# Patient Record
Sex: Female | Born: 1980 | Race: Black or African American | Hispanic: No | Smoking: Former smoker
Health system: Southern US, Community
[De-identification: ages and names within clinical notes are randomized; demographics above are authoritative.]

## PROBLEM LIST (undated history)

## (undated) DIAGNOSIS — M1990 Unspecified osteoarthritis, unspecified site: Secondary | ICD-10-CM

## (undated) DIAGNOSIS — F32A Depression, unspecified: Secondary | ICD-10-CM

## (undated) DIAGNOSIS — F419 Anxiety disorder, unspecified: Secondary | ICD-10-CM

## (undated) DIAGNOSIS — F329 Major depressive disorder, single episode, unspecified: Secondary | ICD-10-CM

## (undated) DIAGNOSIS — R3915 Urgency of urination: Secondary | ICD-10-CM

## (undated) HISTORY — DX: Unspecified osteoarthritis, unspecified site: M19.90

## (undated) HISTORY — DX: Urgency of urination: R39.15

## (undated) HISTORY — DX: Major depressive disorder, single episode, unspecified: F32.9

## (undated) HISTORY — DX: Depression, unspecified: F32.A

## (undated) HISTORY — PX: NO PAST SURGERIES: SHX2092

## (undated) HISTORY — PX: OTHER SURGICAL HISTORY: SHX169

---

## 2005-06-10 ENCOUNTER — Emergency Department: Payer: Self-pay | Admitting: Emergency Medicine

## 2005-06-17 ENCOUNTER — Emergency Department: Payer: Self-pay | Admitting: Emergency Medicine

## 2006-02-05 ENCOUNTER — Ambulatory Visit: Payer: Self-pay | Admitting: *Deleted

## 2013-10-07 ENCOUNTER — Emergency Department: Payer: Self-pay | Admitting: Internal Medicine

## 2014-03-04 LAB — HM PAP SMEAR: HM PAP: NEGATIVE

## 2015-09-02 ENCOUNTER — Encounter: Payer: Self-pay | Admitting: Urology

## 2015-09-02 ENCOUNTER — Ambulatory Visit (INDEPENDENT_AMBULATORY_CARE_PROVIDER_SITE_OTHER): Payer: BLUE CROSS/BLUE SHIELD | Admitting: Urology

## 2015-09-02 VITALS — BP 121/83 | HR 77 | Ht 62.0 in | Wt 151.0 lb

## 2015-09-02 DIAGNOSIS — N3941 Urge incontinence: Secondary | ICD-10-CM

## 2015-09-02 DIAGNOSIS — R3129 Other microscopic hematuria: Secondary | ICD-10-CM

## 2015-09-02 DIAGNOSIS — M6289 Other specified disorders of muscle: Secondary | ICD-10-CM

## 2015-09-02 DIAGNOSIS — N8184 Pelvic muscle wasting: Secondary | ICD-10-CM | POA: Diagnosis not present

## 2015-09-02 LAB — MICROSCOPIC EXAMINATION
Bacteria, UA: NONE SEEN
WBC UA: NONE SEEN /HPF (ref 0–?)

## 2015-09-02 LAB — URINALYSIS, COMPLETE
Bilirubin, UA: NEGATIVE
GLUCOSE, UA: NEGATIVE
Leukocytes, UA: NEGATIVE
NITRITE UA: NEGATIVE
Protein, UA: NEGATIVE
Specific Gravity, UA: 1.02 (ref 1.005–1.030)
UUROB: 0.2 mg/dL (ref 0.2–1.0)
pH, UA: 6.5 (ref 5.0–7.5)

## 2015-09-02 LAB — BLADDER SCAN AMB NON-IMAGING

## 2015-09-02 MED ORDER — MIRABEGRON ER 25 MG PO TB24: 25.0000 mg | ORAL_TABLET | Freq: Every day | ORAL | Status: DC

## 2015-09-02 NOTE — Progress Notes (Signed)
09/02/2015 10:44 AM   Cindy QuestMicaela Bonilla 05/15/1981 [id number]  Referring provider: No referring provider defined for this encounter.  Chief Complaint  Patient presents with  . Urge Incontinence    New Patient    HPI: 35 yo who presents today for further evaluation/treatment of urinary urgency, frequency, and difficulty urinating. She was treated by her primary care physician started approximately one year ago with oxybutynin for urinary urgency and frequency which she continues today. This has helped significantly with those issues and she rarely has issues with this now.  Her primary complaint today includes inability to start her urinary stream and empty her bladder at times. She specifies, this is primarily when she is away from home and gets distracted by noises or other people in public bathrooms. She becomes quite anxious about this. She sometimes has hesitancy and can spend up to half an hour in the bathroom at times waiting to start her stream.  This especially problematic at work. Once she is able to void, she does feel like she is able to empty her bladder. She reports that this is been going on for quite some time and prior to starting oxybutynin.  She does have issues with dry mouth and constipation from the oxybutynin. She does have soft, bulky stools but not every day. She tries to drink enough water.  No history of urinary tract infections or gross hematuria. No dysuria.  She is sexually active and no pain with intercourse. No history of bulging or perineal pressure. She has had one normal spontaneous vaginal delivery.  She has been told that she's had blood in her urine on analysis in the past but records aren't available today.  She does describe herself as a anxious, "type A' personality type. She likes to be in control and    PMH: Past Medical History  Diagnosis Date  . Arthritis     Surgical History: Past Surgical History  Procedure Laterality Date  . None        Home Medications:    Medication List       This list is accurate as of: 09/02/15 11:59 PM.  Always use your most recent med list.               CRYSELLE-28 PO  Take by mouth.     diclofenac 75 MG EC tablet  Commonly known as:  VOLTAREN     methylPREDNISolone 4 MG Tbpk tablet  Commonly known as:  MEDROL DOSEPAK  take as directed ON DOSE PACK.     mirabegron ER 25 MG Tb24 tablet  Commonly known as:  MYRBETRIQ  Take 1 tablet (25 mg total) by mouth daily.     oxybutynin 5 MG tablet  Commonly known as:  DITROPAN  Take 5 mg by mouth 3 (three) times daily.        Allergies: No Known Allergies  Family History: Family History  Problem Relation Age of Onset  . Bladder Cancer Neg Hx   . Prostate cancer Neg Hx   . Kidney cancer Neg Hx     Social History:  reports that she has been smoking.  She does not have any smokeless tobacco history on file. She reports that she drinks alcohol. She reports that she does not use illicit drugs.  ROS: UROLOGY Frequent Urination?: Yes Hard to postpone urination?: Yes Burning/pain with urination?: No Get up at night to urinate?: No Leakage of urine?: Yes Urine stream starts and stops?: Yes Trouble starting stream?: Yes  Do you have to strain to urinate?: Yes Blood in urine?: Yes Urinary tract infection?: No Sexually transmitted disease?: No Injury to kidneys or bladder?: No Painful intercourse?: No Weak stream?: No Currently pregnant?: No Vaginal bleeding?: Yes Last menstrual period?: 08/19/15  Gastrointestinal Nausea?: No Vomiting?: No Indigestion/heartburn?: No Diarrhea?: No Constipation?: No  Constitutional Fever: No Night sweats?: No Weight loss?: No Fatigue?: No  Skin Skin rash/lesions?: No Itching?: No  Eyes Blurred vision?: No Double vision?: No  Ears/Nose/Throat Sore throat?: No Sinus problems?: No  Hematologic/Lymphatic Swollen glands?: No Easy bruising?: No  Cardiovascular Leg swelling?:  Yes Chest pain?: No  Respiratory Cough?: No Shortness of breath?: No  Endocrine Excessive thirst?: Yes  Musculoskeletal Back pain?: Yes Joint pain?: Yes  Neurological Headaches?: No Dizziness?: No  Psychologic Depression?: No Anxiety?: No  Physical Exam: BP 121/83 mmHg  Pulse 77  Ht  (1.575 m)  Wt 151 lb (68.493 kg)  BMI 27.61 kg/m2  LMP 08/19/2015 (Approximate)  Constitutional:  Alert and oriented, No acute distress. HEENT: [place] AT, moist mucus membranes.  Trachea midline, no masses. Cardiovascular: No clubbing, cyanosis, or edema. Respiratory: Normal respiratory effort, no increased work of breathing. GI: Abdomen is soft, nontender, nondistended, no abdominal masses GU: No CVA tenderness.  Skin: No rashes, bruises or suspicious lesions. Lymph: No cervical or inguinal adenopathy. Neurologic: Grossly intact, no focal deficits, moving all 4 extremities. Psychiatric: Normal mood and affect.  Laboratory Data: N/a  Urinalysis Results for orders placed or performed in visit on 09/02/15  Microscopic Examination  Result Value Ref Range   WBC, UA None seen 0 -  5 /hpf   RBC, UA 3-10 (A) 0 -  2 /hpf   Epithelial Cells (non renal) 0-10 0 - 10 /hpf   Bacteria, UA None seen None seen/Few  Urinalysis, Complete  Result Value Ref Range   Specific Gravity, UA 1.020 1.005 - 1.030   pH, UA 6.5 5.0 - 7.5   Color, UA Yellow Yellow   Appearance Ur Clear Clear   Leukocytes, UA Negative Negative   Protein, UA Negative Negative/Trace   Glucose, UA Negative Negative   Ketones, UA Trace (A) Negative   RBC, UA Trace (A) Negative   Bilirubin, UA Negative Negative   Urobilinogen, Ur 0.2 0.2 - 1.0 mg/dL   Nitrite, UA Negative Negative   Microscopic Examination See below:   BLADDER SCAN AMB NON-IMAGING  Result Value Ref Range   Scan Result 12ml     Assessment & Plan:    1. Pelvic floor dysfunction Based on her symptoms including difficulty starting her urinary stream and  pelvic, I suspect she has some degree of pelvic floor dysfunction. I would like to refer her to physical therapy for pelvic floor evaluation and treatment. We discussed this at length today. She is agreeable with this plan. - Ambulatory referral to Physical Therapy  2. Urge urinary incontinence Improvement with anticholinergics but side effects including dry mouth and constipation. Will change medication tip Mybetriq with dissimilar side effect profile.  No evidence of incomplete bladder emptying. - Urinalysis, Complete - BLADDER SCAN AMB NON-IMAGING  3. Microscopic hematuria Incidental microscopic hematuria and young female but does have smoking history.  Will recommend repeating a UA to confirm presence and consider hematuria workup thereafter pending results.  -repeat UA   Return if symptoms worsen or fail to improve.  Vanna Scotland, MD  [facility] [address], [address] [place], [place] [address] [phone]

## 2015-09-04 ENCOUNTER — Telehealth: Payer: Self-pay | Admitting: Urology

## 2015-09-04 NOTE — Telephone Encounter (Signed)
Please let this patient knows that she did have some incidental microscopic hematuria and her urine on Friday. I would like to have her stop by and drop off another urine to confirm the presence of this. If it remains present in her urine on 2 separate occasions and the absence of infection, I will recommend proceeding with a hematuria workup which will include cystoscopy and CT scan.  Vanna ScotlandAshley Elisa Sorlie, MD

## 2015-09-05 NOTE — Telephone Encounter (Signed)
Left pt mess/SW 

## 2015-09-06 ENCOUNTER — Ambulatory Visit
Admission: RE | Admit: 2015-09-06 | Discharge: 2015-09-06 | Disposition: A | Discharge status: Home / Self Care | Payer: BLUE CROSS/BLUE SHIELD | Source: Ambulatory Visit | Attending: Urology | Admitting: Urology

## 2015-09-06 ENCOUNTER — Encounter: Payer: Self-pay | Admitting: Urology

## 2015-09-06 ENCOUNTER — Telehealth: Payer: Self-pay

## 2015-09-06 ENCOUNTER — Ambulatory Visit (INDEPENDENT_AMBULATORY_CARE_PROVIDER_SITE_OTHER): Payer: BLUE CROSS/BLUE SHIELD

## 2015-09-06 DIAGNOSIS — R3989 Other symptoms and signs involving the genitourinary system: Secondary | ICD-10-CM

## 2015-09-06 DIAGNOSIS — N3289 Other specified disorders of bladder: Secondary | ICD-10-CM | POA: Diagnosis not present

## 2015-09-06 DIAGNOSIS — R3129 Other microscopic hematuria: Secondary | ICD-10-CM | POA: Diagnosis present

## 2015-09-06 DIAGNOSIS — R32 Unspecified urinary incontinence: Secondary | ICD-10-CM | POA: Diagnosis not present

## 2015-09-06 LAB — MICROSCOPIC EXAMINATION
BACTERIA UA: NONE SEEN
WBC UA: NONE SEEN /HPF (ref 0–?)

## 2015-09-06 LAB — URINALYSIS, COMPLETE
Bilirubin, UA: NEGATIVE
Glucose, UA: NEGATIVE
Ketones, UA: NEGATIVE
Leukocytes, UA: NEGATIVE
NITRITE UA: NEGATIVE
PH UA: 6 (ref 5.0–7.5)
Protein, UA: NEGATIVE
SPEC GRAV UA: 1.015 (ref 1.005–1.030)
UUROB: 0.2 mg/dL (ref 0.2–1.0)

## 2015-09-06 LAB — BLADDER SCAN AMB NON-IMAGING: Scan Result: 0

## 2015-09-06 NOTE — Telephone Encounter (Signed)
Patient dropped off urine at office today after PVR

## 2015-09-06 NOTE — Telephone Encounter (Signed)
Spoke with patient per Dr. Apolinar JunesBrandon and notified her that her UA today still showed 3-10 RBC and we would need to do a hematuria evaluation which would include a CTscan and cysto to follow. Patient states she understands but is having worsening pain in her kidney area. Her UA today showed no sign of infection per Dr. Apolinar JunesBrandon patient should go for a KUB to rule out possible stone. Patient is in agreeance with this plan and we will call with KUB results.

## 2015-09-06 NOTE — Progress Notes (Signed)
Bladder Scan Patient can void:  Performed By: Rupert Stackshelsea Redonna Wilbert, LPN  Pt called c/o increased urinary frequency since starting myrbetriq. Pt c/o having to go to the bathroom every 15 min. Made pt aware would send Dr. Apolinar JunesBrandon a message and get back in touch with her. Pt voiced understanding.

## 2015-09-07 ENCOUNTER — Telehealth: Payer: Self-pay

## 2015-09-07 DIAGNOSIS — R3129 Other microscopic hematuria: Secondary | ICD-10-CM

## 2015-09-07 NOTE — Telephone Encounter (Signed)
No. Pt had a KUB to rule out kidney stones.

## 2015-09-07 NOTE — Telephone Encounter (Signed)
-----   Message from Harle BattiestShannon A McGowan, PA-C sent at 09/07/2015 10:28 AM EDT ----- Patient has microscopic hematuria again on this repeated UA.  She does also have yeast which could be a contaminate.  Is she having any vaginal itching or discharge?

## 2015-09-07 NOTE — Telephone Encounter (Signed)
Cindy SagoSarah spoke with pt in reference to KUB results.

## 2015-09-07 NOTE — Telephone Encounter (Signed)
I have made these appointments for her and she has been notified  michelle

## 2015-09-07 NOTE — Telephone Encounter (Signed)
-----   Message from Vanna ScotlandAshley Brandon, MD sent at 09/06/2015  5:09 PM EDT ----- No stones.  She does look a little constipated. Please let her know.  Vanna ScotlandAshley Brandon, MD

## 2015-09-16 ENCOUNTER — Ambulatory Visit
Admission: RE | Admit: 2015-09-16 | Discharge: 2015-09-16 | Disposition: A | Discharge status: Home / Self Care | Payer: BLUE CROSS/BLUE SHIELD | Source: Ambulatory Visit | Attending: Urology | Admitting: Urology

## 2015-09-16 ENCOUNTER — Telehealth: Payer: Self-pay | Admitting: Urology

## 2015-09-16 ENCOUNTER — Encounter: Payer: Self-pay | Admitting: Urology

## 2015-09-16 DIAGNOSIS — R3129 Other microscopic hematuria: Secondary | ICD-10-CM | POA: Insufficient documentation

## 2015-09-16 MED ORDER — IOPAMIDOL (ISOVUE-300) INJECTION 61%
150.0000 mL | Freq: Once | INTRAVENOUS | Status: AC | PRN
  Administered 2015-09-16: 150 mL via INTRAVENOUS

## 2015-09-16 NOTE — Telephone Encounter (Signed)
-----   Message from Vanna ScotlandAshley Brandon, MD sent at 09/16/2015  2:09 PM EDT ----- Please note this patient noted that her CT scan looks good. We'll see her in a few weeks for her cystoscopy.  Vanna ScotlandAshley Brandon, MD

## 2015-09-16 NOTE — Telephone Encounter (Signed)
Left pt mess to call/SW 

## 2015-09-16 NOTE — Telephone Encounter (Signed)
Pt came in asking for work note excusing her from 3/22-3/24 due to medication making her sick.  I spoke with Dr. Apolinar JunesBrandon, who advised pt to discontinue all medications and come in 4/7 as scheduled for her cysto.  I gave pt note excusing her from work on the requested dates.  NO more work notes should be given per Dr. Apolinar JunesBrandon!  TL

## 2015-09-18 NOTE — Telephone Encounter (Signed)
I like to check an UA and culture again to see if the hematuria has resolved.

## 2015-09-19 NOTE — Telephone Encounter (Signed)
Spoke with pt in reference to u/a and ucx. Pt stated that she will come by the office on Wednesday morning to give another urine. Orders placed.

## 2015-09-19 NOTE — Telephone Encounter (Signed)
Patient notified/SW 

## 2015-09-19 NOTE — Addendum Note (Signed)
Addended by: Skeet LatchWATKINS, Azul Coffie C on: 09/19/2015 03:27 PM   Modules accepted: Orders

## 2015-09-21 ENCOUNTER — Other Ambulatory Visit: Payer: BLUE CROSS/BLUE SHIELD

## 2015-09-21 DIAGNOSIS — R3129 Other microscopic hematuria: Secondary | ICD-10-CM

## 2015-09-21 LAB — URINALYSIS, COMPLETE
BILIRUBIN UA: NEGATIVE
Glucose, UA: NEGATIVE
Ketones, UA: NEGATIVE
LEUKOCYTES UA: NEGATIVE
Nitrite, UA: NEGATIVE
PH UA: 5 (ref 5.0–7.5)
PROTEIN UA: NEGATIVE
Specific Gravity, UA: <1.005 — ABNORMAL LOW (ref 1.005–1.030)
Urobilinogen, Ur: 0.2 mg/dL (ref 0.2–1.0)

## 2015-09-21 LAB — MICROSCOPIC EXAMINATION
BACTERIA UA: NONE SEEN
Epithelial Cells (non renal): NONE SEEN /hpf (ref 0–10)
WBC, UA: NONE SEEN /hpf (ref 0–?)

## 2015-09-23 ENCOUNTER — Other Ambulatory Visit: Payer: BLUE CROSS/BLUE SHIELD | Admitting: Urology

## 2015-09-23 ENCOUNTER — Other Ambulatory Visit: Payer: BLUE CROSS/BLUE SHIELD

## 2015-09-23 LAB — CULTURE, URINE COMPREHENSIVE

## 2015-09-28 ENCOUNTER — Encounter: Payer: Self-pay | Admitting: Family Medicine

## 2015-09-28 ENCOUNTER — Encounter: Payer: Self-pay | Admitting: *Deleted

## 2015-09-28 ENCOUNTER — Ambulatory Visit (INDEPENDENT_AMBULATORY_CARE_PROVIDER_SITE_OTHER): Payer: BLUE CROSS/BLUE SHIELD | Admitting: Family Medicine

## 2015-09-28 VITALS — BP 110/70 | HR 71 | Temp 98.8°F | Resp 16 | Wt 150.0 lb

## 2015-09-28 DIAGNOSIS — R55 Syncope and collapse: Secondary | ICD-10-CM

## 2015-09-28 DIAGNOSIS — L02811 Cutaneous abscess of head [any part, except face]: Secondary | ICD-10-CM | POA: Diagnosis not present

## 2015-09-28 MED ORDER — ACETAMINOPHEN 500 MG PO TABS: 500.0000 mg | ORAL_TABLET | Freq: Four times a day (QID) | ORAL | Status: DC | PRN

## 2015-09-28 MED ORDER — DOXYCYCLINE HYCLATE 100 MG PO TABS: 100.0000 mg | ORAL_TABLET | Freq: Two times a day (BID) | ORAL | Status: DC

## 2015-09-28 NOTE — Patient Instructions (Addendum)
Monday 1030 Am at [facility]. Be there at 10am to do paper work.   Take doxycycline twice daily for 10 days to treat infection. Keep using warm compresses to help it drain.

## 2015-09-28 NOTE — Progress Notes (Signed)
Subjective:    Patient ID: Cindy Bonilla, female    DOB: 07-Aug-1980, 35 y.o.   MRN: [id number]  HPI: Cindy Bonilla is a 35 y.o. female presenting on 09/28/2015 for Recurrent Skin Infections   HPI  Pt presents as new patient for a sick visit. She has a boil on the R side of top of her head. Draining purulent fluid- 2-3 days. Getting larger. Very painful. Know having knot and pain behind her ear. Started as a pimple. Has never had symptoms like this before.   Previous provider was [facility]- She was seeing them for Urge Incontinence [facility] but her on oxybutynin for bladder issues. Urinary urgency. She did a see Dr.Brandon at [facility]. See was placed on mybetriq. She reports she faint at work like she would pass out x2 last week. Felt dizzy and short of breath prior to passing out. Did not hit her head. She thought it was medications. Stopped all medications and now feels better. No further episodes of syncope.  Records from [facility] have been requested and will be reviewed.   Past Medical History  Diagnosis Date  . Arthritis   . Urinary urgency   . Depression    Social History   Social History  . Marital Status: Single    Spouse Name: N/A  . Number of Children: N/A  . Years of Education: N/A   Occupational History  . Not on file.   Social History Main Topics  . Smoking status: Current Some Day Smoker  . Smokeless tobacco: Never Used  . Alcohol Use: 0.0 oz/week    0 Standard drinks or equivalent per week     Comment: occasional  . Drug Use: No  . Sexual Activity: Not on file   Other Topics Concern  . Not on file   Social History Narrative   Family History  Problem Relation Age of Onset  . Bladder Cancer Neg Hx   . Prostate cancer Neg Hx   . Kidney cancer Neg Hx   . Diabetes Mother   . Depression Mother   . Aneurysm Brother    Current Outpatient Prescriptions on File Prior to Visit  Medication Sig  . Norgestrel-Ethinyl Estradiol (CRYSELLE-28 PO) Take  by mouth.   No current facility-administered medications on file prior to visit.    Review of Systems  Constitutional: Negative for fever and chills.  HENT: Negative.   Respiratory: Negative for cough, chest tightness and wheezing.   Cardiovascular: Negative for chest pain and leg swelling.  Gastrointestinal: Negative for nausea, vomiting, abdominal pain, diarrhea and constipation.  Endocrine: Negative.  Negative for cold intolerance, heat intolerance, polydipsia, polyphagia and polyuria.  Genitourinary: Positive for urgency. Negative for dysuria, flank pain and difficulty urinating.  Musculoskeletal: Negative.   Skin: Positive for wound.  Neurological: Positive for syncope. Negative for dizziness, light-headedness and numbness.  Psychiatric/Behavioral: Negative.    Per HPI unless specifically indicated above     Objective:    BP 110/70 mmHg  Pulse 71  Temp(Src) 98.8 F (37.1 C) (Oral)  Resp 16  Wt 150 lb (68.04 kg)  LMP 09/15/2015 (Exact Date)  Wt Readings from Last 3 Encounters:  09/28/15 150 lb (68.04 kg)  09/02/15 151 lb (68.493 kg)    Physical Exam  Constitutional: She is oriented to person, place, and time.  HENT:  Head: Normocephalic and atraumatic.  Neck: Normal range of motion. Neck supple.  Cardiovascular: Normal rate and regular rhythm.  Exam reveals no gallop and no  friction rub.   No murmur heard. Pulmonary/Chest: Effort normal and breath sounds normal. No respiratory distress. She has no wheezes. She has no rales.  Musculoskeletal: Normal range of motion. She exhibits no edema.  Neurological: She is alert and oriented to person, place, and time. She has normal reflexes.  Skin: Skin is warm and dry. Lesion noted. She is not diaphoretic. No pallor.  Large 4mm in diameter boil/abscess on R side of scalp within the hair line. Pustule present.   Psychiatric: She has a normal mood and affect. Her behavior is normal. Thought content normal.   Results for orders  placed or performed in visit on 09/21/15  CULTURE, URINE COMPREHENSIVE  Result Value Ref Range   Urine Culture, Comprehensive Final report    Result 1 Comment   Microscopic Examination  Result Value Ref Range   WBC, UA None seen 0 -  5 /hpf   RBC, UA 0-2 0 -  2 /hpf   Epithelial Cells (non renal) None seen 0 - 10 /hpf   Bacteria, UA None seen None seen/Few  Urinalysis, Complete  Result Value Ref Range   Specific Gravity, UA <1.005 (L) 1.005 - 1.030   pH, UA 5.0 5.0 - 7.5   Color, UA Yellow Yellow   Appearance Ur Clear Clear   Leukocytes, UA Negative Negative   Protein, UA Negative Negative/Trace   Glucose, UA Negative Negative   Ketones, UA Negative Negative   RBC, UA Trace (A) Negative   Bilirubin, UA Negative Negative   Urobilinogen, Ur 0.2 0.2 - 1.0 mg/dL   Nitrite, UA Negative Negative   Microscopic Examination See below:       Assessment & Plan:   Problem List Items Addressed This Visit    None    Visit Diagnoses    Syncope, unspecified syncope type    -  Primary    ECG WNL. Likely 2/2 medication as symptoms have resolved. Check labs. Pt to ER with further symptoms.     Relevant Orders    EKG 12-Lead    CBC with Differential    Comprehensive metabolic panel    Abscess of head        Due to location of abscess on scalp- will have surgery perfrom I&D.  warm compresses. Start doxy BID for 10 days.     Relevant Medications    doxycycline (VIBRA-TABS) 100 MG tablet    acetaminophen (TYLENOL) 500 MG tablet    Other Relevant Orders    Ambulatory referral to General Surgery       Meds ordered this encounter  Medications  . doxycycline (VIBRA-TABS) 100 MG tablet    Sig: Take 1 tablet (100 mg total) by mouth 2 (two) times daily.    Dispense:  20 tablet    Refill:  0    Order Specific Question:  Supervising Provider    Answer:  Janeann Forehand [phone]  . acetaminophen (TYLENOL) 500 MG tablet    Sig: Take 1 tablet (500 mg total) by mouth every 6 (six) hours as  needed.    Dispense:  30 tablet    Refill:  0    Order Specific Question:  Supervising Provider    Answer:  Janeann Forehand [[id number]]      Follow up plan: Return in about 2 weeks (around 10/12/2015), or if symptoms worsen or fail to improve, for keep previously scheduled appt. Marland Kitchen

## 2015-09-30 ENCOUNTER — Other Ambulatory Visit: Payer: BLUE CROSS/BLUE SHIELD

## 2015-09-30 ENCOUNTER — Ambulatory Visit (INDEPENDENT_AMBULATORY_CARE_PROVIDER_SITE_OTHER): Payer: BLUE CROSS/BLUE SHIELD | Admitting: Urology

## 2015-09-30 ENCOUNTER — Encounter: Payer: Self-pay | Admitting: Urology

## 2015-09-30 VITALS — BP 113/77 | HR 85 | Ht 62.0 in | Wt 147.6 lb

## 2015-09-30 DIAGNOSIS — N8184 Pelvic muscle wasting: Secondary | ICD-10-CM

## 2015-09-30 DIAGNOSIS — R32 Unspecified urinary incontinence: Secondary | ICD-10-CM | POA: Diagnosis not present

## 2015-09-30 DIAGNOSIS — R3129 Other microscopic hematuria: Secondary | ICD-10-CM

## 2015-09-30 DIAGNOSIS — K5909 Other constipation: Secondary | ICD-10-CM | POA: Diagnosis not present

## 2015-09-30 DIAGNOSIS — M6289 Other specified disorders of muscle: Secondary | ICD-10-CM

## 2015-09-30 LAB — MICROSCOPIC EXAMINATION
BACTERIA UA: NONE SEEN
WBC, UA: NONE SEEN /hpf (ref 0–?)

## 2015-09-30 LAB — URINALYSIS, COMPLETE
BILIRUBIN UA: NEGATIVE
Glucose, UA: NEGATIVE
Ketones, UA: NEGATIVE
LEUKOCYTES UA: NEGATIVE
Nitrite, UA: NEGATIVE
PH UA: 5.5 (ref 5.0–7.5)
Protein, UA: NEGATIVE
SPEC GRAV UA: 1.01 (ref 1.005–1.030)
Urobilinogen, Ur: 0.2 mg/dL (ref 0.2–1.0)

## 2015-09-30 MED ORDER — CIPROFLOXACIN HCL 500 MG PO TABS
500.0000 mg | ORAL_TABLET | Freq: Once | ORAL | Status: AC
  Administered 2015-09-30: 500 mg via ORAL

## 2015-09-30 MED ORDER — LIDOCAINE HCL 2 % EX GEL
1.0000 "application " | Freq: Once | CUTANEOUS | Status: AC
  Administered 2015-09-30: 1 via URETHRAL

## 2015-09-30 NOTE — Progress Notes (Signed)
10:54 AM  09/30/2015   Cindy Bonilla 09/21/1980 [id number]  Referring provider: [facility] [address] [place], [place] [address]  Chief Complaint  Patient presents with  . Cysto    HPI: 35 yo female with urinary frequency/urgency as well as microscopic hematuria. She returns today for office cystoscopy to complete her hematuria workup. CT urogram shows no evidence of stones, ureteral lesions, filling defects, or any other GU pathology.   She was previously on ditropan which caused dry eyes  and constipation. She was switched to Mybetriq which made her "sick"  with worsening of her urinary urgency/frequency and feeling like she wanted to pass out.  S he was referred for pelvic PT given concern for possible pelvic floor dysfunction in the setting of shy bladder, difficulty voiding at times , inability to start her stream /hesitancy.   She has not yet seen them, canceled her follow-up due to other ongoing medical issues.   she has been using an over-the-counter herbal agent for constipation. She states she is going regularly.     Today, she has no complaints.  PMH: Past Medical History  Diagnosis Date  . Arthritis   . Urinary urgency   . Depression     Surgical History: Past Surgical History  Procedure Laterality Date  . None      Home Medications:    Medication List       This list is accurate as of: 09/30/15 10:54 AM.  Always use your most recent med list.               acetaminophen 500 MG tablet  Commonly known as:  TYLENOL  Take 1 tablet (500 mg total) by mouth every 6 (six) hours as needed.     CRYSELLE-28 PO  Take by mouth.     doxycycline 100 MG tablet  Commonly known as:  VIBRA-TABS  Take 1 tablet (100 mg total) by mouth 2 (two) times daily.        Allergies:  Allergies  Allergen Reactions  . Motrin [Ibuprofen] Other (See Comments)    Rectal bleeding.  . Latex Rash    Family History: Family History  Problem  Relation Age of Onset  . Bladder Cancer Neg Hx   . Prostate cancer Neg Hx   . Kidney cancer Neg Hx   . Diabetes Mother   . Depression Mother   . Aneurysm Brother     Social History:  reports that she has been smoking.  She has never used smokeless tobacco. She reports that she drinks alcohol. She reports that she does not use illicit drugs.   Physical Exam: BP 113/77 mmHg  Pulse 85  Ht  (1.575 m)  Wt 147 lb 9.6 oz (66.951 kg)  BMI 26.99 kg/m2  LMP 09/15/2015 (Exact Date)  Constitutional:  Alert and oriented, No acute distress. HEENT: [place] AT, moist mucus membranes.  Trachea midline, no masses. Cardiovascular: No clubbing, cyanosis, or edema. Respiratory: Normal respiratory effort, no increased work of breathing. GI: Abdomen is soft, nontender, nondistended, no abdominal masses GU: No CVA tenderness.    Normal external genitalia. Normal urethral meatus. Skin: No rashes, bruises or suspicious lesions. Neurologic: Grossly intact, no focal deficits, moving all 4 extremities. Psychiatric: Normal mood and affect.  Laboratory Data: N/a  Urinalysis Results for orders placed or performed in visit on 09/21/15  CULTURE, URINE COMPREHENSIVE  Result Value Ref Range   Urine Culture, Comprehensive Final report    Result  1 Comment   Microscopic Examination  Result Value Ref Range   WBC, UA None seen 0 -  5 /hpf   RBC, UA 0-2 0 -  2 /hpf   Epithelial Cells (non renal) None seen 0 - 10 /hpf   Bacteria, UA None seen None seen/Few  Urinalysis, Complete  Result Value Ref Range   Specific Gravity, UA <1.005 (L) 1.005 - 1.030   pH, UA 5.0 5.0 - 7.5   Color, UA Yellow Yellow   Appearance Ur Clear Clear   Leukocytes, UA Negative Negative   Protein, UA Negative Negative/Trace   Glucose, UA Negative Negative   Ketones, UA Negative Negative   RBC, UA Trace (A) Negative   Bilirubin, UA Negative Negative   Urobilinogen, Ur 0.2 0.2 - 1.0 mg/dL   Nitrite, UA Negative Negative    Microscopic Examination See below:    Imaging    Study Result     CLINICAL DATA: Microhematuria. Urinary urgency and frequency. Difficulty initiating micturition.  EXAM: CT ABDOMEN AND PELVIS WITHOUT AND WITH CONTRAST  TECHNIQUE: Multidetector CT imaging of the abdomen and pelvis was performed following the standard protocol before and following the bolus administration of intravenous contrast.  CONTRAST: ISOVUE-300 IOPAMIDOL (ISOVUE-300) INJECTION 61%  COMPARISON: 09/06/2015 abdominal radiographs. No prior CT abdomen/pelvis.  FINDINGS: Lower chest: No significant pulmonary nodules or acute consolidative airspace disease.  Hepatobiliary: Normal liver with no liver mass. Normal gallbladder with normal variant Phrygian cap and no radiopaque cholelithiasis. No biliary ductal dilatation.  Pancreas: Normal, with no mass or duct dilation.  Spleen: Normal size. No mass.  Adrenals/Urinary Tract: Normal adrenals. No renal stones. No hydronephrosis. Normal caliber ureters, with no ureteral stones. No bladder stones. No renal cortical masses. On delayed imaging, there is no urothelial wall thickening and there are no filling defects in the opacified portions of the bilateral collecting systems or ureters. No bladder mass. No bladder diverticula. No bladder wall thickening.  Stomach/Bowel: Grossly normal stomach. Normal caliber small bowel with no small bowel wall thickening. Appendix not discretely visualized. Normal large bowel with no diverticulosis, large bowel wall thickening or pericolonic fat stranding.  Vascular/Lymphatic: Normal caliber abdominal aorta. Patent portal, splenic, hepatic and renal veins. No pathologically enlarged lymph nodes in the abdomen or pelvis.  Reproductive: Grossly normal uterus. No adnexal mass.  Other: No pneumoperitoneum, ascites or focal fluid collection.  Musculoskeletal: No aggressive appearing focal osseous  lesions.  IMPRESSION: Normal CT of the abdomen and pelvis. No urolithiasis. No evidence of urinary tract obstruction. No renal cortical masses. No urothelial lesions.   Electronically Signed  By: Delbert Phenix M.D.  On: 09/16/2015 13:18   Reviewed personally today   Cystoscopy Procedure Note  Patient identification was confirmed, informed consent was obtained, and patient was prepped using Betadine solution.  Lidocaine jelly was administered per urethral meatus.    Preoperative abx where received prior to procedure.    Procedure: - Flexible cystoscope introduced, without any difficulty.   - Thorough search of the bladder revealed:    normal urethral meatus    normal urothelium    no stones    no ulcers     no tumors    no urethral polyps    no trabeculation   Peristalsis from bowels and colonic impression seen on anterior and right lateral wall of bladder. Uterine impression also seen posteriorly.    - Ureteral orifices were normal in position and appearance.  Post-Procedure: - Patient tolerated the procedure well  Assessment & Plan:    1. Microscopic hematuria  status post negative cystoscopy, hematuria workup including negative CTU and cystoscopy today  Viewed results with the patient , reassured - lidocaine (XYLOCAINE) 2 % jelly 1 application; Place 1 application into the urethra once. - ciprofloxacin (CIPRO) tablet 500 mg; Take 1 tablet (500 mg total) by mouth once.  2. Urinary incontinence, unspecified incontinence type  unable to tolerate Ditropan an Mybetriq  Not interested in further medical therapy at this time due to side effects from medications  3. Pelvic floor dysfunction continue to recommend pelvic floor therapy as I think that she would benefit from this  4. Other constipation  discussed the importance of bowel hygiene and avoidance of constipation as this can be contributing to her urinary symptoms  Advised patient to consider drinking half  a bottle of mag citrate for bowel cleanout followed by maintenance   Return if symptoms worsen or fail to improve.  Vanna ScotlandAshley Jaysean Manville, MD  [facility] [address], [address] [place], [place] [id number] [phone]

## 2015-10-03 ENCOUNTER — Encounter: Payer: Self-pay | Admitting: General Surgery

## 2015-10-03 ENCOUNTER — Ambulatory Visit: Payer: BLUE CROSS/BLUE SHIELD | Admitting: General Surgery

## 2015-10-03 VITALS — BP 118/74 | HR 84 | Resp 12 | Ht 62.0 in | Wt 150.0 lb

## 2015-10-03 DIAGNOSIS — L089 Local infection of the skin and subcutaneous tissue, unspecified: Secondary | ICD-10-CM

## 2015-10-03 DIAGNOSIS — L729 Follicular cyst of the skin and subcutaneous tissue, unspecified: Principal | ICD-10-CM

## 2015-10-03 NOTE — Patient Instructions (Signed)
Needs to have checked in about 6 weeks, sooner if problems arise.

## 2015-10-03 NOTE — Progress Notes (Signed)
Patient ID: Cindy Bonilla, female   DOB: April 29, 1981, 35 y.o.   MRN: [id number]  Chief Complaint  Patient presents with  . Other    boil on head    HPI Cindy Bonilla is a 35 y.o. female here today for a evaluation of a boil on right side of head . Patient states she noticed this area about  a week ago.  Has gone down very much now. It drained some yellow pus. She says she has another knot behind the rt ear. This also came up at same time. No similar problems in the past. I have reviewed the history of present illness with the patient.   HPI  Past Medical History  Diagnosis Date  . Arthritis   . Urinary urgency   . Depression     Past Surgical History  Procedure Laterality Date  . None      Family History  Problem Relation Age of Onset  . Bladder Cancer Neg Hx   . Prostate cancer Neg Hx   . Kidney cancer Neg Hx   . Diabetes Mother   . Depression Mother   . Aneurysm Brother     Social History Social History  Substance Use Topics  . Smoking status: Current Some Day Smoker  . Smokeless tobacco: Never Used  . Alcohol Use: 0.0 oz/week    0 Standard drinks or equivalent per week     Comment: occasional    Allergies  Allergen Reactions  . Motrin [Ibuprofen] Other (See Comments)    Rectal bleeding.  . Latex Rash    Current Outpatient Prescriptions  Medication Sig Dispense Refill  . doxycycline (VIBRA-TABS) 100 MG tablet Take 1 tablet (100 mg total) by mouth 2 (two) times daily. 20 tablet 0  . Norgestrel-Ethinyl Estradiol (CRYSELLE-28 PO) Take by mouth.    Marland Kitchen. acetaminophen (TYLENOL) 500 MG tablet Take 1 tablet (500 mg total) by mouth every 6 (six) hours as needed. (Patient not taking: Reported on 09/30/2015) 30 tablet 0   No current facility-administered medications for this visit.    Review of Systems Review of Systems  Constitutional: Negative.   Respiratory: Negative.   Cardiovascular: Negative.     Blood pressure 118/74, pulse 84, resp. rate 12, height 5'  2" (1.575 m), weight 150 lb (68.04 kg), last menstrual period 09/15/2015.  Physical Exam Physical Exam  Constitutional: She appears well-developed and well-nourished.  Eyes: Conjunctivae are normal.  Neck: Neck supple.  Lymphadenopathy:    She has no cervical adenopathy.       Right: No supraclavicular adenopathy present.       Left: No supraclavicular adenopathy present.  Skin:       Data Reviewed Notes reviewed  Assessment    Healing abscess/infected cyst on right front of head.  Cyst behind right ear.    Plan    Needs to have checked in about 5 weeks, sooner if problems arise. Pt advised that if there is any residual palpable mass/cyst it is best to have it removed. Pt states she will see her PCP in 5 weeks     [facility]  This information has been scribed by Roselee CulverMichael Hales,LPN.    SANKAR,SEEPLAPUTHUR G 10/03/2015, 1:52 PM

## 2015-10-06 ENCOUNTER — Encounter: Payer: Self-pay | Admitting: General Surgery

## 2015-10-07 ENCOUNTER — Ambulatory Visit: Payer: BLUE CROSS/BLUE SHIELD | Admitting: Physical Therapy

## 2015-10-10 ENCOUNTER — Ambulatory Visit (INDEPENDENT_AMBULATORY_CARE_PROVIDER_SITE_OTHER): Payer: BLUE CROSS/BLUE SHIELD | Admitting: Family Medicine

## 2015-10-10 ENCOUNTER — Encounter: Payer: Self-pay | Admitting: Family Medicine

## 2015-10-10 VITALS — BP 119/85 | HR 102 | Temp 99.4°F | Resp 16 | Ht 62.0 in | Wt 148.6 lb

## 2015-10-10 DIAGNOSIS — R109 Unspecified abdominal pain: Secondary | ICD-10-CM | POA: Insufficient documentation

## 2015-10-10 DIAGNOSIS — H9311 Tinnitus, right ear: Secondary | ICD-10-CM

## 2015-10-10 DIAGNOSIS — F329 Major depressive disorder, single episode, unspecified: Secondary | ICD-10-CM

## 2015-10-10 DIAGNOSIS — K5901 Slow transit constipation: Secondary | ICD-10-CM | POA: Diagnosis not present

## 2015-10-10 DIAGNOSIS — Z8342 Family history of familial hypercholesterolemia: Secondary | ICD-10-CM

## 2015-10-10 DIAGNOSIS — R3915 Urgency of urination: Secondary | ICD-10-CM | POA: Diagnosis not present

## 2015-10-10 DIAGNOSIS — F32A Depression, unspecified: Secondary | ICD-10-CM | POA: Insufficient documentation

## 2015-10-10 DIAGNOSIS — K5900 Constipation, unspecified: Secondary | ICD-10-CM | POA: Insufficient documentation

## 2015-10-10 DIAGNOSIS — F419 Anxiety disorder, unspecified: Secondary | ICD-10-CM | POA: Insufficient documentation

## 2015-10-10 MED ORDER — BISACODYL 5 MG PO TBEC: 5.0000 mg | DELAYED_RELEASE_TABLET | Freq: Every evening | ORAL | Status: DC | PRN

## 2015-10-10 NOTE — Assessment & Plan Note (Addendum)
Likely 2/2 IBS D.  Trial of probiotic. Continue clean out to help with urinary urgency. Add miralax daily. Consider GI referral with continued symptoms. Discussed FODMAP diet and reducing dairy/gluten to see if symptoms improve.

## 2015-10-10 NOTE — Patient Instructions (Addendum)
Constipation: Duculox can help with constipation. Take once daily at bedtime. Add back probiotic to see if that helps you get regular. Google- FODMAP diet. Try miralax to help with constipation.   Tart Cherry juice- can help with arthritis pain. Irritable Bowel Syndrome, Adult Irritable bowel syndrome (IBS) is not one specific disease. It is a group of symptoms that affects the organs responsible for digestion (gastrointestinal or GI tract).  To regulate how your GI tract works, your body sends signals back and forth between your intestines and your brain. If you have IBS, there may be a problem with these signals. As a result, your GI tract does not function normally. Your intestines may become more sensitive and overreact to certain things. This is especially true when you eat certain foods or when you are under stress.  There are four types of IBS. These may be determined based on the consistency of your stool:   IBS with diarrhea.   IBS with constipation.   Mixed IBS.   Unsubtyped IBS.  It is important to know which type of IBS you have. Some treatments are more likely to be helpful for certain types of IBS.  CAUSES  The exact cause of IBS is not known. RISK FACTORS You may have a higher risk of IBS if:  You are a woman.  You are younger than 37 years old.  You have a family history of IBS.  You have mental health problems.  You have had bacterial infection of your GI tract. SIGNS AND SYMPTOMS  Symptoms of IBS vary from person to person. The main symptom is abdominal pain or discomfort. Additional symptoms usually include one or more of the following:   Diarrhea, constipation, or both.   Abdominal swelling or bloating.   Feeling full or sick after eating a small or regular-size meal.   Frequent gas.   Mucus in the stool.   A feeling of having more stool left after a bowel movement.  Symptoms tend to come and go. They may be associated with stress, psychiatric  conditions, or nothing at all.  DIAGNOSIS  There is no specific test to diagnose IBS. Your health care provider will make a diagnosis based on a physical exam, medical history, and your symptoms. You may have other tests to rule out other conditions that may be causing your symptoms. These may include:   Blood tests.   X-rays.   CT scan.  Endoscopy and colonoscopy. This is a test in which your GI tract is viewed with a long, thin, flexible tube. TREATMENT There is no cure for IBS, but treatment can help relieve symptoms. IBS treatment often includes:   Changes to your diet, such as:  Eating more fiber.  Avoiding foods that cause symptoms.  Drinking more water.  Eating regular, medium-sized portioned meals.  Medicines. These may include:  Fiber supplements if you have constipation.  Medicine to control diarrhea (antidiarrheal medicines).  Medicine to help control muscle spasms in your GI tract (antispasmodic medicines).  Medicines to help with any mental health issues, such as antidepressants or tranquilizers.  Therapy.  Talk therapy may help with anxiety, depression, or other mental health issues that can make IBS symptoms worse.  Stress reduction.  Managing your stress can help keep symptoms under control. HOME CARE INSTRUCTIONS   Take medicines only as directed by your health care provider.  Eat a healthy diet.  Avoid foods and drinks with added sugar.  Include more whole grains, fruits, and vegetables gradually  into your diet. This may be especially helpful if you have IBS with constipation.  Avoid any foods and drinks that make your symptoms worse. These may include dairy products and caffeinated or carbonated drinks.  Do not eat large meals.  Drink enough fluid to keep your urine clear or pale yellow.  Exercise regularly. Ask your health care provider for recommendations of good activities for you.  Keep all follow-up visits as directed by your health  care provider. This is important. SEEK MEDICAL CARE IF:   You have constant pain.  You have trouble or pain with swallowing.  You have worsening diarrhea. SEEK IMMEDIATE MEDICAL CARE IF:   You have severe and worsening abdominal pain.   You have diarrhea and:   You have a rash, stiff neck, or severe headache.   You are irritable, sleepy, or difficult to awaken.   You are weak, dizzy, or extremely thirsty.   You have bright red blood in your stool or you have black tarry stools.   You have unusual abdominal swelling that is painful.   You vomit continuously.   You vomit blood (hematemesis).   You have both abdominal pain and a fever.    This information is not intended to replace advice given to you by your health care provider. Make sure you discuss any questions you have with your health care provider.   Document Released: 06/11/2005 Document Revised: 07/02/2014 Document Reviewed: 02/26/2014 Elsevier Interactive Patient Education [employer]

## 2015-10-10 NOTE — Assessment & Plan Note (Signed)
Pt would not like to take medication. Would like to try herbal remedies at this time. Check TSH, vitamin D.  Recommend fish oil OTC to help with symptoms. Daily exercise and mindfulness/meditation suggested.

## 2015-10-10 NOTE — Progress Notes (Signed)
Subjective:    Patient ID: Cindy Bonilla, female    DOB: June 26, 1980, 35 y.o.   MRN: [id number]  HPI: Cindy QuestMicaela Kovarik is a 35 y.o. female presenting on 10/10/2015 for Recurrent Skin Infections and Constipation   HPI  Pt presents for follow-up of abscess on the head, constipation, and urinary urgency.  Urinary urgency- seeing urolology. Cystoscopy- found constipation. Urinary urgency- resolving with bowel clean out. Stopped Mybetriq due to presyncope. Thought to be due to constipation.   Constipation- drank 1/2 the mag citrate. Has had issues with constipation in the past- has BM once daily but has to strain. Takes natural tea that helps her go regularly. She felts like mag citrate helped with constipation but did not have full effect. If she misses the tea- she is very constipated. Boil on scalp- Still painful. Boil is gone. No longer draining. Still having ringing in her ear. R ear only. Still has knot behind her ear. HA and ear ringing started with boil but have not resolved since this infection. Finished doxy 1 days ago.  Depression- worsened with her recent illness. Hard to get out of the house due to constipation. Is worsening her mood. No SI/ No HI. Has never been on medication. Doesn't want to take pills.  Knee arthritis- improving with PT.   Health Maintenance:  Contraception: OCPs. 1 pregnancy- 37 year old daughter.   Pap smear: Unsure last pap. All paps normal in the past.   Past Medical History  Diagnosis Date  . Arthritis   . Urinary urgency   . Depression    Social History   Social History  . Marital Status: Single    Spouse Name: N/A  . Number of Children: N/A  . Years of Education: N/A   Occupational History  . Not on file.   Social History Main Topics  . Smoking status: Former Games developermoker  . Smokeless tobacco: Never Used  . Alcohol Use: 0.0 oz/week    0 Standard drinks or equivalent per week     Comment: occasional  . Drug Use: No  . Sexual Activity: Not on  file   Other Topics Concern  . Not on file   Social History Narrative   Family History  Problem Relation Age of Onset  . Bladder Cancer Neg Hx   . Prostate cancer Neg Hx   . Kidney cancer Neg Hx   . Diabetes Mother   . Depression Mother   . Aneurysm Brother    Current Outpatient Prescriptions on File Prior to Visit  Medication Sig  . Norgestrel-Ethinyl Estradiol (CRYSELLE-28 PO) Take by mouth.   No current facility-administered medications on file prior to visit.    Review of Systems  Constitutional: Negative for fever and chills.  HENT: Positive for tinnitus.        Tinnitus   Respiratory: Negative for cough, chest tightness and wheezing.   Cardiovascular: Negative for chest pain and leg swelling.  Gastrointestinal: Positive for constipation. Negative for nausea, vomiting, abdominal pain and diarrhea.  Endocrine: Negative.  Negative for cold intolerance, heat intolerance, polydipsia, polyphagia and polyuria.  Genitourinary: Positive for urgency. Negative for dysuria, vaginal bleeding, vaginal discharge, difficulty urinating, vaginal pain and pelvic pain.  Musculoskeletal: Negative.   Skin: Positive for wound.  Neurological: Negative for dizziness, light-headedness and numbness.  Psychiatric/Behavioral: Positive for dysphoric mood. Negative for suicidal ideas and sleep disturbance.   Per HPI unless specifically indicated above     Objective:    BP 119/85 mmHg  Pulse  102  Temp(Src) 99.4 F (37.4 C) (Oral)  Resp 16  Ht  (1.575 m)  Wt 148 lb 9.6 oz (67.405 kg)  BMI 27.17 kg/m2  LMP 09/15/2015 (Exact Date)  Wt Readings from Last 3 Encounters:  10/10/15 148 lb 9.6 oz (67.405 kg)  10/03/15 150 lb (68.04 kg)  09/30/15 147 lb 9.6 oz (66.951 kg)    Depression screen PHQ 2/9 10/10/2015  Decreased Interest 2  Down, Depressed, Hopeless 1  PHQ - 2 Score 3  Altered sleeping 3  Tired, decreased energy 3  Change in appetite 2  Feeling bad or failure about yourself   1  Trouble concentrating 2  Moving slowly or fidgety/restless 2  Suicidal thoughts 1  PHQ-9 Score 17  Difficult doing work/chores Somewhat difficult     Physical Exam  Constitutional: She is oriented to person, place, and time. She appears well-developed and well-nourished.  HENT:  Head: Normocephalic and atraumatic.  Neck: Neck supple.  Cardiovascular: Normal rate, regular rhythm and normal heart sounds.  Exam reveals no gallop and no friction rub.   No murmur heard. Pulmonary/Chest: Effort normal and breath sounds normal. She has no wheezes. She exhibits no tenderness.  Abdominal: Soft. Normal appearance. She exhibits no distension and no mass. Bowel sounds are increased. There is no tenderness. There is no rebound and no guarding. A hernia is present.  Umbilical hernia.    Musculoskeletal: Normal range of motion. She exhibits no edema or tenderness.  Lymphadenopathy:       Head (right side): Posterior auricular (1 small tender node- smaller since completing antibiotics. ) adenopathy present.  Neurological: She is alert and oriented to person, place, and time.  Skin: Skin is warm and dry.  Resolving abscess on scalp.   Psychiatric: She has a normal mood and affect. Her behavior is normal. Judgment and thought content normal.   Results for orders placed or performed in visit on 09/30/15  Microscopic Examination  Result Value Ref Range   WBC, UA None seen 0 -  5 /hpf   RBC, UA 0-2 0 -  2 /hpf   Epithelial Cells (non renal) 0-10 0 - 10 /hpf   Bacteria, UA None seen None seen/Few  Urinalysis, Complete  Result Value Ref Range   Specific Gravity, UA 1.010 1.005 - 1.030   pH, UA 5.5 5.0 - 7.5   Color, UA Yellow Yellow   Appearance Ur Clear Clear   Leukocytes, UA Negative Negative   Protein, UA Negative Negative/Trace   Glucose, UA Negative Negative   Ketones, UA Negative Negative   RBC, UA 1+ (A) Negative   Bilirubin, UA Negative Negative   Urobilinogen, Ur 0.2 0.2 - 1.0  mg/dL   Nitrite, UA Negative Negative   Microscopic Examination See below:       Assessment & Plan:   Problem List Items Addressed This Visit      Digestive   Constipation - Primary    Likely 2/2 IBS D.  Trial of probiotic. Continue clean out to help with urinary urgency. Add miralax daily. Consider GI referral with continued symptoms. Discussed FODMAP diet and reducing dairy/gluten to see if symptoms improve.       Relevant Medications   bisacodyl (DULCOLAX) 5 MG EC tablet     Other   Depression    Pt would not like to take medication. Would like to try herbal remedies at this time. Check TSH, vitamin D.  Recommend fish oil OTC to help with symptoms. Daily  exercise and mindfulness/meditation suggested.        Relevant Orders   VITAMIN D 25 Hydroxy (Vit-D Deficiency, Fractures)   B12 and Folate Panel   TSH   Urinary urgency    Managed by urology. Thought to be 2/2 constipation.        Other Visit Diagnoses    Tinnitus, right        Pt declining ENT referral at this time- thinks it is resolving infection. Plan to refer to ENT if continues.     Relevant Orders    Comprehensive metabolic panel    Family history of high cholesterol        baseline lipid panel.      Relevant Orders    Lipid panel       Meds ordered this encounter  Medications  . bisacodyl (DULCOLAX) 5 MG EC tablet    Sig: Take 1 tablet (5 mg total) by mouth at bedtime as needed for moderate constipation. For 1 week.    Dispense:  30 tablet    Refill:  0    Order Specific Question:  Supervising Provider    Answer:  Janeann Forehand [[id number]]      Follow up plan: Return in about 4 weeks (around 11/07/2015) for Constipation- IBS D.

## 2015-10-10 NOTE — Assessment & Plan Note (Signed)
Managed by urology. Thought to be 2/2 constipation.

## 2015-10-11 ENCOUNTER — Telehealth: Payer: Self-pay | Admitting: Radiology

## 2015-10-11 NOTE — Telephone Encounter (Signed)
[facility] stating pt cancelled scheduled evaluation for 10/07/15 for pelvic floor dysfunction & did not want to reschedule.

## 2015-10-12 ENCOUNTER — Encounter: Payer: BLUE CROSS/BLUE SHIELD | Admitting: Physical Therapy

## 2015-10-13 ENCOUNTER — Encounter: Payer: Self-pay | Admitting: Emergency Medicine

## 2015-10-13 ENCOUNTER — Ambulatory Visit: Payer: BLUE CROSS/BLUE SHIELD | Admitting: Family Medicine

## 2015-10-13 ENCOUNTER — Emergency Department
Admission: EM | Admit: 2015-10-13 | Discharge: 2015-10-13 | Disposition: A | Discharge status: Home / Self Care | Payer: BLUE CROSS/BLUE SHIELD | Attending: Emergency Medicine | Admitting: Emergency Medicine

## 2015-10-13 DIAGNOSIS — R55 Syncope and collapse: Secondary | ICD-10-CM | POA: Diagnosis present

## 2015-10-13 DIAGNOSIS — F329 Major depressive disorder, single episode, unspecified: Secondary | ICD-10-CM | POA: Insufficient documentation

## 2015-10-13 DIAGNOSIS — Z87891 Personal history of nicotine dependence: Secondary | ICD-10-CM | POA: Insufficient documentation

## 2015-10-13 LAB — CBC
HEMATOCRIT: 42.5 % (ref 35.0–47.0)
HEMOGLOBIN: 14.4 g/dL (ref 12.0–16.0)
MCH: 30.8 pg (ref 26.0–34.0)
MCHC: 33.8 g/dL (ref 32.0–36.0)
MCV: 90.9 fL (ref 80.0–100.0)
Platelets: 236 10*3/uL (ref 150–440)
RBC: 4.67 MIL/uL (ref 3.80–5.20)
RDW: 12.8 % (ref 11.5–14.5)
WBC: 2.8 10*3/uL — ABNORMAL LOW (ref 3.6–11.0)

## 2015-10-13 LAB — URINALYSIS COMPLETE WITH MICROSCOPIC (ARMC ONLY)
BACTERIA UA: NONE SEEN
Bilirubin Urine: NEGATIVE
GLUCOSE, UA: NEGATIVE mg/dL
Ketones, ur: NEGATIVE mg/dL
Leukocytes, UA: NEGATIVE
Nitrite: NEGATIVE
PROTEIN: NEGATIVE mg/dL
SPECIFIC GRAVITY, URINE: 1.004 — AB (ref 1.005–1.030)
SQUAMOUS EPITHELIAL / LPF: NONE SEEN
pH: 6 (ref 5.0–8.0)

## 2015-10-13 LAB — BASIC METABOLIC PANEL
ANION GAP: 8 (ref 5–15)
BUN: 9 mg/dL (ref 6–20)
CO2: 27 mmol/L (ref 22–32)
Calcium: 8.9 mg/dL (ref 8.9–10.3)
Chloride: 101 mmol/L (ref 101–111)
Creatinine, Ser: 1.09 mg/dL — ABNORMAL HIGH (ref 0.44–1.00)
GFR calc Af Amer: >60 mL/min (ref >60)
GFR calc non Af Amer: >60 mL/min (ref >60)
GLUCOSE: 138 mg/dL — AB (ref 65–99)
POTASSIUM: 3.4 mmol/L — AB (ref 3.5–5.1)
Sodium: 136 mmol/L (ref 135–145)

## 2015-10-13 LAB — PREGNANCY, URINE: PREG TEST UR: NEGATIVE

## 2015-10-13 MED ORDER — ONDANSETRON HCL 4 MG/2ML IJ SOLN
INTRAMUSCULAR | Status: AC
  Filled 2015-10-13: qty 2

## 2015-10-13 MED ORDER — ONDANSETRON HCL 4 MG/2ML IJ SOLN
4.0000 mg | Freq: Once | INTRAMUSCULAR | Status: AC
  Administered 2015-10-13: 4 mg via INTRAVENOUS

## 2015-10-13 MED ORDER — SODIUM CHLORIDE 0.9 % IV BOLUS (SEPSIS)
1000.0000 mL | Freq: Once | INTRAVENOUS | Status: AC
Start: 2015-10-13 — End: 2015-10-13
  Administered 2015-10-13: 1000 mL via INTRAVENOUS

## 2015-10-13 NOTE — ED Notes (Signed)
MD at bedside. 

## 2015-10-13 NOTE — Discharge Instructions (Signed)
Stay hydrated.   See your doctor, consider holter monitor if you pass out again   Return to ER if you have another syncope, chest pain, vomiting

## 2015-10-13 NOTE — ED Notes (Addendum)
Pt presents to ED with complaints of passing out periodically for approximately two weeks. Pt reports she gets hot and sweaty and dizzy before she passes out. Pt states she recently began taking oxybutin. Pt states she had an enlarged lymph node behind her ear. Pt reports she has been diagnosed with IBS. Pt reports multiple concerns but is vague with dates of complaints.

## 2015-10-13 NOTE — ED Provider Notes (Signed)
CSN: [id number]     Arrival date & time 10/13/15  1714 History   First MD Initiated Contact with Patient 10/13/15 1814     Chief Complaint  Patient presents with  . Loss of Consciousness     (Consider location/radiation/quality/duration/timing/severity/associated sxs/prior Treatment) The history is provided by the patient.  Cindy Bonilla is a 35 y.o. female history of IBS, here presenting with syncope. Patient states that she had an episode of syncope about 2 weeks ago while at work. Last night she Was walking to the bathroom and felt dizzy and hot and passed out.  Denies chest pain. Patient denies urinary symptoms. Recently placed on oxybutin but didn't tolerate it. She had a boil in her scalp and some right neck lymph node swelling that improved. Denies fevers, no vomiting. No hx of CAD or stents   Past Medical History  Diagnosis Date  . Arthritis   . Urinary urgency   . Depression    Past Surgical History  Procedure Laterality Date  . None     Family History  Problem Relation Age of Onset  . Bladder Cancer Neg Hx   . Prostate cancer Neg Hx   . Kidney cancer Neg Hx   . Diabetes Mother   . Depression Mother   . Aneurysm Brother    Social History  Substance Use Topics  . Smoking status: Former Games developermoker  . Smokeless tobacco: Never Used  . Alcohol Use: 0.0 oz/week    0 Standard drinks or equivalent per week     Comment: occasional   OB History    No data available     Review of Systems  Cardiovascular: Positive for syncope.  Neurological: Positive for syncope.  All other systems reviewed and are negative.     Allergies  Motrin and Latex  Home Medications   Prior to Admission medications   Medication Sig Start Date End Date Taking? Authorizing Provider  bisacodyl (DULCOLAX) 5 MG EC tablet Take 1 tablet (5 mg total) by mouth at bedtime as needed for moderate constipation. For 1 week. 10/10/15   Amy Rusty AusLauren Krebs, NP  Norgestrel-Ethinyl Estradiol (CRYSELLE-28 PO)  Take by mouth.    Historical Provider, MD   BP 114/80 mmHg  Pulse 81  Temp(Src) 98.1 F (36.7 C) (Oral)  Resp 16  Ht 5\' 2"  (1.575 m)  Wt 147 lb (66.679 kg)  BMI 26.88 kg/m2  SpO2 100%  LMP 10/13/2015 Physical Exam  Constitutional: She is oriented to person, place, and time. She appears well-developed and well-nourished.  HENT:  Head: Normocephalic.  Mouth/Throat: Oropharynx is clear and moist.  TM bilaterally nl. No obvious cervical LAD   Eyes: Conjunctivae are normal. Pupils are equal, round, and reactive to light.  Neck: Normal range of motion. Neck supple.  Cardiovascular: Normal rate, regular rhythm and normal heart sounds.   Pulmonary/Chest: Effort normal and breath sounds normal. No respiratory distress. She has no wheezes. She has no rales.  Abdominal: Soft. Bowel sounds are normal. She exhibits no distension. There is no tenderness. There is no rebound.  Musculoskeletal: Normal range of motion. She exhibits no edema or tenderness.  Neurological: She is alert and oriented to person, place, and time. No cranial nerve deficit. Coordination normal.  Skin: Skin is warm and dry.  Psychiatric: She has a normal mood and affect. Her behavior is normal. Judgment and thought content normal.  Nursing note and vitals reviewed.   ED Course  Procedures (including critical care time) Labs Review Labs Reviewed  BASIC METABOLIC PANEL - Abnormal; Notable for the following:    Potassium 3.4 (*)    Glucose, Bld 138 (*)    Creatinine, Ser 1.09 (*)    All other components within normal limits  CBC - Abnormal; Notable for the following:    WBC 2.8 (*)    All other components within normal limits  URINALYSIS COMPLETEWITH MICROSCOPIC ([facility] ONLY) - Abnormal; Notable for the following:    Color, Urine STRAW (*)    APPearance CLEAR (*)    Specific Gravity, Urine 1.004 (*)    Hgb urine dipstick 1+ (*)    All other components within normal limits  PREGNANCY, URINE  CBG MONITORING, ED     Imaging Review No results found. I have personally reviewed and evaluated these images and lab results as part of my medical decision-making.   EKG Interpretation None       ED ECG REPORT I, Phares Zaccone, the attending physician, personally viewed and interpreted this ECG.   Date: 10/13/2015  EKG Time: 17:48  Rate: 92  Rhythm: normal EKG, normal sinus rhythm  Axis: normal  Intervals:none  ST&T Change: none   MDM   Final diagnoses:  None    Cindy Bonilla is a 35 y.o. female here with syncope. Well appearing, no chest pain, low risk base on [place] syncope rule. Will check labs, pregnancy, orthostatics. Will hydrate and reassess.   8:41 PM Labs unremarkable. Given 1 L NS bolus and not orthostatic. Felt better. Consider holter outpatient if she has another episode of syncope.    Richardean Canal, MD 10/13/15 2043

## 2015-10-14 ENCOUNTER — Encounter: Payer: Self-pay | Admitting: Family Medicine

## 2015-10-18 ENCOUNTER — Encounter: Payer: Self-pay | Admitting: Cardiology

## 2015-10-18 ENCOUNTER — Encounter: Payer: Self-pay | Admitting: Family Medicine

## 2015-10-18 ENCOUNTER — Ambulatory Visit (INDEPENDENT_AMBULATORY_CARE_PROVIDER_SITE_OTHER): Payer: BLUE CROSS/BLUE SHIELD | Admitting: Cardiology

## 2015-10-18 ENCOUNTER — Encounter: Payer: Self-pay | Admitting: *Deleted

## 2015-10-18 ENCOUNTER — Ambulatory Visit (INDEPENDENT_AMBULATORY_CARE_PROVIDER_SITE_OTHER): Payer: BLUE CROSS/BLUE SHIELD | Admitting: Family Medicine

## 2015-10-18 VITALS — BP 118/86 | HR 82 | Temp 98.9°F | Resp 16 | Ht 62.0 in | Wt 140.8 lb

## 2015-10-18 VITALS — BP 110/78 | HR 68 | Ht 62.0 in | Wt 141.0 lb

## 2015-10-18 DIAGNOSIS — R55 Syncope and collapse: Secondary | ICD-10-CM | POA: Diagnosis not present

## 2015-10-18 DIAGNOSIS — R42 Dizziness and giddiness: Secondary | ICD-10-CM | POA: Diagnosis not present

## 2015-10-18 DIAGNOSIS — I951 Orthostatic hypotension: Secondary | ICD-10-CM

## 2015-10-18 NOTE — Progress Notes (Signed)
Cardiology Office Note   Date:  10/18/2015   ID:  Cindy Bonilla, DOB 1980/07/23, MRN [id number]  Referring Doctor:  Filbert BertholdAmy L Krebs, NP   Cardiologist:   Almond LintAileen Geralene Afshar, MD   Reason for consultation:  Chief Complaint  Patient presents with  . New Patient (Initial Visit)    heart monitor   Evaluation of passing out   History of Present Illness: Cindy Bonilla is a 35 y.o. female who presents for episodes of dizziness and passing out. Symptoms started approximately 3 weeks ago. All episodes occur with standing. None with sitting down or lying down. She works the third shift at a [employer]. She is required to be on her feet the whole time. She has 2 20 minute breaks throughout the shift. First episode of dizziness and passing out was approximately 3 weeks ago when she started feeling dizzy -- vision started becoming blurry and she noted that she was going to faint. She called and a coworker to help her out. The coworker helped her sit down on a ladder and send her. Eventually symptoms got better. Another episode occurred in the middle of the night when patient needed to go to the bathroom right away to pee. She had been recently diagnosed with urinary incontinence. She sat up by her bedtry to get to the bathroom to [place]. She didn't feel quite rightand so she waited a little bit longer by her bed. But because she needed to go right away, she decided to walk to the bathroom. On her way, she started feeling lightheaded and dizzy and vision turning dark. She fell on her knees and went down to the floor . she was out for a few seconds only. Again episodes are mainly restricted to upright position, moderate to severe intensity, lasting a few seconds only at the time.  Patient denies chest pain, headache, fever, cough, colds, abdominal pain. She does have urinary incontinence. No PND orthopnea or edema.   ROS:  Please see the history of present illness. Aside from mentioned under HPI, all other  systems are reviewed and negative.    Past Medical History  Diagnosis Date  . Arthritis   . Urinary urgency   . Depression     Past Surgical History  Procedure Laterality Date  . None       reports that she has quit smoking. She has never used smokeless tobacco. She reports that she drinks alcohol. She reports that she does not use illicit drugs.   family history includes Aneurysm in her brother; Depression in her mother; Diabetes in her mother. There is no history of Bladder Cancer, Prostate cancer, or Kidney cancer.   Current Outpatient Prescriptions  Medication Sig Dispense Refill  . bisacodyl (DULCOLAX) 5 MG EC tablet Take 1 tablet (5 mg total) by mouth at bedtime as needed for moderate constipation. For 1 week. 30 tablet 0  . Norgestrel-Ethinyl Estradiol (CRYSELLE-28 PO) Take by mouth.     No current facility-administered medications for this visit.    Allergies: Motrin and Latex    PHYSICAL EXAM: VS:  BP 110/78 mmHg  Pulse 68  Ht 5\' 2"  (1.575 m)  Wt 141 lb (63.957 kg)  BMI 25.78 kg/m2  LMP 10/13/2015 , Body mass index is 25.78 kg/(m^2). Wt Readings from Last 3 Encounters:  10/18/15 141 lb (63.957 kg)  10/18/15 140 lb 12.8 oz (63.866 kg)  10/13/15 147 lb (66.679 kg)    GENERAL:  well developed, well nourished, not in acute  distress HEENT: normocephalic, pink conjunctivae, anicteric sclerae, no xanthelasma, normal dentition, oropharynx clear NECK:  no neck vein engorgement, JVP normal, no hepatojugular reflux, carotid upstroke brisk and symmetric, no bruit, no thyromegaly, no lymphadenopathy LUNGS:  good respiratory effort, clear to auscultation bilaterally CV:  PMI not displaced, no thrills, no lifts, S1 and S2 within normal limits, no palpable S3 or S4, no murmurs, no rubs, no gallops ABD:  Soft, nontender, nondistended, normoactive bowel sounds, no abdominal aortic bruit, no hepatomegaly, no splenomegaly MS: nontender back, no kyphosis, no scoliosis, no joint  deformities EXT:  2+ DP/PT pulses, no edema, no varicosities, no cyanosis, no clubbing SKIN: warm, nondiaphoretic, normal turgor, no ulcers NEUROPSYCH: alert, oriented to person, place, and time, sensory/motor grossly intact, normal mood, appropriate affect  Recent Labs: 10/13/2015: BUN 9; Creatinine, Ser 1.09*; Hemoglobin 14.4; Platelets 236; Potassium 3.4*; Sodium 136   Lipid Panel No results found for: CHOL, TRIG, HDL, CHOLHDL, VLDL, LDLCALC, LDLDIRECT   Other studies Reviewed:  EKG:  The ekg from 10/14/2015 was personally reviewed by me and it revealed sinus rhythm  Additional studies/ records that were reviewed personally reviewed by me today include: none available   ASSESSMENT AND PLAN:  Episodes of loss of consciousness From her history, episodes are most likely related to orthostatic changes with upright position/orthostatic in nature. With orthostatic vital signs, there is a note of significant heart rate increase with change in position. Patient could not really complete the orthostatic vital signs due to dizziness with sitting up and standing up. Echocardiogram and Holter monitor will be done for ruling out structural heart disease and arrhythmia. Patient advised to remain well hydrated, adequate food intake, and use of compression stockings. Patient will begin a note regarding our recommendation of avoiding prolonged standing. Patient follow-up in the office after tests have been done.  Current medicines are reviewed at length with the patient today.  The patient does not have concerns regarding medicines.  Labs/ tests ordered today include:  Orders Placed This Encounter  Procedures  . Holter monitor - 24 hour  . ECHOCARDIOGRAM COMPLETE    I had a lengthy and detailed discussion with the patient regarding diagnoses, prognosis, diagnostic options, treatment options .   I counseled the patient on importance of lifestyle modification including heart healthy diet,  regular physical activity .   Disposition:   FU with undersigned after tests    Signed, Almond Lint, MD  10/18/2015 4:32 PM    [facility]

## 2015-10-18 NOTE — Patient Instructions (Signed)
We will get some labs to further evaluate your symptoms. Drink plenty of water.  Change positions slowly. Do not drive if you feel faint. Sit down and drink water if you feel you are going to pass out.

## 2015-10-18 NOTE — Patient Instructions (Addendum)
Medication Instructions:  Your physician recommends that you continue on your current medications as directed. Please refer to the Current Medication list given to you today.   Labwork: None ordered  Testing/Procedures: Your physician has requested that you have an echocardiogram. Echocardiography is a painless test that uses sound waves to create images of your heart. It provides your doctor with information about the size and shape of your heart and how well your heart's chambers and valves are working. This procedure takes approximately one hour. There are no restrictions for this procedure.  Date & Time: ___________________________________________________________________  Your physician has recommended that you wear a holter monitor. Holter monitors are medical devices that record the heart's electrical activity. Doctors most often use these monitors to diagnose arrhythmias. Arrhythmias are problems with the speed or rhythm of the heartbeat. The monitor is a small, portable device. You can wear one while you do your normal daily activities. This is usually used to diagnose what is causing palpitations/syncope (passing out).  Date & Time:___________________________________________________________________  Follow-Up: Your physician recommends that you schedule a follow-up appointment after testing to review results with Dr. Alvino ChapelIngal.  Date & Time: ____________________________________________________________________   Any Other Special Instructions Will Be Listed Below (If Applicable).     If you need a refill on your cardiac medications before your next appointment, please call your pharmacy.  Echocardiogram An echocardiogram, or echocardiography, uses sound waves (ultrasound) to produce an image of your heart. The echocardiogram is simple, painless, obtained within a short period of time, and offers valuable information to your health care provider. The images from an echocardiogram can  provide information such as:  Evidence of coronary artery disease (CAD).  Heart size.  Heart muscle function.  Heart valve function.  Aneurysm detection.  Evidence of a past heart attack.  Fluid buildup around the heart.  Heart muscle thickening.  Assess heart valve function. LET [facility] PROVIDER KNOW ABOUT:  Any allergies you have.  All medicines you are taking, including vitamins, herbs, eye drops, creams, and over-the-counter medicines.  Previous problems you or members of your family have had with the use of anesthetics.  Any blood disorders you have.  Previous surgeries you have had.  Medical conditions you have.  Possibility of pregnancy, if this applies. BEFORE THE PROCEDURE  No special preparation is needed. Eat and drink normally.  PROCEDURE   In order to produce an image of your heart, gel will be applied to your chest and a wand-like tool (transducer) will be moved over your chest. The gel will help transmit the sound waves from the transducer. The sound waves will harmlessly bounce off your heart to allow the heart images to be captured in real-time motion. These images will then be recorded.  You may need an IV to receive a medicine that improves the quality of the pictures. AFTER THE PROCEDURE You may return to your normal schedule including diet, activities, and medicines, unless your health care provider tells you otherwise.   This information is not intended to replace advice given to you by your health care provider. Make sure you discuss any questions you have with your health care provider.   Document Released: 06/08/2000 Document Revised: 07/02/2014 Document Reviewed: 02/16/2013 Elsevier Interactive Patient Education 2016 Else[employer] Inc.  Holter Monitoring A Holter monitor is a small device that is used to detect abnormal heart rhythms. It clips to your clothing and is connected by wires to flat, sticky disks (electrodes) that attach to  your chest.  It is worn continuously for 24-48 hours. HOME CARE INSTRUCTIONS  Wear your Holter monitor at all times, even while exercising and sleeping, for as long as directed by your health care provider.  Make sure that the Holter monitor is safely clipped to your clothing or close to your body as recommended by your health care provider.  Do not get the monitor or wires wet.  Do not put body lotion or moisturizer on your chest.  Keep your skin clean.  Keep a diary of your daily activities, such as walking and doing chores. If you feel that your heartbeat is abnormal or that your heart is fluttering or skipping a beat:  Record what you are doing when it happens.  Record what time of day the symptoms occur.  Return your Holter monitor as directed by your health care provider.  Keep all follow-up visits as directed by your health care provider. This is important. SEEK IMMEDIATE MEDICAL CARE IF:  You feel lightheaded or you faint.  You have trouble breathing.  You feel pain in your chest, upper arm, or jaw.  You feel sick to your stomach and your skin is pale, cool, or damp.  You heartbeat feels unusual or abnormal.   This information is not intended to replace advice given to you by your health care provider. Make sure you discuss any questions you have with your health care provider.   Document Released: 03/09/2004 Document Revised: 07/02/2014 Document Reviewed: 01/18/2014 Elsevier Interactive Patient Education [employer]

## 2015-10-18 NOTE — Progress Notes (Signed)
Subjective:    Patient ID: Cindy Bonilla, female    DOB: 1980-08-04, 35 y.o.   MRN: [id number]  HPI: Cindy Bonilla is a 35 y.o. female presenting on 10/18/2015 for Dizziness   HPI  Pt presents for ER follow-up of syncopal episode. This is the 3rd syncopal episode she has had in 1 month. Previous episodes were thought to be related to a medication. Pt reports she got up to go to bathroom after sleeping- was voiding and then woke up on floor. Fiancee reports eyes rolled in the back of her head and she got very sweaty. Previous syncope episodes- she has felt hot and seen stars. Was sweaty and nauseated prior to passing. Was walking prior to incident at work.  Pt reports she feels presyncopa/ faint- about 5 times per week. All symptoms began about 1 month ago. Eating regular meals. No pattern to symptoms. She reports she is usually standing when symptoms occur.  Has had symptoms since last Thursday when seen in the ER.   Past Medical History  Diagnosis Date  . Arthritis   . Urinary urgency   . Depression     Current Outpatient Prescriptions on File Prior to Visit  Medication Sig  . bisacodyl (DULCOLAX) 5 MG EC tablet Take 1 tablet (5 mg total) by mouth at bedtime as needed for moderate constipation. For 1 week.  . Norgestrel-Ethinyl Estradiol (CRYSELLE-28 PO) Take by mouth.   No current facility-administered medications on file prior to visit.    Review of Systems  Constitutional: Negative for fever and chills.  HENT: Positive for congestion.   Respiratory: Positive for cough (allergies and congestion). Negative for chest tightness and wheezing.   Cardiovascular: Negative for chest pain, palpitations and leg swelling.  Gastrointestinal: Positive for nausea and constipation.  Endocrine: Positive for heat intolerance.  Genitourinary: Negative for dysuria, urgency, difficulty urinating and vaginal pain.  Musculoskeletal: Negative for arthralgias, neck pain and neck stiffness.  Skin:  Negative for rash and wound.  Neurological: Positive for dizziness and syncope.  Psychiatric/Behavioral: Negative for suicidal ideas.   Per HPI unless specifically indicated above     Objective:    BP 118/86 mmHg  Pulse 82  Temp(Src) 98.9 F (37.2 C) (Oral)  Resp 16  Ht  (1.575 m)  Wt 140 lb 12.8 oz (63.866 kg)  BMI 25.75 kg/m2  SpO2 99%  LMP 10/13/2015  Wt Readings from Last 3 Encounters:  10/18/15 140 lb 12.8 oz (63.866 kg)  10/13/15 147 lb (66.679 kg)  10/10/15 148 lb 9.6 oz (67.405 kg)    Physical Exam  Constitutional: She is oriented to person, place, and time. She appears well-developed and well-nourished.  HENT:  Head: Normocephalic and atraumatic.  Neck: Neck supple.  Cardiovascular: Normal rate, regular rhythm and normal heart sounds.  Exam reveals no gallop and no friction rub.   No murmur heard. Pulmonary/Chest: Effort normal and breath sounds normal. No respiratory distress. She has no wheezes. She has no rales. She exhibits no tenderness.  Abdominal: Soft. Normal appearance and bowel sounds are normal. She exhibits no distension and no mass. There is no tenderness. There is no rebound and no guarding.  Musculoskeletal: Normal range of motion. She exhibits no edema or tenderness.  Lymphadenopathy:    She has no cervical adenopathy.  Neurological: She is alert and oriented to person, place, and time. She has normal strength and normal reflexes. No cranial nerve deficit or sensory deficit. She displays a negative Romberg sign.  CN  intact. Heel to shin intact. Finger to nose intact.   Skin: Skin is warm and dry.   Results for orders placed or performed during the hospital encounter of 10/13/15  Basic metabolic panel  Result Value Ref Range   Sodium 136 135 - 145 mmol/L   Potassium 3.4 (L) 3.5 - 5.1 mmol/L   Chloride 101 101 - 111 mmol/L   CO2 27 22 - 32 mmol/L   Glucose, Bld 138 (H) 65 - 99 mg/dL   BUN 9 6 - 20 mg/dL   Creatinine, Ser 9.561.09 (H) 0.44 -  1.00 mg/dL   Calcium 8.9 8.9 - 21.310.3 mg/dL   GFR calc non Af Amer >60 >60 mL/min   GFR calc Af Amer >60 >60 mL/min   Anion gap 8 5 - 15  CBC  Result Value Ref Range   WBC 2.8 (L) 3.6 - 11.0 K/uL   RBC 4.67 3.80 - 5.20 MIL/uL   Hemoglobin 14.4 12.0 - 16.0 g/dL   HCT 08.642.5 57.835.0 - 46.947.0 %   MCV 90.9 80.0 - 100.0 fL   MCH 30.8 26.0 - 34.0 pg   MCHC 33.8 32.0 - 36.0 g/dL   RDW 62.912.8 52.811.5 - 41.314.5 %   Platelets 236 150 - 440 K/uL  Urinalysis complete, with microscopic ([facility] only)  Result Value Ref Range   Color, Urine STRAW (A) YELLOW   APPearance CLEAR (A) CLEAR   Glucose, UA NEGATIVE NEGATIVE mg/dL   Bilirubin Urine NEGATIVE NEGATIVE   Ketones, ur NEGATIVE NEGATIVE mg/dL   Specific Gravity, Urine 1.004 (L) 1.005 - 1.030   Hgb urine dipstick 1+ (A) NEGATIVE   pH 6.0 5.0 - 8.0   Protein, ur NEGATIVE NEGATIVE mg/dL   Nitrite NEGATIVE NEGATIVE   Leukocytes, UA NEGATIVE NEGATIVE   RBC / HPF 0-5 0 - 5 RBC/hpf   WBC, UA 0-5 0 - 5 WBC/hpf   Bacteria, UA NONE SEEN NONE SEEN   Squamous Epithelial / LPF NONE SEEN NONE SEEN   Mucous PRESENT   Pregnancy, urine  Result Value Ref Range   Preg Test, Ur NEGATIVE NEGATIVE      Assessment & Plan:   Problem List Items Addressed This Visit    None    Visit Diagnoses    Dizzy    -  Primary    Prior to syncopal episodes. Check TSH, CBC, CMET.     Relevant Orders    EKG 12-Lead    Syncope and collapse        Syncopal and presyncopal episodes. ECGs WNL.  ER recommended holter monitor-agree. Have placed referral to cardiology today. Check CMET, TSH, CBC. Return in 2 weeks for follow-up. To ER with further episodes.     Relevant Orders    Ambulatory referral to Cardiology       No orders of the defined types were placed in this encounter.      Follow up plan: Return in about 2 weeks (around 11/01/2015) for syncope follow-up. .Marland Kitchen

## 2015-10-19 ENCOUNTER — Encounter: Payer: BLUE CROSS/BLUE SHIELD | Admitting: Physical Therapy

## 2015-10-19 LAB — LIPID PANEL
CHOL/HDL RATIO: 3.3 ratio (ref 0.0–4.4)
Cholesterol, Total: 156 mg/dL (ref 100–199)
HDL: 48 mg/dL (ref >39)
LDL CALC: 89 mg/dL (ref 0–99)
Triglycerides: 97 mg/dL (ref 0–149)
VLDL Cholesterol Cal: 19 mg/dL (ref 5–40)

## 2015-10-19 LAB — COMPREHENSIVE METABOLIC PANEL
A/G RATIO: 1.5 (ref 1.2–2.2)
ALT: 36 IU/L — ABNORMAL HIGH (ref 0–32)
AST: 26 IU/L (ref 0–40)
Albumin: 3.9 g/dL (ref 3.5–5.5)
Alkaline Phosphatase: 51 IU/L (ref 39–117)
BUN/Creatinine Ratio: 7 — ABNORMAL LOW (ref 9–23)
BUN: 6 mg/dL (ref 6–20)
Bilirubin Total: <0.2 mg/dL (ref 0.0–1.2)
CALCIUM: 8.7 mg/dL (ref 8.7–10.2)
CO2: 23 mmol/L (ref 18–29)
Chloride: 102 mmol/L (ref 96–106)
Creatinine, Ser: 0.87 mg/dL (ref 0.57–1.00)
GFR, EST AFRICAN AMERICAN: 99 mL/min/{1.73_m2} (ref >59)
GFR, EST NON AFRICAN AMERICAN: 86 mL/min/{1.73_m2} (ref >59)
GLOBULIN, TOTAL: 2.6 g/dL (ref 1.5–4.5)
Glucose: 94 mg/dL (ref 65–99)
POTASSIUM: 4.2 mmol/L (ref 3.5–5.2)
SODIUM: 142 mmol/L (ref 134–144)
TOTAL PROTEIN: 6.5 g/dL (ref 6.0–8.5)

## 2015-10-19 LAB — TSH: TSH: 0.312 u[IU]/mL — ABNORMAL LOW (ref 0.450–4.500)

## 2015-10-19 LAB — B12 AND FOLATE PANEL
FOLATE: 18.9 ng/mL (ref >3.0)
Vitamin B-12: 821 pg/mL (ref 211–946)

## 2015-10-19 LAB — VITAMIN D 25 HYDROXY (VIT D DEFICIENCY, FRACTURES): Vit D, 25-Hydroxy: 43.5 ng/mL (ref 30.0–100.0)

## 2015-10-20 LAB — SPECIMEN STATUS REPORT

## 2015-10-20 LAB — T3, FREE: T3 FREE: 2.7 pg/mL (ref 2.0–4.4)

## 2015-10-20 LAB — T4, FREE: FREE T4: 1.01 ng/dL (ref 0.82–1.77)

## 2015-10-25 ENCOUNTER — Ambulatory Visit (INDEPENDENT_AMBULATORY_CARE_PROVIDER_SITE_OTHER): Payer: BLUE CROSS/BLUE SHIELD

## 2015-10-25 DIAGNOSIS — I951 Orthostatic hypotension: Secondary | ICD-10-CM

## 2015-10-25 DIAGNOSIS — R55 Syncope and collapse: Secondary | ICD-10-CM

## 2015-10-31 ENCOUNTER — Encounter: Payer: Self-pay | Admitting: Family Medicine

## 2015-11-01 ENCOUNTER — Ambulatory Visit (INDEPENDENT_AMBULATORY_CARE_PROVIDER_SITE_OTHER): Payer: BLUE CROSS/BLUE SHIELD

## 2015-11-01 ENCOUNTER — Other Ambulatory Visit: Payer: Self-pay

## 2015-11-01 DIAGNOSIS — I951 Orthostatic hypotension: Secondary | ICD-10-CM | POA: Diagnosis not present

## 2015-11-02 ENCOUNTER — Ambulatory Visit
Admission: RE | Admit: 2015-11-02 | Discharge: 2015-11-02 | Disposition: A | Discharge status: Home / Self Care | Payer: BLUE CROSS/BLUE SHIELD | Source: Ambulatory Visit | Attending: Cardiology | Admitting: Cardiology

## 2015-11-02 DIAGNOSIS — I951 Orthostatic hypotension: Secondary | ICD-10-CM | POA: Diagnosis present

## 2015-11-02 DIAGNOSIS — R55 Syncope and collapse: Secondary | ICD-10-CM | POA: Insufficient documentation

## 2015-11-03 ENCOUNTER — Ambulatory Visit: Payer: BLUE CROSS/BLUE SHIELD | Admitting: Cardiology

## 2015-11-14 ENCOUNTER — Ambulatory Visit (INDEPENDENT_AMBULATORY_CARE_PROVIDER_SITE_OTHER): Payer: BLUE CROSS/BLUE SHIELD | Admitting: Family Medicine

## 2015-11-14 VITALS — BP 113/84 | HR 73 | Temp 98.6°F | Resp 16 | Ht 62.0 in | Wt 149.0 lb

## 2015-11-14 DIAGNOSIS — R079 Chest pain, unspecified: Secondary | ICD-10-CM

## 2015-11-14 DIAGNOSIS — R55 Syncope and collapse: Secondary | ICD-10-CM | POA: Insufficient documentation

## 2015-11-14 DIAGNOSIS — K5901 Slow transit constipation: Secondary | ICD-10-CM | POA: Diagnosis not present

## 2015-11-14 DIAGNOSIS — R109 Unspecified abdominal pain: Secondary | ICD-10-CM

## 2015-11-14 DIAGNOSIS — F419 Anxiety disorder, unspecified: Secondary | ICD-10-CM

## 2015-11-14 LAB — POCT URINALYSIS DIPSTICK
BILIRUBIN UA: NEGATIVE
GLUCOSE UA: NEGATIVE
KETONES UA: NEGATIVE
Leukocytes, UA: NEGATIVE
Nitrite, UA: NEGATIVE
Protein, UA: NEGATIVE
RBC UA: NEGATIVE
SPEC GRAV UA: 1.01
Urobilinogen, UA: NEGATIVE
pH, UA: 5

## 2015-11-14 MED ORDER — BUSPIRONE HCL 7.5 MG PO TABS: 7.5000 mg | ORAL_TABLET | Freq: Two times a day (BID) | ORAL | Status: DC

## 2015-11-14 NOTE — Assessment & Plan Note (Signed)
Holter monitor results pending with cardiology. No further episodes but patient is having CP now. Seen in ER- thought to be anxiety. Pt to follow with cardiology on Thursday.

## 2015-11-14 NOTE — Patient Instructions (Addendum)
Start Buspar twice daily to help with anxiety. Take 7.5mg  in the AM and PM to help anxiety.   Please follow-up with Dr. Alvino ChapelIngal on Thursday to find out results of Holter Monitor.

## 2015-11-14 NOTE — Progress Notes (Signed)
Subjective:    Patient ID: Cindy Bonilla, female    DOB: [birth year], 35 y.o.   MRN: [id number]  HPI: Cindy QuestMicaela Soeder is a 35 y.o. female presenting on 11/14/2015 for Dizziness   HPI  Pt presents for follow-up of syncopal episode. She was seen in ER and then sent to cardiology. Results of holter monitor are pending. She will go back to see Dr. Alvino ChapelIngal 5/25 for follow-up. However [facility] aroun [facility]. [facility] Gave her fluids and something for nausea. They told her to follow-up with primary care about anxiety. Chest pain symptoms are still present- "when she thinks hard" her chest hurts. She overprocesses everything. She feels very anxious. She did have chest pains while wearing holter monitor. She will follow-up with Dr. Alvino ChapelIngal this week.   She is amenable to starting a medication today but does not want anything related to zoloft. Still having constipation. Controlled with miralax.  Reporting flank pain on R side for several months.   Past Medical History  Diagnosis Date  . Arthritis   . Urinary urgency   . Depression     Current Outpatient Prescriptions on File Prior to Visit  Medication Sig  . bisacodyl (DULCOLAX) 5 MG EC tablet Take 1 tablet (5 mg total) by mouth at bedtime as needed for moderate constipation. For 1 week.  . Norgestrel-Ethinyl Estradiol (CRYSELLE-28 PO) Take by mouth.   No current facility-administered medications on file prior to visit.    Review of Systems  Constitutional: Negative for fever and chills.  HENT: Negative.   Respiratory: Negative for cough, chest tightness and wheezing.   Cardiovascular: Positive for chest pain. Negative for leg swelling.  Gastrointestinal: Positive for constipation. Negative for nausea, vomiting, abdominal pain and diarrhea.  Endocrine: Negative.  Negative for cold intolerance, heat intolerance, polydipsia, polyphagia and polyuria.  Genitourinary: Negative for dysuria and  difficulty urinating.  Musculoskeletal: Negative.   Neurological: Negative for dizziness, syncope, light-headedness and numbness.  Psychiatric/Behavioral: Negative for suicidal ideas. The patient is nervous/anxious.    Per HPI unless specifically indicated above     Objective:    BP 113/84 mmHg  Pulse 73  Temp(Src) 98.6 F (37 C) (Oral)  Resp 16  Ht 5\' 2"  (1.575 m)  Wt 149 lb (67.586 kg)  BMI 27.25 kg/m2  LMP 11/10/2015  Wt Readings from Last 3 Encounters:  11/14/15 149 lb (67.586 kg)  10/18/15 141 lb (63.957 kg)  10/18/15 140 lb 12.8 oz (63.866 kg)    Physical Exam  Constitutional: She is oriented to person, place, and time. She appears well-developed and well-nourished.  HENT:  Head: Normocephalic and atraumatic.  Neck: Neck supple.  Cardiovascular: Normal rate, regular rhythm and normal heart sounds.  Exam reveals no gallop and no friction rub.   No murmur heard. Pulmonary/Chest: Effort normal and breath sounds normal. She has no wheezes. She exhibits no tenderness.  Abdominal: Soft. Normal appearance and bowel sounds are normal. She exhibits no distension and no mass. There is no hepatosplenomegaly. There is tenderness (mildly tender to deep palpation) in the left lower quadrant. There is no rebound, no guarding and no CVA tenderness.  Musculoskeletal: Normal range of motion. She exhibits no edema or tenderness.  Lymphadenopathy:    She has no cervical adenopathy.  Neurological: She is alert and oriented to person, place, and time.  Skin: Skin is warm and dry.  Psychiatric: Her behavior is normal. Judgment and thought  content normal. Her mood appears anxious. She expresses no suicidal ideation. She expresses no suicidal plans.   Results for orders placed or performed in visit on 11/14/15  POCT urinalysis dipstick  Result Value Ref Range   Color, UA yellow    Clarity, UA clear    Glucose, UA negative    Bilirubin, UA negative    Ketones, UA negative    Spec Grav, UA  1.010    Blood, UA negative    pH, UA 5.0    Protein, UA negative    Urobilinogen, UA negative    Nitrite, UA negative    Leukocytes, UA Negative Negative      Assessment & Plan:   Problem List Items Addressed This Visit      Cardiovascular and Mediastinum   Syncopal episodes    Holter monitor results pending with cardiology. No further episodes but patient is having CP now. Seen in ER- thought to be anxiety. Pt to follow with cardiology on Thursday.         Digestive   Constipation    Treated with miralax at home. Pt is not amenable to linzess or amitiza to help with daily constipation. Encouraged fluids and exercise to help with symptoms.         Other   Anxiety - Primary    Start Buspar per patient preference. Take 7.5mg  BID. Encouraged deep breathing and mindfulness. Requesting [facility] records for their recommendations. RTC 1 mos.       Relevant Medications   busPIRone (BUSPAR) 7.5 MG tablet    Other Visit Diagnoses    Flank pain        Likely MSK- UA negative.     Relevant Orders    POCT urinalysis dipstick (Completed)    Chest pain, unspecified chest pain type        Worked up at [facility] on 5/8- thought to be anxiety. Pt to ER if it happens again. Follow with cardiology on Thursday.        Meds ordered this encounter  Medications  . busPIRone (BUSPAR) 7.5 MG tablet    Sig: Take 1 tablet (7.5 mg total) by mouth 2 (two) times daily.    Dispense:  60 tablet    Refill:  11    Order Specific Question:  Supervising Provider    Answer:  Janeann Forehand [id number]      Follow up plan: Return in about 4 weeks (around 12/12/2015) for anxiety. Marland Kitchen

## 2015-11-14 NOTE — Assessment & Plan Note (Signed)
Treated with miralax at home. Pt is not amenable to linzess or amitiza to help with daily constipation. Encouraged fluids and exercise to help with symptoms.

## 2015-11-14 NOTE — Assessment & Plan Note (Signed)
Start Buspar per patient preference. Take 7.5mg  BID. Encouraged deep breathing and mindfulness. Requesting [facility] records for their recommendations. RTC 1 mos.

## 2015-11-17 ENCOUNTER — Ambulatory Visit (INDEPENDENT_AMBULATORY_CARE_PROVIDER_SITE_OTHER): Payer: BLUE CROSS/BLUE SHIELD | Admitting: Cardiology

## 2015-11-17 ENCOUNTER — Encounter (INDEPENDENT_AMBULATORY_CARE_PROVIDER_SITE_OTHER): Payer: Self-pay

## 2015-11-17 ENCOUNTER — Encounter: Payer: Self-pay | Admitting: Cardiology

## 2015-11-17 VITALS — BP 100/72 | HR 83 | Ht 62.0 in | Wt 144.5 lb

## 2015-11-17 DIAGNOSIS — I951 Orthostatic hypotension: Secondary | ICD-10-CM | POA: Diagnosis not present

## 2015-11-17 DIAGNOSIS — R079 Chest pain, unspecified: Secondary | ICD-10-CM | POA: Diagnosis not present

## 2015-11-17 DIAGNOSIS — R55 Syncope and collapse: Secondary | ICD-10-CM

## 2015-11-17 NOTE — Progress Notes (Signed)
Cardiology Office Note   Date:  11/17/2015   ID:  Cindy QuestMicaela Lansky, DOB 06-Sep-1980, MRN [id number]  Referring Doctor:  Filbert BertholdAmy L Krebs, NP   Cardiologist:   Almond LintAileen Olar Santini, MD   Reason for consultation:  Chief Complaint  Patient presents with  . other    Follow up from Echo.      History of Present Illness: Cindy Bonilla is a 35 y.o. female who presents for episodes of dizziness and passing out.  Patient is here for follow-up after tests.  Since last visit, she has started to increase hydration and make sure that she eats and time. She started having an episode of chest pain sharp pain, mild intensity, nonradiating, randomly occurring. [facility] and was diagnosed 7 Zaidi.  Patient denies recurrence of chest pain, headache, fever, cough, colds, abdominal pain. She does have urinary incontinence. No PND orthopnea or edema.   ROS:  Please see the history of present illness. Aside from mentioned under HPI, all other systems are reviewed and negative.    Past Medical History  Diagnosis Date  . Arthritis   . Urinary urgency   . Depression     Past Surgical History  Procedure Laterality Date  . None       reports that she has quit smoking. She has never used smokeless tobacco. She reports that she drinks alcohol. She reports that she does not use illicit drugs.   family history includes Aneurysm in her brother; Depression in her mother; Diabetes in her mother. There is no history of Bladder Cancer, Prostate cancer, or Kidney cancer.   Current Outpatient Prescriptions  Medication Sig Dispense Refill  . bisacodyl (DULCOLAX) 5 MG EC tablet Take 1 tablet (5 mg total) by mouth at bedtime as needed for moderate constipation. For 1 week. 30 tablet 0  . busPIRone (BUSPAR) 7.5 MG tablet Take 1 tablet (7.5 mg total) by mouth 2 (two) times daily. 60 tablet 11  . Norgestrel-Ethinyl Estradiol (CRYSELLE-28 PO) Take by mouth.     No current facility-administered medications for  this visit.    Allergies: Motrin and Latex    PHYSICAL EXAM: VS:  BP 100/72 mmHg  Pulse 83  Ht 5\' 2"  (1.575 m)  Wt 144 lb 8 oz (65.545 kg)  BMI 26.42 kg/m2  LMP 11/10/2015 , Body mass index is 26.42 kg/(m^2). Wt Readings from Last 3 Encounters:  11/17/15 144 lb 8 oz (65.545 kg)  11/14/15 149 lb (67.586 kg)  10/18/15 141 lb (63.957 kg)    Orthostatic VS for the past 24 hrs (Last 3 readings):  BP- Lying Pulse- Lying BP- Sitting Pulse- Sitting BP- Standing at 0 minutes Pulse- Standing at 0 minutes BP- Standing at 3 minutes Pulse- Standing at 3 minutes  11/17/15 1019 116/82 mmHg 90 117/78 mmHg 92 119/83 mmHg 106 111/76 mmHg 92    GENERAL:  well developed, well nourished, not in acute distress HEENT: normocephalic, pink conjunctivae, anicteric sclerae, no xanthelasma, normal dentition, oropharynx clear NECK:  no neck vein engorgement, JVP normal, no hepatojugular reflux, carotid upstroke brisk and symmetric, no bruit, no thyromegaly, no lymphadenopathy LUNGS:  good respiratory effort, clear to auscultation bilaterally CV:  PMI not displaced, no thrills, no lifts, S1 and S2 within normal limits, no palpable S3 or S4, no murmurs, no rubs, no gallops ABD:  Soft, nontender, nondistended, normoactive bowel sounds, no abdominal aortic bruit, no hepatomegaly, no splenomegaly MS: nontender back, no kyphosis, no scoliosis, no joint deformities EXT:  2+ DP/PT pulses, no edema, no varicosities, no cyanosis, no clubbing SKIN: warm, nondiaphoretic, normal turgor, no ulcers NEUROPSYCH: alert, oriented to person, place, and time, sensory/motor grossly intact, normal mood, appropriate affect  Recent Labs: 10/13/2015: Hemoglobin 14.4; Platelets 236 10/18/2015: ALT 36*; BUN 6; Creatinine, Ser 0.87; Potassium 4.2; Sodium 142; TSH 0.312*   Lipid Panel    Component Value Date/Time   CHOL 156 10/18/2015 1047   TRIG 97 10/18/2015 1047   HDL 48 10/18/2015 1047   CHOLHDL 3.3 10/18/2015 1047   LDLCALC 89  10/18/2015 1047     Other studies Reviewed:  EKG:  The ekg from 10/14/2015 was personally reviewed by me and it revealed sinus rhythm  Additional studies/ records that were reviewed personally reviewed by me today include:  Echocardiogram 11/01/2015: Left ventricle: The cavity size was normal. Systolic function was  normal. The estimated ejection fraction was in the range of 60%  to 65%. Wall motion was normal; there were no regional wall  motion abnormalities. Left ventricular diastolic function  parameters were normal. - Left atrium: The atrium was normal in size. - Right ventricle: Systolic function was normal. - Pulmonary arteries: Systolic pressure was within the normal  range. - Inferior vena cava: The vessel was normal in size. The  respirophasic diameter changes were in the normal range (>= 50%),  consistent with normal central venous pressure.  Holter monitor 10/25/2015: Overall rhythm was sinus, average of 91 BPM. Minimum heart rate 58 bpm at 2330 10/25/2015. Maximum heart rate sinus tachycardia 177 BPM in 2038 10/25/2015.  No supraventricular ectopy.  No high-grade ventricular ectopy: 4 isolated PVCs, for bigeminal cycles.  No evidence of AV conduction abnormality, no evidence of atrial fibrillation.       ASSESSMENT AND PLAN:  Episodes of loss of consciousness From her history, episodes are most likely related to orthostatic changes with upright position/orthostatic in nature. On initial visit, With orthostatic vital signs, there is a note of significant heart rate increase with change in position. Patient could not really complete the orthostatic vital signs due to dizziness with sitting up and standing up. Orthostatic vital signs today are improved. Echocardiogram and Holter monitor are unremarkable. Patient reassured  Patient advised to remain well hydrated, adequate food intake, and use of compression stockings, avoid prolonged standing.  Chest  pain Atypical in nature. To rule out ischemia, recommend stress echocardiogram. Patient to follow-up in our office if any abnormality on stress echo cardiac event.  Current medicines are reviewed at length with the patient today.  The patient does not have concerns regarding medicines.  Labs/ tests ordered today include:  Orders Placed This Encounter  Procedures  . EKG 12-Lead  . ECHO STRESS TEST    I had a lengthy and detailed discussion with the patient regarding diagnoses, prognosis, diagnostic options, treatment options .   I counseled the patient on importance of lifestyle modification including heart healthy diet, regular physical activity .   Disposition:   FU with undersigned When necessary Signed, Almond Lint, MD  11/17/2015 10:48 AM    [facility]

## 2015-11-17 NOTE — Patient Instructions (Addendum)
EKG:  The ekg from 10/14/2015 was personally reviewed by me and it revealed sinus rhythm  Echocardiogram 11/01/2015: Left ventricle: The cavity size was normal. Systolic function was  normal. The estimated ejection fraction was in the range of 60%  to 65%. Wall motion was normal; there were no regional wall  motion abnormalities. Left ventricular diastolic function  parameters were normal.  Holter monitor 10/25/2015: Overall rhythm was sinus No supraventricular ectopy No high-As needed in thegrade ventricular ectopy No evidence of AV conduction abnormality, no evidence of atrial fibrillation.     ASSESSMENT AND PLAN:  Episodes of loss of consciousness From her history, episodes are most likely related to orthostatic changes with upright position/orthostatic in nature. On initial visit, With orthostatic vital signs, there is a note of significant heart rate increase with change in position. Patient could not really complete the orthostatic vital signs due to dizziness with sitting up and standing up. Orthostatic vital signs today are improved. Echocardiogram and Holter monitor are unremarkable. Patient reassured  Patient advised to remain well hydrated, adequate food intake, and use of compression stockings, avoid prolonged standing.  Chest pain Atypical in nature. To rule out ischemia, recommend stress echocardiogram. Patient to follow-up in our office if any abnormality on stress echo cardiac event.  Current medicines are reviewed at length with the patient today.  The patient does not have concerns regarding medicines.  Labs/ tests ordered today include:  Orders Placed This Encounter  Procedures  . EKG 12-Lead  . ECHO STRESS TEST    I had a lengthy and detailed discussion with the patient regarding diagnoses, prognosis, diagnostic options, treatment options .   I counseled the patient on importance of lifestyle modification including heart healthy diet, regular  physical activity .   Disposition:   FU with undersigned When necessary]  Signed, Almond Lint, MD  11/17/2015 10:48 AM    [facility]       rates    Medication Instructions:  Your physician recommends that you continue on your current medications as directed. Please refer to the Current Medication list given to you today.   Labwork: None ordered  Testing/Procedures: Your physician has requested that you have a stress echocardiogram. For further information please visit [email or web address] Please follow instruction sheet as given.  Date & Time: _____________________________________________________________  Follow-Up: Your physician recommends that you schedule a follow-up appointment as needed. We will call you with results and if needed make a follow up at that time.     Any Other Special Instructions Will Be Listed Below (If Applicable).     If you need a refill on your cardiac medications before your next appointment, please call your pharmacy.  Exercise Stress Echocardiogram An exercise stress echocardiogram is a heart (cardiac) test used to check the function of your heart. This test may also be called an exercise stress echocardiography or stress echo. This stress test will check how well your heart muscle and valves are working and determine if your heart muscle is getting enough blood. You will exercise on a treadmill to naturally increase or stress the functioning of your heart.  An echocardiogram uses sound waves (ultrasound) to produce an image of your heart. If your heart does not work normally, it may indicate coronary artery disease with poor coronary blood supply. The coronary arteries are the arteries that bring blood and oxygen to your heart. LET [facility] CARE PROVIDER KNOW ABOUT:  Any allergies you have.  All medicines you are  taking, including vitamins, herbs, eye drops, creams, and over-the-counter medicines.  Previous  problems you or members of your family have had with the use of anesthetics.  Any blood disorders you have.  Previous surgeries you have had.  Medical conditions you have.  Possibility of pregnancy, if this applies. RISKS AND COMPLICATIONS Generally, this is a safe procedure. However, as with any procedure, complications can occur. Possible complications can include:  You develop pain or pressure in the following areas:  Chest.  Jaw or neck.  Between your shoulder blades.  Radiating down your left arm.  Dizziness or lightheadedness.  Shortness of breath.  Increased or irregular heartbeat.  Nausea or vomiting.  Heart attack (rare). BEFORE THE PROCEDURE  Avoid all forms of caffeine for 24 hours before your test or as directed by your health care provider. This includes coffee, tea (even decaffeinated tea), caffeinated sodas, chocolate, cocoa, and certain pain medicines.  Follow your health care provider's instructions regarding eating and drinking before the test.  Take your medicines as directed at regular times with water unless instructed otherwise. Exceptions may include:  If you have diabetes, ask how you are to take your insulin or pills. It is common to adjust insulin dosing the morning of the test.  If you are taking beta-blocker medicines, it is important to talk to your health care provider about these medicines well before the date of your test. Taking beta-blocker medicines may interfere with the test. In some cases, these medicines need to be changed or stopped 24 hours or more before the test.  If you wear a nitroglycerin patch, it may need to be removed prior to the test. Ask your health care provider if the patch should be removed before the test.  If you use an inhaler for any breathing condition, bring it with you to the test.  If you are an outpatient, bring a snack so you can eat right after the stress phase of the test.  Do not smoke for 4 hours  prior to the test or as directed by your health care provider.  Wear loose-fitting clothes and comfortable shoes for the test. This test involves walking on a treadmill. PROCEDURE   Multiple electrodes will be put on your chest. If needed, small areas of your chest may be shaved to get better contact with the electrodes. Once the electrodes are attached to your body, multiple wires will be attached to the electrodes, and your heart rate will be monitored.  You will have an echocardiogram done at rest.  To produce this image of your heart, gel is applied to your chest, and a wand-like tool (transducer) is moved over the chest. The transducer sends the sound waves through the chest to create the moving images of your heart.  You may need an IV to receive a medication that improves the quality of the pictures.  You will then walk on a treadmill. The treadmill will be started at a slow pace. The treadmill speed and incline will gradually be increased to raise your heart rate.  At the peak of exercise, the treadmill will be stopped. You will lie down immediately on a bed so that a second echocardiogram can be done to visualize your heart's motion with exercise.  The test usually takes 30-60 minutes to complete. AFTER THE PROCEDURE  Your heart rate and blood pressure will be monitored after the test.  You may return to your normal schedule, including diet, activities, and medicines, unless your health care  provider tells you otherwise.   This information is not intended to replace advice given to you by your health care provider. Make sure you discuss any questions you have with your health care provider.   Document Released: 06/15/2004 Document Revised: 06/16/2013 Document Reviewed: 02/16/2013 Elsevier Interactive Patient Education [employer]

## 2015-11-18 NOTE — Addendum Note (Signed)
Addended by: Bryna ColanderALLEN, Natoshia Souter S on: 11/18/2015 09:25 AM   Modules accepted: Orders

## 2015-11-22 ENCOUNTER — Other Ambulatory Visit: Payer: BLUE CROSS/BLUE SHIELD

## 2015-12-16 ENCOUNTER — Encounter: Payer: Self-pay | Admitting: Family Medicine

## 2015-12-16 ENCOUNTER — Ambulatory Visit (INDEPENDENT_AMBULATORY_CARE_PROVIDER_SITE_OTHER): Payer: BLUE CROSS/BLUE SHIELD | Admitting: Family Medicine

## 2015-12-16 VITALS — BP 101/63 | HR 60 | Temp 98.3°F | Resp 16 | Ht 62.0 in | Wt 153.0 lb

## 2015-12-16 DIAGNOSIS — R55 Syncope and collapse: Secondary | ICD-10-CM

## 2015-12-16 DIAGNOSIS — R42 Dizziness and giddiness: Secondary | ICD-10-CM | POA: Diagnosis not present

## 2015-12-16 DIAGNOSIS — F419 Anxiety disorder, unspecified: Secondary | ICD-10-CM

## 2015-12-16 MED ORDER — MECLIZINE HCL 25 MG PO TABS: 25.0000 mg | ORAL_TABLET | Freq: Three times a day (TID) | ORAL | Status: DC | PRN

## 2015-12-16 MED ORDER — BUSPIRONE HCL 10 MG PO TABS: 10.0000 mg | ORAL_TABLET | Freq: Two times a day (BID) | ORAL | Status: DC

## 2015-12-16 NOTE — Patient Instructions (Signed)
We will increase your buspar today. Take 1 10mg  pill at bedtime and continue to take your 7.5mg  pill in the AM for 2 or 3 days. Then take 10mg  twice daily.   Try meclizine for dizziness. It will make you sleepy- so try taking when you are at home.

## 2015-12-16 NOTE — Progress Notes (Signed)
Subjective:    Patient ID: Cindy Bonilla, female    DOB: [birth year], 35 y.o.   MRN: [id number]  HPI: Cindy Bonilla is a 35 y.o. female presenting on 12/16/2015 for Anxiety and Dizziness   HPI  Pt presents for follow-up of dizziness and anxiety. Pt is still dizzy at times but her cardiac work-up is normal. Anxiety is doing better. She was initially sleepy with buspar but tolerating well. Would like to increase dose.  Dizziness is provoked by long periods of standing. Provoked when standing after sitting for long periods or with quick position changes. She states dizziness feels like a fog over her eyes- and then like she could pass out. Dizziness still occurs a few times per week.   Past Medical History  Diagnosis Date  . Arthritis   . Urinary urgency   . Depression     Current Outpatient Prescriptions on File Prior to Visit  Medication Sig  . bisacodyl (DULCOLAX) 5 MG EC tablet Take 1 tablet (5 mg total) by mouth at bedtime as needed for moderate constipation. For 1 week.  . Norgestrel-Ethinyl Estradiol (CRYSELLE-28 PO) Take by mouth.   No current facility-administered medications on file prior to visit.    Review of Systems  Constitutional: Negative for fever and chills.  HENT: Negative.   Respiratory: Negative for cough, chest tightness and wheezing.   Cardiovascular: Negative for chest pain and leg swelling.  Gastrointestinal: Negative for nausea, vomiting, abdominal pain, diarrhea and constipation.  Endocrine: Negative.  Negative for cold intolerance, heat intolerance, polydipsia, polyphagia and polyuria.  Genitourinary: Negative for dysuria and difficulty urinating.  Musculoskeletal: Negative.   Neurological: Negative for dizziness, light-headedness and numbness.  Psychiatric/Behavioral: Negative for suicidal ideas. The patient is nervous/anxious.    Per HPI unless specifically indicated above     Objective:    BP 101/63 mmHg  Pulse 60  Temp(Src) 98.3 F (36.8  C) (Oral)  Resp 16  Ht 5\' 2"  (1.575 m)  Wt 153 lb (69.4 kg)  BMI 27.98 kg/m2  Wt Readings from Last 3 Encounters:  12/16/15 153 lb (69.4 kg)  11/17/15 144 lb 8 oz (65.545 kg)  11/14/15 149 lb (67.586 kg)    GAD 7 : Generalized Anxiety Score 12/16/2015  Nervous, Anxious, on Edge 1  Control/stop worrying 1  Worry too much - different things 1  Trouble relaxing 1  Restless 1  Easily annoyed or irritable 2  Afraid - awful might happen 1  Total GAD 7 Score 8  Anxiety Difficulty Somewhat difficult    Physical Exam  Constitutional: She is oriented to person, place, and time. She appears well-developed and well-nourished.  HENT:  Head: Normocephalic and atraumatic.  Right Ear: Hearing and tympanic membrane normal.  Left Ear: Hearing and tympanic membrane normal.  Nose: Nose normal.  Mouth/Throat: Uvula is midline, oropharynx is clear and moist and mucous membranes are normal.  Weber no lateralization.  Rinne: AC> BC  Eyes: Conjunctivae are normal. Pupils are equal, round, and reactive to light. Right eye exhibits normal extraocular motion and no nystagmus. Left eye exhibits normal extraocular motion and no nystagmus.  Neck: Neck supple.  Cardiovascular: Normal rate, regular rhythm and normal heart sounds.  Exam reveals no gallop and no friction rub.   No murmur heard. Pulmonary/Chest: Effort normal and breath sounds normal. She has no wheezes. She exhibits no tenderness.  Abdominal: Soft. Normal appearance and bowel sounds are normal. She exhibits no distension and no mass. There is no tenderness. There  is no rebound and no guarding.  Musculoskeletal: Normal range of motion. She exhibits no edema or tenderness.  Lymphadenopathy:    She has no cervical adenopathy.  Neurological: She is alert and oriented to person, place, and time.  Skin: Skin is warm and dry.  Psychiatric: She has a normal mood and affect. Her speech is normal and behavior is normal. Judgment and thought content  normal. Cognition and memory are normal.   Results for orders placed or performed in visit on 11/14/15  POCT urinalysis dipstick  Result Value Ref Range   Color, UA yellow    Clarity, UA clear    Glucose, UA negative    Bilirubin, UA negative    Ketones, UA negative    Spec Grav, UA 1.010    Blood, UA negative    pH, UA 5.0    Protein, UA negative    Urobilinogen, UA negative    Nitrite, UA negative    Leukocytes, UA Negative Negative      Assessment & Plan:   Problem List Items Addressed This Visit      Cardiovascular and Mediastinum   Syncopal episodes    Negative cardiac work-up. Thought to be 2/2 position changes. Discussed position changing safety with patient, importance of staying hydrated.         Other   Anxiety - Primary    Improved with buspar. Will increase dose to 10mg  BID. Recheck 1 mos.       Relevant Medications   busPIRone (BUSPAR) 10 MG tablet    Other Visit Diagnoses    Dizziness        Work-up is not suspicious for vertigo likely 2/2 position changes. However will try meclizine to see if it helps. Consider ENT referral if continues.     Relevant Medications    meclizine (ANTIVERT) 25 MG tablet       Meds ordered this encounter  Medications  . busPIRone (BUSPAR) 10 MG tablet    Sig: Take 1 tablet (10 mg total) by mouth 2 (two) times daily.    Dispense:  60 tablet    Refill:  11    Order Specific Question:  Supervising Provider    Answer:  Janeann ForehandHAWKINS JR, JAMES H [phone][[id number]]  . meclizine (ANTIVERT) 25 MG tablet    Sig: Take 1 tablet (25 mg total) by mouth 3 (three) times daily as needed for dizziness.    Dispense:  30 tablet    Refill:  2    Order Specific Question:  Supervising Provider    Answer:  Janeann ForehandHAWKINS JR, JAMES H [[id number]]      Follow up plan: Return in about 4 weeks (around 01/13/2016) for anxiety. .Marland Kitchen

## 2015-12-16 NOTE — Assessment & Plan Note (Signed)
Improved with buspar. Will increase dose to 10mg  BID. Recheck 1 mos.

## 2015-12-16 NOTE — Assessment & Plan Note (Signed)
Negative cardiac work-up. Thought to be 2/2 position changes. Discussed position changing safety with patient, importance of staying hydrated.

## 2016-01-13 ENCOUNTER — Ambulatory Visit: Payer: BLUE CROSS/BLUE SHIELD | Admitting: Family Medicine

## 2016-03-29 ENCOUNTER — Telehealth: Payer: Self-pay | Admitting: Family Medicine

## 2016-03-29 MED ORDER — NORGESTREL-ETHINYL ESTRADIOL 0.3-30 MG-MCG PO TABS: 1.0000 | ORAL_TABLET | Freq: Every day | ORAL | 11 refills | Status: DC

## 2016-03-29 NOTE — Telephone Encounter (Signed)
[facility] to [facility]

## 2016-03-29 NOTE — Telephone Encounter (Signed)
[facility]  Loraine LericheMark called and his called back number is  [phone]. For a refill on Norgestrel- Ethinyl Estradiol (Cryselle- 28 PO) for patient Charlsie QuestMicaela Tinnell

## 2016-04-18 ENCOUNTER — Ambulatory Visit (INDEPENDENT_AMBULATORY_CARE_PROVIDER_SITE_OTHER): Payer: BLUE CROSS/BLUE SHIELD | Admitting: *Deleted

## 2016-04-18 VITALS — BP 100/70 | HR 61 | Temp 98.5°F

## 2016-04-18 DIAGNOSIS — Z23 Encounter for immunization: Secondary | ICD-10-CM | POA: Diagnosis not present

## 2016-04-18 NOTE — Patient Instructions (Signed)
Return 04/20/16 for tb skin read.

## 2016-04-18 NOTE — Progress Notes (Signed)
Left forearm cleaned with alcohol. 0.1 injected into arm. Patient informed she needs to return on Friday for tb read.

## 2016-04-20 LAB — TB SKIN TEST
Induration: 0 mm
TB SKIN TEST: NEGATIVE

## 2016-11-20 ENCOUNTER — Ambulatory Visit (INDEPENDENT_AMBULATORY_CARE_PROVIDER_SITE_OTHER): Payer: BLUE CROSS/BLUE SHIELD | Admitting: Nurse Practitioner

## 2016-11-20 ENCOUNTER — Encounter: Payer: Self-pay | Admitting: Nurse Practitioner

## 2016-11-20 VITALS — BP 98/71 | HR 86 | Temp 97.0°F | Resp 16 | Wt 159.6 lb

## 2016-11-20 DIAGNOSIS — F419 Anxiety disorder, unspecified: Secondary | ICD-10-CM

## 2016-11-20 DIAGNOSIS — M25562 Pain in left knee: Secondary | ICD-10-CM

## 2016-11-20 MED ORDER — BUSPIRONE HCL 15 MG PO TABS: 15.0000 mg | ORAL_TABLET | Freq: Two times a day (BID) | ORAL | 5 refills | Status: DC

## 2016-11-20 NOTE — Patient Instructions (Addendum)
Cindy Bonilla, Thank you for coming in to clinic today.  1. For your anxiety and difficulty sleeping: - START buspar 15 mg twice daily.  If worsening dreams, we will reduce your dose and try something different. - If not helping for sleep, we can try adding a different medication instead of increasing the dose.  Let me know if not improving or worsening. - CONTINUE your exercise and finding tools for coping skills.  One may be mindfulness breaths or the quick body scan.  2. Sign a release of information for your health department records for your physical.  3. For your knee pain, resume your PT for stabilizing your knee joint.  Start today with the exercises you already know for home PT.  Please schedule a follow-up appointment with Wilhelmina McardleLauren Sheray Grist, AGNP to Return in about 4 weeks (around 12/18/2016) for anxiety and sleep.  If you have any other questions or concerns, please feel free to call the clinic or send a message through MyChart. You may also schedule an earlier appointment if necessary.  Wilhelmina McardleLauren Allora Bains, DNP, AGNP-BC Adult Gerontology Nurse Practitioner [facility], [facility]   Patellofemoral Pain Syndrome Patellofemoral pain syndrome is a condition that involves a softening or breakdown of the tissue (cartilage) on the underside of your kneecap (patella). This causes pain in the front of the knee. The condition is also called runner's knee or chondromalacia patella. Patellofemoral pain syndrome is most common in young adults who are active in sports. Your knee is the largest joint in your body. The patella covers the front of your knee and is attached to muscles above and below your knee. The underside of the patella is covered with a smooth type of cartilage (synovium). The smooth surface helps the patella glide easily when you move your knee. Patellofemoral pain syndrome causes swelling in the joint linings and bone surfaces in your knee. What are the causes? Patellofemoral  pain syndrome can be caused by:  Overuse.  Poor alignment of your knee joints.  Weak leg muscles.  A direct blow to your kneecap. What increases the risk? You may be at risk for patellofemoral pain syndrome if you:  Do a lot of activities that can wear down your kneecap. These include:  Running.  Squatting.  Climbing stairs.  Start a new physical activity or exercise program.  Wear shoes that do not fit well.  Do not have good leg strength.  Are overweight. What are the signs or symptoms? Knee pain is the most common symptom of patellofemoral pain syndrome. This may feel like a dull, aching pain underneath your patella, in the front of your knee. There may be a popping or cracking sound when you move your knee. Pain may get worse with:  Exercise.  Climbing stairs.  Running.  Jumping.  Squatting.  Kneeling.  Sitting for a long time.  Moving or pushing on your patella. How is this diagnosed? Your health care provider may be able to diagnose patellofemoral pain syndrome from your symptoms and medical history. You may be asked about your recent physical activities and which ones cause knee pain. Your health care provider may do a physical exam with certain tests to confirm the diagnosis. These may include:  Moving your patella back and forth.  Checking your range of knee motion.  Having you squat or jump to see if you have pain.  Checking the strength of your leg muscles. An MRI of the knee may also be done. How is this treated? Patellofemoral  pain syndrome can usually be treated at home with rest, ice, compression, and elevation (RICE). Other treatments may include:  Nonsteroidal anti-inflammatory drugs (NSAIDs).  Physical therapy to stretch and strengthen your leg muscles.  Shoe inserts (orthotics) to take stress off your knee.  A knee brace or knee support.  Surgery to remove damaged cartilage or move the patella to a better position. The need for  surgery is rare. Follow these instructions at home:  Take medicines only as directed by your health care provider.  Rest your knee.  When resting, keep your knee raised above the level of your heart.  Avoid activities that cause knee pain.  Apply ice to the injured area:  Put ice in a plastic bag.  Place a towel between your skin and the bag.  Leave the ice on for 20 minutes, 2-3 times a day.  Use splints, braces, knee supports, or walking aids as directed by your health care provider.  Perform stretching and strengthening exercises as directed by your health care provider or physical therapist.  Keep all follow-up visits as directed by your health care provider. This is important. Contact a health care provider if:  Your symptoms get worse.  You are not improving with home care. This information is not intended to replace advice given to you by your health care provider. Make sure you discuss any questions you have with your health care provider. Document Released: 05/30/2009 Document Revised: 11/17/2015 Document Reviewed: 08/31/2013 Elsevier Interactive Patient Education  2017 [employer].

## 2016-11-20 NOTE — Progress Notes (Signed)
Subjective:    Patient ID: Cindy Bonilla, female    DOB: 05/09/1981, 36 y.o.   MRN: [id number]  Cindy Bonilla is a 36 y.o. female presenting on 11/20/2016 for Anxiety (Patient here today to FU on anxiety, last ov was on 12/16/15 with Amy. Patient was advised to increase Buspar to 10 mg BID and fu in 1 month. )   HPI  Anxiety Last seen in clinic on 12/16/15  Having nightmares.  Started 4-5 months ago.  Pt believes that it may be medication (buspar).  Having headaches when not taking buspar or misses her dose.  Some nights is hard to sleep.  Had initially helped with sleep with no problems.  Now racing thoughts even with taking buspar now.  Has tried melatonin.  Last used 2 weeks ago w/ dose of 10 mg and is not helping.  Was wanting to quit work r/t anxiety attack.  Husband didn't want her to have relapse of severe and paralyzing anxiety.  Pt states she lLikes the job, prayed about her difficulty, and now things are going better.    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    Eats healthy, but no change in appetite.  Prior anxiety episode 1 year ago she had anorexia w/o awareness of not eating/weight loss.  Exercise outside of work daily. Denies SI/HI and has no plans to carry out if SI/HI arise.   Left Knee Pain Pt states she is also having some knee pain - Patellofemoral syndrome in past and used PT.  Helped at that time, but she is no longer doing her exercises regularly.  Knee pain is described as soreness around knee at thigh.    GAD 7 : Generalized Anxiety Score 11/20/2016 12/16/2015  Nervous, Anxious, on Edge 3 1  Control/stop worrying 3 1  Worry too much - different things 3 1  Trouble relaxing 0 1  Restless 1 1  Easily annoyed or irritable 0 2  Afraid - awful might happen 1 1  Total GAD 7 Score 11 8  Anxiety Difficulty Not difficult at all Somewhat difficult    Social History  Substance Use Topics  . Smoking status: Former Games developer  . Smokeless tobacco: Never Used  . Alcohol  use 0.0 oz/week     Comment: occasional    Review of Systems  Constitutional: Negative.   Psychiatric/Behavioral: Negative for self-injury and suicidal ideas. The patient is nervous/anxious.    Per HPI unless specifically indicated above     Objective:    BP 98/71 (BP Location: Left Arm, Patient Position: Sitting, Cuff Size: Normal)   Pulse 86   Temp 97 F (36.1 C) (Oral)   Resp 16   Wt 159 lb 9.6 oz (72.4 kg)   LMP 11/06/2016 (Approximate)   SpO2 100%   BMI 29.19 kg/m   Wt Readings from Last 3 Encounters:  11/20/16 159 lb 9.6 oz (72.4 kg)  12/16/15 153 lb (69.4 kg)  11/17/15 144 lb 8 oz (65.5 kg)    Physical Exam  Constitutional: She is oriented to person, place, and time. She appears well-developed and well-nourished. No distress.  HENT:  Head: Normocephalic and atraumatic.  Cardiovascular: Normal rate, regular rhythm and normal heart sounds.   Pulmonary/Chest: Effort normal and breath sounds normal. No respiratory distress. She has no wheezes.  Abdominal: Soft. Bowel sounds are normal.  Musculoskeletal: Normal range of motion. She exhibits edema and tenderness.  Left knee Inspection: medial joint line edema Palpation: tenderness of soft tissues.  No bony abnormalities or bony tenderness ROM: normal PROM and AROM Special Testing: negative anterior and posterior drawer test. Negative varus and valgus laxity.  Normal patellar movement. Strength: normal Neurovascular: normal sensation   Neurological: She is alert and oriented to person, place, and time. No cranial nerve deficit.  Skin: Skin is warm and dry.  Psychiatric: Her speech is normal and behavior is normal. Judgment and thought content normal. Her mood appears anxious. She expresses no homicidal and no suicidal ideation. She expresses no suicidal plans and no homicidal plans.  Vitals reviewed.        Assessment & Plan:   Problem List Items Addressed This Visit      Other   Anxiety - Primary    Pt with  stable anxiety for several months after last visit.  Worsening anxiety and sleep habits over last 4-5 months.  Pt notes improvement with buspar, but has had recent life stresses that have made symptoms of anxiousness and insomnia worse.  Plan: 1. Discussed with patient option to increase buspar or add second agent. Pt prefers to maximize buspar first. - START buspar 15 mg bid. 2. Continue good diet and regular exercise. 3. Continue with faith based support system and family support. 4. Follow up 4 weeks.      Relevant Medications   busPIRone (BUSPAR) 15 MG tablet    Other Visit Diagnoses    Acute pain of left knee       Recurrent pain similar to past patellofemoral pain of left knee.  Prior treatment with PT.  Pt desires same plan: 1. [facility] order for eval and treat       Meds ordered this encounter  Medications  . busPIRone (BUSPAR) 15 MG tablet    Sig: Take 1 tablet (15 mg total) by mouth 2 (two) times daily.    Dispense:  60 tablet    Refill:  5    Order Specific Question:   Supervising Provider    Answer:   Smitty CordsKARAMALEGOS, ALEXANDER J [[id number]]      Follow up plan: Return in about 4 weeks (around 12/18/2016) for anxiety and sleep.   Wilhelmina McardleLauren Moustafa Mossa, DNP, AGPCNP-BC Adult Gerontology Primary Care Nurse Practitioner [facility] 11/22/2016, 8:39 PM

## 2016-11-22 NOTE — Progress Notes (Signed)
I have reviewed this encounter including the documentation in this note and/or discussed this patient with the provider, Wilhelmina McardleLauren Kennedy, AGPCNP-BC. I am certifying that I agree with the content of this note as supervising physician.  Saralyn PilarAlexander Karamalegos, DO [facility] 11/22/2016, 8:49 PM

## 2016-11-22 NOTE — Assessment & Plan Note (Signed)
Pt with stable anxiety for several months after last visit.  Worsening anxiety and sleep habits over last 4-5 months.  Pt notes improvement with buspar, but has had recent life stresses that have made symptoms of anxiousness and insomnia worse.  Plan: 1. Discussed with patient option to increase buspar or add second agent. Pt prefers to maximize buspar first. - START buspar 15 mg bid. 2. Continue good diet and regular exercise. 3. Continue with faith based support system and family support. 4. Follow up 4 weeks.

## 2016-12-04 ENCOUNTER — Telehealth: Payer: Self-pay | Admitting: Nurse Practitioner

## 2016-12-04 NOTE — Telephone Encounter (Signed)
Olegario MessierKathy at   [facility] called states they have called pt and left message for pt to schedule appt. Pt. Have not return there call

## 2016-12-04 NOTE — Telephone Encounter (Signed)
Referral to PT was option for patient for knee pain.  If not able to attend may consider in future.

## 2017-01-11 ENCOUNTER — Ambulatory Visit (INDEPENDENT_AMBULATORY_CARE_PROVIDER_SITE_OTHER): Payer: BLUE CROSS/BLUE SHIELD | Admitting: Nurse Practitioner

## 2017-01-11 ENCOUNTER — Encounter: Payer: Self-pay | Admitting: Nurse Practitioner

## 2017-01-11 VITALS — BP 90/56 | HR 63 | Temp 98.2°F | Ht 62.0 in | Wt 168.8 lb

## 2017-01-11 DIAGNOSIS — F419 Anxiety disorder, unspecified: Secondary | ICD-10-CM

## 2017-01-11 DIAGNOSIS — J0100 Acute maxillary sinusitis, unspecified: Secondary | ICD-10-CM

## 2017-01-11 MED ORDER — BUSPIRONE HCL 15 MG PO TABS: 15.0000 mg | ORAL_TABLET | Freq: Two times a day (BID) | ORAL | 5 refills | Status: DC

## 2017-01-11 MED ORDER — AMOXICILLIN 500 MG PO TABS: 500.0000 mg | ORAL_TABLET | Freq: Two times a day (BID) | ORAL | 0 refills | Status: AC

## 2017-01-11 NOTE — Progress Notes (Signed)
Subjective:    Patient ID: Cindy Bonilla, female    DOB: [birth year], 36 y.o.   MRN: [id number]  Cindy Bonilla is a 36 y.o. female presenting on 01/11/2017 for Sinus Problem (facial pressure, nasal drainage, and mild headache. The pt stated it started with a cough x 2-3 weeks. ) and Anxiety (insomnia)   HPI Anxiety Previously treated and increased buspirone from 10 mg bid to 15 mg bid.  Discussed may start second agent at later date. Has trouble sleeping.  Pt is unable to fall asleep easily and return to sleep when awakens.  She notes racing thoughts and inability to relax.  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   Pt states she feels much better despite GAD7 and PHQ 9 being same.   Sinus Started w/ cough and sneezing, runny nose 3 weeks ago.  Now still has cough at night.  Persistent sinus and nasal congestion/pressure and feels like pulling her nose off for relief. No fever/ chills/ sweats. Has taken sudafed pm and am over the counter (phenylephrine decongestant), Mucinex, Nyquil alternating depending on symptoms.   Social History  Substance Use Topics  . Smoking status: Former Games developermoker  . Smokeless tobacco: Never Used  . Alcohol use 0.0 oz/week     Comment: occasional    Review of Systems Per HPI unless specifically indicated above     Objective:    BP (!) 90/56 (BP Location: Right Arm, Patient Position: Sitting, Cuff Size: Normal)   Pulse 63   Temp 98.2 F (36.8 C) (Oral)   Ht 5\' 2"  (1.575 m)   Wt 168 lb 12.8 oz (76.6 kg)   SpO2 100%   BMI 30.87 kg/m    Wt Readings from Last 3 Encounters:  01/11/17 168 lb 12.8 oz (76.6 kg)  11/20/16 159 lb 9.6 oz (72.4 kg)  12/16/15 153 lb (69.4 kg)    Physical Exam  Constitutional: She is oriented to person, place, and time. She appears well-developed and well-nourished. No distress.  HENT:  Head: Normocephalic and atraumatic.  Right Ear: Hearing, tympanic  membrane, external ear and ear canal normal.  Left Ear: Hearing, tympanic membrane, external ear and ear canal normal.  Nose: Mucosal edema and rhinorrhea present. Right sinus exhibits maxillary sinus tenderness. Right sinus exhibits no frontal sinus tenderness. Left sinus exhibits maxillary sinus tenderness. Left sinus exhibits no frontal sinus tenderness.  Mouth/Throat: Uvula is midline, oropharynx is clear and moist and mucous membranes are normal.  Eyes: Pupils are equal, round, and reactive to light. Conjunctivae and EOM are normal.  Neck: Normal range of motion. Neck supple. No JVD present. No tracheal deviation present. No thyromegaly present.  Cardiovascular: Normal rate, regular rhythm, normal heart sounds and intact distal pulses.   Pulmonary/Chest: Effort normal and breath sounds normal. No respiratory distress.  Lymphadenopathy:    She has cervical adenopathy.  Neurological: She is alert and oriented to person, place, and time.  Skin: Skin is warm and dry.  Psychiatric: She has a normal mood and affect. Her behavior is normal. Judgment and thought content normal. She expresses no homicidal and no suicidal ideation.     Results for orders placed or performed in visit on 04/18/16  PPD  Result Value Ref Range   TB Skin Test Negative    Induration 0 mm      Assessment & Plan:   Problem List Items Addressed This Visit      Other  Anxiety - Primary    Gradual improvement in panic symptoms and anxiety noted by pt.  Pt with improved anxiety despite no improvement in GAD7 or PHQ9 scores.  Pt notes continued improvement with buspar.  Plan: 1. Continue buspar 15 mg bid. 2. Continue good diet and regular exercise. 3. Continue with faith based support system and family support. 4. Discussed non-pharm options for stress management to include deep breathing and body scan mindfulness strategy. 5. Follow up 3 months.      Relevant Medications   busPIRone (BUSPAR) 15 MG tablet      Other Visit Diagnoses     Acute non-recurrent maxillary sinusitis     Acute infectious process likely bacterial maxillary sinusitis given symptoms and duration of symptoms 3 weeks.  OTC medications provide some relief, but not continual improvement.  Plan: 1. Treat w/ amoxicillin 500 mg bid x 10 days. 2. Continue OTC medications as needed.  Recommend only one product w/ each active ingredient at one time.   3. OK to take decongestants w/ stable HR and BP. 4. Follow up as needed 5-7 days if no improvement.    Relevant Medications   amoxicillin (AMOXIL) 500 MG tablet      Meds ordered this encounter  Medications  . amoxicillin (AMOXIL) 500 MG tablet    Sig: Take 1 tablet (500 mg total) by mouth 2 (two) times daily.    Dispense:  20 tablet    Refill:  0    Order Specific Question:   Supervising Provider    Answer:   Smitty Cords [[id number]]  . busPIRone (BUSPAR) 15 MG tablet    Sig: Take 1 tablet (15 mg total) by mouth 2 (two) times daily.    Dispense:  60 tablet    Refill:  5    Order Specific Question:   Supervising Provider    Answer:   Smitty Cords [[id number]]      Follow up plan: Return in about 3 months (around 04/13/2017) for anxiety.   Wilhelmina Mcardle, DNP, AGPCNP-BC Adult Gerontology Primary Care Nurse Practitioner [facility] 01/11/2017, 10:40 AM

## 2017-01-11 NOTE — Patient Instructions (Addendum)
Cindy Bonilla, Thank you for coming in to clinic today.  1. For your sinuses: -It sounds like you have a Bacterial sinus infection.  Recommend good hand washing. - START taking amoxicillin 500 mg every 12 hours (twice daily) for 10 days.  Make sure to take all doses of your antibiotic. - While you are on an antibiotic, take a probiotic.  Antibiotics kill good and bad bacteria.  A probiotic helps to replace your good bacteria. Probiotic pills can be found over the counter.  One brand is Florastor, but you can use any brand you prefer.  You can also get good bacteria from foods.  One of these foods is yogurt.  - Drink plenty of fluids.  - Start loratadine/Claritin 10mg  daily. - You may also use Flonase 2 sprays each nostril daily for up to 4-6 weeks  Other over the counter medications you may try, if needed for symptoms are: - If congestion is worse, start OTC Mucinex (or may try Mucinex-DM for cough) up to 7-10 days then stop - You may try over the counter Nasal Saline spray (Simply Saline, Ocean Spray) as needed to reduce congestion. - Start taking Tylenol extra strength 1 to 2 tablets every 6-8 hours for aches or fever/chills for next few days as needed.  Do not take more than 3,000 mg in 24 hours from all medicines.  You may also take ibuprofen 200-400mg  every 8 hours as needed.   - Drink warm herbal tea with honey for sore throat.   If symptoms are significantly worse with persistent fevers/chills despite tylenol/ibpurofen, nausea, vomiting unable to tolerate food/fluids or medicine, body aches, or shortness of breath, sinus pain pressure or worsening productive cough, then follow-up for re-evaluation, may seek more immediate care at Urgent Care or the ED if you are more concerned that it is an emergency.   2. For your anxiety: - Continue your buspirone 15 mg bid.   - Continue your deep breathing.  Add your body scan as a tool. - Find your 5-10 minutes to recharge.  Please schedule a follow-up  appointment with Cindy Bonilla, AGNP to Return in about 3 months (around 04/13/2017) for anxiety.   If you have any other questions or concerns, please feel free to call the clinic or send a message through MyChart. You may also schedule an earlier appointment if necessary.  Cindy McardleLauren Beuford Garcilazo, DNP, AGNP-BC Adult Gerontology Nurse Practitioner [facility], [facility]

## 2017-01-14 NOTE — Assessment & Plan Note (Addendum)
Gradual improvement in panic symptoms and anxiety noted by pt.  Pt with improved anxiety despite no improvement in GAD7 or PHQ9 scores.  Pt notes continued improvement with buspar.  Plan: 1. Continue buspar 15 mg bid. 2. Continue good diet and regular exercise. 3. Continue with faith based support system and family support. 4. Discussed non-pharm options for stress management to include deep breathing and body scan mindfulness strategy. 5. Follow up 3 months.

## 2017-01-14 NOTE — Progress Notes (Signed)
I have reviewed this encounter including the documentation in this note and/or discussed this patient with the provider, Lauren Kennedy, AGPCNP-BC. I am certifying that I agree with the content of this note as supervising physician.  Koralee Wedeking, DO [facility] 01/14/2017, 12:31 PM 

## 2017-02-28 ENCOUNTER — Other Ambulatory Visit: Payer: Self-pay

## 2017-02-28 MED ORDER — NORGESTREL-ETHINYL ESTRADIOL 0.3-30 MG-MCG PO TABS: 1.0000 | ORAL_TABLET | Freq: Every day | ORAL | 2 refills | Status: DC

## 2017-04-12 ENCOUNTER — Ambulatory Visit (INDEPENDENT_AMBULATORY_CARE_PROVIDER_SITE_OTHER): Payer: BLUE CROSS/BLUE SHIELD | Admitting: Nurse Practitioner

## 2017-04-12 VITALS — BP 110/75 | HR 77 | Temp 98.4°F | Ht 62.0 in | Wt 174.6 lb

## 2017-04-12 DIAGNOSIS — F419 Anxiety disorder, unspecified: Secondary | ICD-10-CM | POA: Diagnosis not present

## 2017-04-12 DIAGNOSIS — G4709 Other insomnia: Secondary | ICD-10-CM

## 2017-04-12 MED ORDER — BUSPIRONE HCL 15 MG PO TABS: 15.0000 mg | ORAL_TABLET | Freq: Two times a day (BID) | ORAL | 5 refills | Status: DC

## 2017-04-12 MED ORDER — NORGESTREL-ETHINYL ESTRADIOL 0.3-30 MG-MCG PO TABS: 1.0000 | ORAL_TABLET | Freq: Every day | ORAL | 9 refills | Status: DC

## 2017-04-12 NOTE — Patient Instructions (Addendum)
Cindy Bonilla, Thank you for coming in to clinic today.  1. For your anxiety: - Continue your buspirone 15 mg tablet twice daily. - Continue your work for sleep hygiene.  Avoid electronic screens 30 minutes before you want to go to sleep.  Use radio/podcast noise if needed for background noise.  2.  For your allergies: - Try Zyrtec, Allegra, or Xyzal for over the counter antihistamine. - Can also use hydrocortisone cream (Cortisone10) for itching on your skin.  Please schedule a follow-up appointment with Wilhelmina McardleLauren Marialy Urbanczyk, AGNP. Return in about 6 months (around 10/11/2017) for anxiety.  If you have any other questions or concerns, please feel free to call the clinic or send a message through MyChart. You may also schedule an earlier appointment if necessary.  You will receive a survey after today's visit either digitally by e-mail or paper by [employer] mail. Your experiences and feedback matter to us.  Please respond so we know how we are doing as we provide care for you.   Wilhelmina McardleLauren Avion Kutzer, DNP, AGNP-BC Adult Gerontology Nurse Practitioner [facility], [facility]

## 2017-04-12 NOTE — Progress Notes (Signed)
Subjective:    Patient ID: Cindy Bonilla, female    DOB: 1981/05/16, 36 y.o.   MRN: [id number]  Cindy Bonilla is a 36 y.o. female presenting on 04/12/2017 for Anxiety   HPI Anxiety Has headache or nausea if not taking medicine.  Overall reports improved symptoms and functionality at work and home.  Pt does not desire to start any additional medications and feels she is managing well w/ other stress management techniques.  Insomnia About 3 nights per week has trouble falling asleep.   - Has a new job at [employer] and is doing much better there w/ stress, but is now working 2nd shift.  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 - Still using electronic screens at night for background noise.  GAD 7 : Generalized Anxiety Score 04/12/2017 01/11/2017 11/20/2016 12/16/2015  Nervous, Anxious, on Edge 1 1 3 1   Control/stop worrying 2 1 3 1   Worry too much - different things 2 1 3 1   Trouble relaxing 1 2 0 1  Restless 1 2 1 1   Easily annoyed or irritable 1 2 0 2  Afraid - awful might happen 1 1 1 1   Total GAD 7 Score 9 10 11 8   Anxiety Difficulty Somewhat difficult Somewhat difficult Not difficult at all Somewhat difficult    Depression screen [facility] 2/9 04/12/2017 01/11/2017 11/20/2016 10/10/2015  Decreased Interest 1 0 - 2  Down, Depressed, Hopeless 1 0 0 1  PHQ - 2 Score 2 0 0 3  Altered sleeping 1 2 2 3   Tired, decreased energy 2 2 3 3   Change in appetite 2 1 0 2  Feeling bad or failure about yourself  0 0 0 1  Trouble concentrating 1 0 0 2  Moving slowly or fidgety/restless 0 0 0 2  Suicidal thoughts 0 0 0 1  PHQ-9 Score 8 5 5 17   Difficult doing work/chores Somewhat difficult - Not difficult at all Somewhat difficult    Social History  Substance Use Topics  . Smoking status: Former Games developer  . Smokeless tobacco: Never Used  . Alcohol use 0.0 oz/week     Comment: occasional    Review of Systems Per HPI unless specifically  indicated above     Objective:    BP 110/75 (BP Location: Right Arm, Patient Position: Sitting, Cuff Size: Normal)   Pulse 77   Temp 98.4 F (36.9 C) (Oral)   Ht 5\' 2"  (1.575 m)   Wt 174 lb 9.6 oz (79.2 kg)   BMI 31.93 kg/m   Wt Readings from Last 3 Encounters:  04/12/17 174 lb 9.6 oz (79.2 kg)  01/11/17 168 lb 12.8 oz (76.6 kg)  11/20/16 159 lb 9.6 oz (72.4 kg)    Physical Exam  General - overweight, well-appearing, NAD HEENT - Normocephalic, atraumatic Heart - RRR, no murmurs heard Lungs - Clear throughout all lobes, no wheezing, crackles, or rhonchi. Normal work of breathing. Extremeties - non-tender, no edema, cap refill < 2 seconds, peripheral pulses intact +2 bilaterally Skin - warm, dry Neuro - awake, alert, oriented x3, normal gait, no active or resting tremor noted Psych - Normal mood and affect, normal behavior       Assessment & Plan:   Problem List Items Addressed This Visit      Other   Anxiety    Significant improvement in panic symptoms and anxiety noted by pt with her new job  PHQ9 and GAD  7 scores w/ some improvement, but is not consistent w/ pt reported functioning and improvement.  Plan: 1. Continue buspar 15 mg bid.  Pt refuses adding any adjunctive therapy for anxiety/insomnia. 2. Continue good diet and regular exercise. 3. Continue with faith based support system and family support. 4. Encouraged ongoing work w/ insomnia.  Suggested avoiding screens, using podcast/radio instead of TV for background noise. 5. Follow up 6 months.      Relevant Medications   busPIRone (BUSPAR) 15 MG tablet   Other insomnia - Primary    Insomnia associated w/ anxiety and shift work. See AP anxiety above.  Plan: 1. Encouraged good sleep hygiene practices. 2. Follow up 6 months.         Meds ordered this encounter  Medications  . norgestrel-ethinyl estradiol (CRYSELLE-28) 0.3-30 MG-MCG tablet    Sig: Take 1 tablet by mouth daily.    Dispense:  1 Package     Refill:  9    Order Specific Question:   Supervising Provider    Answer:   Smitty CordsKARAMALEGOS, ALEXANDER J [[id number]]  . busPIRone (BUSPAR) 15 MG tablet    Sig: Take 1 tablet (15 mg total) by mouth 2 (two) times daily.    Dispense:  60 tablet    Refill:  5    Order Specific Question:   Supervising Provider    Answer:   Smitty CordsKARAMALEGOS, ALEXANDER J [[id number]]      Follow up plan: Return in about 6 months (around 10/11/2017) for anxiety.    Wilhelmina McardleLauren Ihan Pat, DNP, AGPCNP-BC Adult Gerontology Primary Care Nurse Practitioner [facility] 04/18/2017, 10:54 AM

## 2017-04-18 DIAGNOSIS — G4709 Other insomnia: Secondary | ICD-10-CM | POA: Insufficient documentation

## 2017-04-18 NOTE — Assessment & Plan Note (Addendum)
Insomnia associated w/ anxiety and shift work. See AP anxiety above.  Plan: 1. Encouraged good sleep hygiene practices. 2. Follow up 6 months.

## 2017-04-18 NOTE — Assessment & Plan Note (Signed)
Significant improvement in panic symptoms and anxiety noted by pt with her new job  PHQ9 and GAD 7 scores w/ some improvement, but is not consistent w/ pt reported functioning and improvement.  Plan: 1. Continue buspar 15 mg bid.  Pt refuses adding any adjunctive therapy for anxiety/insomnia. 2. Continue good diet and regular exercise. 3. Continue with faith based support system and family support. 4. Encouraged ongoing work w/ insomnia.  Suggested avoiding screens, using podcast/radio instead of TV for background noise. 5. Follow up 6 months.

## 2017-10-11 ENCOUNTER — Other Ambulatory Visit: Payer: Self-pay

## 2017-10-11 ENCOUNTER — Encounter: Payer: Self-pay | Admitting: Nurse Practitioner

## 2017-10-11 ENCOUNTER — Ambulatory Visit: Payer: BLUE CROSS/BLUE SHIELD | Admitting: Nurse Practitioner

## 2017-10-11 VITALS — BP 120/74 | Temp 98.4°F | Ht 62.0 in | Wt 169.4 lb

## 2017-10-11 DIAGNOSIS — Z23 Encounter for immunization: Secondary | ICD-10-CM

## 2017-10-11 DIAGNOSIS — F419 Anxiety disorder, unspecified: Secondary | ICD-10-CM

## 2017-10-11 DIAGNOSIS — G5603 Carpal tunnel syndrome, bilateral upper limbs: Secondary | ICD-10-CM

## 2017-10-11 MED ORDER — BUSPIRONE HCL 15 MG PO TABS: ORAL_TABLET | ORAL | 1 refills | Status: DC

## 2017-10-11 MED ORDER — PREDNISONE 10 MG PO TABS: ORAL_TABLET | ORAL | 0 refills | Status: DC

## 2017-10-11 NOTE — Progress Notes (Signed)
Subjective:    Patient ID: Cindy QuestMicaela Osten, female    DOB: 1981-05-09, 37 y.o.   MRN: [id number]  Cindy Bonilla is a 37 y.o. female presenting on 10/11/2017 for Anxiety and Numbness (and tingling in both hands during bedtime x 2 mths )   HPI Anxiety Workplace stress is higher, job pressure for moving quickly to perform.  States she is taking buspirone 15 mg twice daily and tolerating well.  She can tell that it is working because she notices higher anxiety when not taking it consistently.  She rarely misses doses because of this.  GAD 7 : Generalized Anxiety Score 10/11/2017 04/12/2017 01/11/2017 11/20/2016  Nervous, Anxious, on Edge 1 1 1 3   Control/stop worrying 2 2 1 3   Worry too much - different things 2 2 1 3   Trouble relaxing 1 1 2  0  Restless 1 1 2 1   Easily annoyed or irritable 2 1 2  0  Afraid - awful might happen 1 1 1 1   Total GAD 7 Score 10 9 10 11   Anxiety Difficulty Somewhat difficult Somewhat difficult Somewhat difficult Not difficult at all    [facility] 2/9 10/11/2017 04/12/2017 01/11/2017 11/20/2016 10/10/2015  Decreased Interest 1 1 0 - 2  Down, Depressed, Hopeless 0 1 0 0 1  PHQ - 2 Score 1 2 0 0 3  Altered sleeping 2 1 2 2 3   Tired, decreased energy 1 2 2 3 3   Change in appetite 1 2 1  0 2  Feeling bad or failure about yourself  0 0 0 0 1  Trouble concentrating 0 1 0 0 2  Moving slowly or fidgety/restless 0 0 0 0 2  Suicidal thoughts 0 0 0 0 1  PHQ-9 Score 5 8 5 5 17   Difficult doing work/chores Somewhat difficult Somewhat difficult - Not difficult at all Somewhat difficult     Numbness in bilateral hands Feels most at medtime,  Also has tingling only in hands and not radiating up to elbows.  Numbness and tingling occur most when patient is sleeping on her side.  It resolves when she turns to her back.  She reports both neck and shoulder pain as well as the tingling in her hands.  She works to [employer], which requires lots of manual labor  movements and significant vibration with mallet and hammer.  Social History   Tobacco Use  . Smoking status: Former Games developermoker  . Smokeless tobacco: Never Used  Substance Use Topics  . Alcohol use: Yes    Alcohol/week: 0.0 oz    Comment: occasional  . Drug use: No    Review of Systems Per HPI unless specifically indicated above     Objective:    BP 120/74 (BP Location: Right Arm, Patient Position: Sitting, Cuff Size: Normal)   Temp 98.4 F (36.9 C) (Oral)   Ht 5\' 2"  (1.575 m)   Wt 169 lb 6.4 oz (76.8 kg)   BMI 30.98 kg/m   Wt Readings from Last 3 Encounters:  10/11/17 169 lb 6.4 oz (76.8 kg)  04/12/17 174 lb 9.6 oz (79.2 kg)  01/11/17 168 lb 12.8 oz (76.6 kg)    Physical Exam  Constitutional: She is oriented to person, place, and time. She appears well-developed and well-nourished. No distress.  HENT:  Head: Normocephalic and atraumatic.  Cardiovascular: Normal rate and regular rhythm.  Pulmonary/Chest: Effort normal and breath sounds normal. No respiratory distress.  Musculoskeletal:  Positive Phalen and Tinel sign bilateral hands. Good  grip strength.  C-Spine Inspection: Normal appearance, no spinal deformity, symmetrical. Palpation: Tenderness over spinous processes from C4-T1. Bilateral cervical paraspinal muscles tender and with mild hypertonicity/spasm.  Bilateral trapezius muscles tender and with hypertonicity/spasm ROM: Full active ROM forward flex / neck extension, rotation L/R without discomfort, Lateral bending L/R without discomfort Special Testing: Spurling's test negative for pain, pincer grasp normal strength  Strength: Bilateral shoulder flex/ext 5/5 Neurovascular: intact distal sensation to light touch  Neurological: She is alert and oriented to person, place, and time.  Skin: Skin is warm and dry.  Psychiatric: She has a normal mood and affect. Her behavior is normal.  Vitals reviewed.   Results for orders placed or performed in visit on 04/18/16    PPD  Result Value Ref Range   TB Skin Test Negative    Induration 0 mm      Assessment & Plan:   Problem List Items Addressed This Visit      Other   Anxiety  -  Primary Mildly worsened, but manageable.  Pt reports increased work stress for productivity.  She is taking BuSpar 15 mg twice daily and tolerating well without side effects.  Plan: 1.  Patient declines SSRI.  Patient's mother did not do well with sertraline and patient has believes that she should not start these medications at this point in time. 2.  Increase buspirone to 20 mg once daily.  Take 1-1/3 15 mg tablet twice daily. 3.  Follow-up 6 months or sooner if needed if BuSpar increase is not controlling symptoms.  Patient verbalizes understanding.   Relevant Medications   busPIRone (BUSPAR) 15 MG tablet    Other Visit Diagnoses    Need for diphtheria-tetanus-pertussis (Tdap) vaccine    Pt needs tetanus vaccine.  > 10 years since last vaccination.  Plan: 1. Reviewed tetanus disease and need for vaccination. 2. Administer vaccine today.   Relevant Orders   Tdap vaccine greater than or equal to 7yo IM (Completed)   Bilateral carpal tunnel syndrome     Clinically most consistent with acute bilateral hand carpal tunnel syndrome. History and exam not suggestive of other nerve impingement or complication - Suspected secondary to chronic repetitive overuse -Adequate conservative therapy to date   Plan: 1. Discussed course of carpal tunnel syndrome, treatment, prognosis, complications 2. START Prednisone taper 7- day with 60 mg for 2 days down to 10mg  down by 10 each day - if improve and need extend can add 3-5 more days if need, review precautions - HOLD NSAID while on prednisone.  - When finished, START NSAID trial with Naproxen 500mg  BID wc 1-2 weeks then PRN may extend up to 2-4 weeks, then PRN - Take Tylenol PRN breakthrough 3. If no improvement with NSAID therapy, START bilateral wrist splint support at bedtime  (avoid flex/ext repetition) -Urged patient to seek Environmental health practitioner evaluation for body mechanics and consideration of lessening vibrations with hands. 4. Follow-up within 4-6 weeks if not improved   Relevant Medications   busPIRone (BUSPAR) 15 MG tablet   predniSONE (DELTASONE) 10 MG tablet      Meds ordered this encounter  Medications  . busPIRone (BUSPAR) 15 MG tablet    Sig: Take 1 and 1/3 tablet (20 mg total) twice daily.    Dispense:  240 tablet    Refill:  1    Order Specific Question:   Supervising Provider    Answer:   Smitty Cords [[id number]]  . predniSONE (DELTASONE) 10 MG tablet  Sig: Day 1-2 take 6 pills. Day 3 take 5 pills then reduce by 1 pill each day.    Dispense:  27 tablet    Refill:  0    Order Specific Question:   Supervising Provider    Answer:   Smitty Cords [[id number]]    Follow up plan: Return in about 6 months (around 04/12/2018) for anxiety.  Cindy Mcardle, DNP, AGPCNP-BC Adult Gerontology Primary Care Nurse Practitioner [facility] 10/11/2017, 1:56 PM

## 2017-10-11 NOTE — Patient Instructions (Addendum)
Charlsie QuestMicaela Gelles,   Thank you for coming in to clinic today.  1. You most likely have Carpal Tunnel Syndrome of Both hands/wrists based on your symptoms. This a problem of compression on the nerve entering the hand at the wrist. Often it is caused by long history of overuse or repetitive activities that put strain on the nerve within wrist. Occasionally this can be caused by swelling or weight gain and pressure on this nerve as well.  Start with Prednisone taper 7 days from 60 down to 10mg  then stop - AFTER  Prednisone Recommend trial of Anti-inflammatory with Naproxen (Naprosyn) 440mg  (2 tabs) - take one with food and plenty of water TWICE daily every day (breakfast and dinner), for next 2 weeks, then you may take only as needed. - It is safe to take Tylenol Ext Str 500mg  tabs - take 1 to 2 (max dose 1000mg ) every 6 hours as needed for breakthrough pain, max 24 hour daily dose is 6 to 8 tablets or 4000mg   - May benefit from splinting wrists at night.  START this every night if medications are not helping to resolve the problem.    INCREASE buspirone to 20 mg.  Take 1 and 1/3 of a 15 mg tablet twice daily.   Please schedule a follow-up appointment with Wilhelmina McardleLauren Raquel Racey, AGNP. Return in about 6 months (around 04/12/2018) for anxiety.  If you have any other questions or concerns, please feel free to call the clinic or send a message through MyChart. You may also schedule an earlier appointment if necessary.  You will receive a survey after today's visit either digitally by e-mail or paper by [employer] mail. Your experiences and feedback matter to us.  Please respond so we know how we are doing as we provide care for you.   Wilhelmina McardleLauren Ronel Rodeheaver, DNP, AGNP-BC Adult Gerontology Nurse Practitioner [facility], [facility]

## 2017-11-05 ENCOUNTER — Ambulatory Visit: Payer: BLUE CROSS/BLUE SHIELD | Admitting: Nurse Practitioner

## 2017-11-05 ENCOUNTER — Other Ambulatory Visit: Payer: Self-pay

## 2017-11-05 ENCOUNTER — Encounter: Payer: Self-pay | Admitting: Nurse Practitioner

## 2017-11-05 VITALS — BP 122/71 | HR 85 | Temp 98.3°F | Ht 62.0 in | Wt 169.2 lb

## 2017-11-05 DIAGNOSIS — S46912A Strain of unspecified muscle, fascia and tendon at shoulder and upper arm level, left arm, initial encounter: Secondary | ICD-10-CM | POA: Diagnosis not present

## 2017-11-05 DIAGNOSIS — X503XXA Overexertion from repetitive movements, initial encounter: Secondary | ICD-10-CM

## 2017-11-05 MED ORDER — NAPROXEN 500 MG PO TABS: 500.0000 mg | ORAL_TABLET | Freq: Two times a day (BID) | ORAL | 0 refills | Status: DC

## 2017-11-05 MED ORDER — BACLOFEN 10 MG PO TABS: 5.0000 mg | ORAL_TABLET | Freq: Three times a day (TID) | ORAL | 0 refills | Status: DC

## 2017-11-05 NOTE — Progress Notes (Signed)
Subjective:    Patient ID: Cindy Bonilla, female    DOB: February 27, 1981, 37 y.o.   MRN: [id number]  Cindy Bonilla is a 37 y.o. female presenting on 11/05/2017 for Chest Pain (Sharp upper left side chest pain, x 1 week. bilateral arm pain, neck and upper back. )   HPI Musculoskeletal Chest Pain Pt presents with left upper chest pain x 1 week.  Sudden onset of pain occurred while at work.  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  Pain first started Tuesday 1 week ago.  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  Otherwise, pt would not be able to perform her job.  Pt has used ice on chest with mild relief.  She is also taking OTC Aleeve, which is not helping.  Currently, care is not considered worker's compensation and pt verbalizes understanding.  Social History   Tobacco Use  . Smoking status: Former Games developer  . Smokeless tobacco: Never Used  Substance Use Topics  . Alcohol use: Yes    Alcohol/week: 0.0 oz    Comment: occasional  . Drug use: No    Review of Systems Per HPI unless specifically indicated above     Objective:    BP 122/71 (BP Location: Right Arm, Patient Position: Sitting, Cuff Size: Normal)   Pulse 85   Temp 98.3 F (36.8 C) (Oral)   Ht  (1.575 m)   Wt 169 lb 3.2 oz (76.7 kg)   BMI 30.95 kg/m   Wt Readings from Last 3 Encounters:  11/05/17 169 lb 3.2 oz (76.7 kg)  10/11/17 169 lb 6.4 oz (76.8 kg)  04/12/17 174 lb 9.6 oz (79.2 kg)    Physical Exam  Constitutional: She is oriented to person, place, and time. She appears well-developed and well-nourished. No distress.  HENT:  Head: Normocephalic and atraumatic.  Cardiovascular: Normal rate, regular rhythm, S1 normal, S2 normal, normal heart sounds and intact distal pulses.  Pulmonary/Chest: Effort normal and breath sounds normal. No respiratory distress.  Musculoskeletal:    Left Shoulder Inspection: Normal appearance bilateral symmetrical Palpation: Very tender to palpation over anterior shoulder, pectoral muscle.  Mildly tender to palpation over lateral and posterior shoulder.  Trapezius is moderately tender to palpation ROM: limited AROM  With forward flexion, abduction, internal / external rotation.  ROM limited by pain. Special Testing: Rotator cuff testing negative for weakness with supraspinatus full can and empty can test, O'brien's negative for labral pain, Hawkin's AC impingement negative for pain Strength: Normal strength 5/5 flex/ext, ext rot / int rot, grip, rotator cuff str testing. Neurovascular: Distally intact pulses, sensation to light touch   Neurological: She is alert and oriented to person, place, and time.  Skin: Skin is warm and dry.  Psychiatric: She has a normal mood and affect. Her behavior is normal.  Vitals reviewed.    Results for orders placed or performed in visit on 04/18/16  PPD  Result Value Ref Range   TB Skin Test Negative    Induration 0 mm      Assessment & Plan:   Problem List Items Addressed This Visit    None    Visit Diagnoses    Repetitive strain injury of left shoulder, initial encounter    -  Primary   Relevant Medications   naproxen (NAPROSYN) 500 MG tablet   baclofen (LIORESAL)  10 MG tablet   Other Relevant Orders   Ambulatory referral to Sports Medicine    Pain likely self-limited.  Muscle strain possible complicated by overuse, repetition at job.  Plan:  1. Treat with OTC pain meds (acetaminophen) and Rx Naproxen 500 mg bid.  Discussed alternate dosing and max dosing. 2. Apply heat and/or ice to affected area. 3. May also apply a muscle rub with lidocaine or lidocaine patch after heat or ice. 4. Take muscle relaxer baclofen 10 mg up to three times daily.  Cautioned drowsiness. 5. Referral sports medicine for evaluation of body mechanics/ injury. 6. Follow up prn.  Consider PT referral also.  Pt  currently declines PT.   Meds ordered this encounter  Medications  . naproxen (NAPROSYN) 500 MG tablet    Sig: Take 1 tablet (500 mg total) by mouth 2 (two) times daily with a meal.    Dispense:  30 tablet    Refill:  0    Order Specific Question:   Supervising Provider    Answer:   Smitty Cords [[id number]]  . baclofen (LIORESAL) 10 MG tablet    Sig: Take 0.5-1 tablets (5-10 mg total) by mouth 3 (three) times daily.    Dispense:  45 each    Refill:  0    Order Specific Question:   Supervising Provider    Answer:   Smitty Cords [[id number]]    Follow up plan: Return in about 1 month (around 12/03/2017), or if symptoms worsen or fail to improve for possible imaging studies.  Wilhelmina Mcardle, DNP, AGPCNP-BC Adult Gerontology Primary Care Nurse Practitioner [facility] 11/05/2017, 10:40 AM

## 2017-11-05 NOTE — Patient Instructions (Addendum)
Charlsie Quest,   Thank you for coming in to clinic today.  1. You have a left anterior shoulder muscle strain. Likely caused by repetitive workplace injury - Start taking Tylenol extra strength 1 to 2 tablets every 6-8 hours for aches or fever/chills for next few days as needed.  Do not take more than 3,000 mg in 24 hours from all medicines.  May take Ibuprofen as well if tolerated 200-400mg  every 8 hours as needed. May alternate tylenol and ibuprofen in same day. - Use heat and ice.  Apply this for 15 minutes at a time 6-8 times per day.   - Muscle rub with lidocaine, lidocaine patch, Biofreeze, or tiger balm for topical pain relief.  Avoid using this with heat and ice to avoid burns. - START muscle relaxer baclofen 10 mg one tablet up to three times daily.  This can cause drowsiness, so use caution.  It may be best to only take this at night for helping you during sleep.   Please schedule a follow-up appointment with Wilhelmina Mcardle, AGNP. Return in about 1 month (around 12/03/2017), or if symptoms worsen or fail to improve for possible imaging studies.  If you have any other questions or concerns, please feel free to call the clinic or send a message through MyChart. You may also schedule an earlier appointment if necessary.  You will receive a survey after today's visit either digitally by e-mail or paper by [employer]. Your experiences and feedback matter to [place].  Please respond so we know how we are doing as we provide care for you.   Wilhelmina Mcardle, DNP, AGNP-BC Adult Gerontology Nurse Practitioner [facility], [facility]

## 2018-01-15 ENCOUNTER — Other Ambulatory Visit: Payer: Self-pay

## 2018-01-15 ENCOUNTER — Encounter: Payer: Self-pay | Admitting: Nurse Practitioner

## 2018-01-15 ENCOUNTER — Ambulatory Visit: Payer: BLUE CROSS/BLUE SHIELD | Admitting: Nurse Practitioner

## 2018-01-15 VITALS — BP 103/68 | HR 74 | Temp 98.4°F | Ht 62.0 in | Wt 161.6 lb

## 2018-01-15 DIAGNOSIS — F419 Anxiety disorder, unspecified: Secondary | ICD-10-CM | POA: Diagnosis not present

## 2018-01-15 DIAGNOSIS — M25512 Pain in left shoulder: Secondary | ICD-10-CM | POA: Diagnosis not present

## 2018-01-15 DIAGNOSIS — Z3040 Encounter for surveillance of contraceptives, unspecified: Secondary | ICD-10-CM | POA: Diagnosis not present

## 2018-01-15 DIAGNOSIS — S46912A Strain of unspecified muscle, fascia and tendon at shoulder and upper arm level, left arm, initial encounter: Secondary | ICD-10-CM | POA: Diagnosis not present

## 2018-01-15 DIAGNOSIS — X503XXA Overexertion from repetitive movements, initial encounter: Secondary | ICD-10-CM

## 2018-01-15 MED ORDER — CYCLOBENZAPRINE HCL 10 MG PO TABS: 10.0000 mg | ORAL_TABLET | Freq: Every evening | ORAL | 2 refills | Status: DC | PRN

## 2018-01-15 MED ORDER — BUSPIRONE HCL 10 MG PO TABS: ORAL_TABLET | ORAL | 6 refills | Status: DC

## 2018-01-15 MED ORDER — NORGESTREL-ETHINYL ESTRADIOL 0.3-30 MG-MCG PO TABS: 1.0000 | ORAL_TABLET | Freq: Every day | ORAL | 12 refills | Status: DC

## 2018-01-15 NOTE — Progress Notes (Signed)
Subjective:    Patient ID: Cindy Bonilla, female    DOB: [birth year], 36 y.o.   MRN: [id number]  Cindy Bonilla is a 37 y.o. female presenting on 01/15/2018 for Neck Pain (intermittent left neck pain that radiates down the left shoulder ) and Shoulder Pain (left shoulder pain )   HPI Neck Pain and Shoulder Pain Is much improved with physical therapy.  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  She notes this more only if she is overworking in relationship to her injury and current level of healing.  Last night, she noted a very sore knot in her posterior shoulder.  She has had no continuing physical therapy with worker's compensation as her claim was recently denied.  She is getting better relief with Flexeril than baclofen and is only taking prn at bedtime.  She also continues to have good relief with diclofenac patch.  Contraception No missed pills, no breakthrough bleeding.  Is happy with current OCP and wishes to continue.  Continues to desire no children at this time.  No current smoking.  No blood clots/DVTs in past.  Anxiety Patient reports she is currently happy with buspar dose.  She is taking 20 mg twice daily.  At this time she does not want to start or change any of her medications.  Stress management strategies are adequate for controlling her current levels of stress and anxiety.  GAD 7 : Generalized Anxiety Score 01/15/2018 10/11/2017 04/12/2017 01/11/2017  Nervous, Anxious, on Edge 1 1 1 1   Control/stop worrying 1 2 2 1   Worry too much - different things 1 2 2 1   Trouble relaxing 2 1 1 2   Restless 2 1 1 2   Easily annoyed or irritable 2 2 1 2   Afraid - awful might happen 2 1 1 1   Total GAD 7 Score 11 10 9 10   Anxiety Difficulty Not difficult at all Somewhat difficult Somewhat difficult Somewhat difficult   Depression screen [facility] 2/9 01/15/2018 10/11/2017 04/12/2017 01/11/2017 11/20/2016  Decreased Interest 0 1 1 0 -   Down, Depressed, Hopeless 0 0 1 0 0  PHQ - 2 Score 0 1 2 0 0  Altered sleeping 0 2 1 2 2   Tired, decreased energy 0 1 2 2 3   Change in appetite 0 1 2 1  0  Feeling bad or failure about yourself  0 0 0 0 0  Trouble concentrating 0 0 1 0 0  Moving slowly or fidgety/restless 0 0 0 0 0  Suicidal thoughts 0 0 0 0 0  PHQ-9 Score 0 5 8 5 5   Difficult doing work/chores Not difficult at all Somewhat difficult Somewhat difficult - Not difficult at all     Social History   Tobacco Use  . Smoking status: Former Games developer  . Smokeless tobacco: Never Used  Substance Use Topics  . Alcohol use: Yes    Alcohol/week: 0.0 oz    Comment: occasional  . Drug use: No    Review of Systems Per HPI unless specifically indicated above     Objective:    BP 103/68 (BP Location: Right Arm, Patient Position: Sitting, Cuff Size: Normal)   Pulse 74   Temp 98.4 F (36.9 C) (Oral)   Ht 5\' 2"  (1.575 m)   Wt 161 lb 9.6 oz (73.3 kg)   BMI 29.56 kg/m   Wt Readings from Last 3 Encounters:  01/15/18 161 lb 9.6 oz (73.3 kg)  11/05/17 169 lb 3.2 oz (76.7 kg)  10/11/17 169 lb 6.4 oz (76.8 kg)    Physical Exam  Constitutional: She is oriented to person, place, and time. She appears well-developed and well-nourished. No distress.  HENT:  Head: Normocephalic and atraumatic.  Neck: Normal range of motion. Neck supple.  Cardiovascular: Normal rate, regular rhythm, S1 normal, S2 normal, normal heart sounds and intact distal pulses.  Pulmonary/Chest: Effort normal and breath sounds normal. No respiratory distress.  Musculoskeletal:       Left shoulder: She exhibits tenderness and spasm (at location #1). She exhibits normal range of motion.       Arms: Neurological: She is alert and oriented to person, place, and time.  Skin: Skin is warm and dry. Capillary refill takes less than 2 seconds.  Psychiatric: She has a normal mood and affect. Her behavior is normal. Judgment and thought content normal.  Vitals  reviewed.   Results for orders placed or performed in visit on 04/18/16  PPD  Result Value Ref Range   TB Skin Test Negative    Induration 0 mm      Assessment & Plan:   Problem List Items Addressed This Visit      Other   Anxiety   Relevant Medications   busPIRone (BUSPAR) 10 MG tablet    Other Visit Diagnoses    Encounter for refill of prescription for contraception    -  Primary   Relevant Medications   norgestrel-ethinyl estradiol (CRYSELLE-28) 0.3-30 MG-MCG tablet   Left anterior shoulder pain       Relevant Medications   cyclobenzaprine (FLEXERIL) 10 MG tablet   Repetitive strain injury of left shoulder, initial encounter       Relevant Medications   cyclobenzaprine (FLEXERIL) 10 MG tablet      Meds ordered this encounter  Medications  . norgestrel-ethinyl estradiol (CRYSELLE-28) 0.3-30 MG-MCG tablet    Sig: Take 1 tablet by mouth daily.    Dispense:  1 Package    Refill:  12  . cyclobenzaprine (FLEXERIL) 10 MG tablet    Sig: Take 1 tablet (10 mg total) by mouth at bedtime as needed for muscle spasms.    Dispense:  30 tablet    Refill:  2    Order Specific Question:   Supervising Provider    Answer:   Smitty Cords [[id number]]  . busPIRone (BUSPAR) 10 MG tablet    Sig: Take 2 tablets (20 mg total) twice daily.    Dispense:  120 tablet    Refill:  6    Order Specific Question:   Supervising Provider    Answer:   Smitty Cords [[id number]]   #1 Anxiety: Stable on buspirone 20 mg twice daily.   Depression is resolved.  Anxiety is still moderate, but patient does not desire any improved control.  Anxiety is not affecting daily life. - Continue buspirone 20 mg bid.  Change to two 10 mg tablets twice daily.  Refill sent.  - Continue stress management strategies. - Followup 6 months  #2 Left shoulder pain (anterior and posterior) 2/2 repetitive workplace injury Improving.  Patient now has very little anterior shoulder/pectoral pain.  She is having  ongoing posterior shoulder pain.   - Recommend continuing physical therapy.  Referral to be sent if patient desires in future. - Continue flexeril 10 mg at bedtime prn spasm - Treat with OTC pain meds (acetaminophen and ibuprofen).  Discussed alternate dosing and max dosing. - Apply heat and/or ice  to affected area. - May also apply a muscle rub with lidocaine or lidocaine patch after heat or ice. 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  Continuous accommodation for patient's height with platforms and racks modified to prevent future injury. - Followup as needed.  #3 Contraception: Pt desires no future children  and wants to discuss options today.  No concern that pt is currently pregnant.  Patient has been on OCP with no missed doses    Plan: 1. Patient prefers to continue current OCP.  Refill provided 2. Educated on common side effects associated w/ all estrogen hormone use. 3. Followup as needed and in 1 year.    Follow up plan: Return in about 3 months (around 04/11/2018) for annual physical.  Wilhelmina McardleLauren Burt Piatek, DNP, AGPCNP-BC Adult Gerontology Primary Care Nurse Practitioner [facility] 01/15/2018, 9:53 AM

## 2018-01-15 NOTE — Patient Instructions (Addendum)
Charlsie QuestMicaela Nabi,   Thank you for coming in to clinic today.  1. Continue muscle relaxer flexeril 10 mg once daily at bedtime as needed for muscle spasm. 2. Continue birth control pill. 3. Call if you want to continue physical therapy.  Please schedule a follow-up appointment with Wilhelmina McardleLauren Azhia Siefken, AGNP. Return in about 3 months (around 04/11/2018) for annual physical.  If you have any other questions or concerns, please feel free to call the clinic or send a message through MyChart. You may also schedule an earlier appointment if necessary.  You will receive a survey after today's visit either digitally by e-mail or paper by [employer] mail. Your experiences and feedback matter to us.  Please respond so we know how we are doing as we provide care for you.   Wilhelmina McardleLauren Cady Hafen, DNP, AGNP-BC Adult Gerontology Nurse Practitioner [facility], [facility]

## 2018-02-10 ENCOUNTER — Telehealth: Payer: Self-pay

## 2018-02-10 NOTE — Telephone Encounter (Signed)
The husband call with some concerns about his wife anxiety level. He wants to keep this call confidential, but would like for us to reach out to the patient. He requesting to speak with her doctor, but agreed inform me of his concerns so I could create a message for the provider.  He states since his wife been on her anxiety medication he's notice some changes in her behavioral . She stopped socializing with him and became very withdrawn. He said this is not her normal behavior. Normally they sit down and communicate, but things have changed.  Now his wife has  left the home and stop answering his calls.  He admits that they have some financial issues, but nothing really going own that would cause her to leave. He also states that she have had episodes in the past were she gets so down and out, but normally he can help pull her out of it. He said one of his wife fears is that she will develop mental issue like her mother. He is very concern because she's not answering his call. He's requesting that we attempt to reach out to her. Sharma Covertorman (husband) [phone]

## 2018-02-10 NOTE — Telephone Encounter (Signed)
We can listen to Sharma Covertorman, but it will be very difficult/impossible to call Jalaysha and keep his phone call confidential.  You may return his call tomorrow and continue encouraging him to make contact with her.  If Rigby desires, she can make an appointment with us to discuss anxiety treatment.

## 2018-02-11 ENCOUNTER — Telehealth: Payer: Self-pay

## 2018-02-11 NOTE — Telephone Encounter (Signed)
I notified Sharma Covertorman that we cannot address her issues with the patient consent. I expressed I understood his concerns, but the pt would have to reach out to us in order to help her. I encouraged him to continue trying to reach out to her and encourage her to make a f/u appt. He verbalize understand, no questions or concerns.

## 2018-02-11 NOTE — Telephone Encounter (Signed)
Open in error

## 2018-04-11 ENCOUNTER — Ambulatory Visit: Payer: BLUE CROSS/BLUE SHIELD | Admitting: Nurse Practitioner

## 2018-04-11 ENCOUNTER — Encounter: Payer: BLUE CROSS/BLUE SHIELD | Admitting: Nurse Practitioner

## 2018-04-22 ENCOUNTER — Encounter: Payer: Self-pay | Admitting: Nurse Practitioner

## 2018-04-22 ENCOUNTER — Ambulatory Visit (INDEPENDENT_AMBULATORY_CARE_PROVIDER_SITE_OTHER): Payer: BLUE CROSS/BLUE SHIELD | Admitting: Nurse Practitioner

## 2018-04-22 ENCOUNTER — Other Ambulatory Visit: Payer: Self-pay

## 2018-04-22 VITALS — BP 102/56 | HR 70 | Temp 98.4°F | Ht 62.0 in | Wt 160.2 lb

## 2018-04-22 DIAGNOSIS — Z0000 Encounter for general adult medical examination without abnormal findings: Secondary | ICD-10-CM | POA: Diagnosis not present

## 2018-04-22 DIAGNOSIS — Z131 Encounter for screening for diabetes mellitus: Secondary | ICD-10-CM

## 2018-04-22 NOTE — Progress Notes (Signed)
Subjective:    Patient ID: Cindy Bonilla, female    DOB: [birth year], 37 y.o.   MRN: [id number]  Cindy Bonilla is a 37 y.o. female presenting on 04/22/2018 for Annual Exam   HPI Annual Physical Exam Patient has been feeling well.  They have no acute concerns today. Sleeps 6-7 hours per night frequently interrupted, stays awake for a while and not always getting back to sleep. (Works 1-9pm) Sleeps 2:30-3am.    HEALTH MAINTENANCE: Weight/BMI: steadily decreasing Physical activity: regular with work, not regular outside work except chores Diet: trying to eat healthier: reduced/normal portions, reduced red meats, sweets, not lots of snacking (when she does, is healthier snack like fruit/vegetable) Seatbelt: always Sunscreen: occasionally and in face products  PAP: defers - currently on menstrual cycle HIV: negative 2000 Optometry: not regularly Dentistry: regularly  VACCINES: Tetanus: 2019 Influenza: declines  Past Medical History:  Diagnosis Date  . Arthritis   . Depression   . Urinary urgency    Past Surgical History:  Procedure Laterality Date  . none     Social History   Socioeconomic History  . Marital status: Single    Spouse name: Not on file  . Number of children: Not on file  . Years of education: Not on file  . Highest education level: Not on file  Occupational History  . Not on file  Social Needs  . Financial resource strain: Not on file  . Food insecurity:    Worry: Not on file    Inability: Not on file  . Transportation needs:    Medical: Not on file    Non-medical: Not on file  Tobacco Use  . Smoking status: Former Games developer  . Smokeless tobacco: Never Used  Substance and Sexual Activity  . Alcohol use: Yes    Alcohol/week: 0.0 standard drinks    Comment: occasional  . Drug use: No  . Sexual activity: Not on file  Lifestyle  . Physical activity:    Days per week: Not on file    Minutes per session: Not on file  . Stress: Not on file    Relationships  . Social connections:    Talks on phone: Not on file    Gets together: Not on file    Attends religious service: Not on file    Active member of club or organization: Not on file    Attends meetings of clubs or organizations: Not on file    Relationship status: Not on file  . Intimate partner violence:    Fear of current or ex partner: Not on file    Emotionally abused: Not on file    Physically abused: Not on file    Forced sexual activity: Not on file  Other Topics Concern  . Not on file  Social History Narrative  . Not on file   Family History  Problem Relation Age of Onset  . Diabetes Mother   . Depression Mother   . Aneurysm Brother   . Bladder Cancer Neg Hx   . Prostate cancer Neg Hx   . Kidney cancer Neg Hx    Current Outpatient Medications on File Prior to Visit  Medication Sig  . busPIRone (BUSPAR) 10 MG tablet Take 2 tablets (20 mg total) twice daily.  . cyclobenzaprine (FLEXERIL) 10 MG tablet Take 1 tablet (10 mg total) by mouth at bedtime as needed for muscle spasms.  . diclofenac (FLECTOR) 1.3 % PTCH Flector 1.3 % transdermal 12 hour patch  APPLY 1  PATCH TOPICALLY TWICE DAILY AS NEEDED  . meclizine (ANTIVERT) 25 MG tablet Take 25 mg by mouth 3 (three) times daily as needed for dizziness.  . norgestrel-ethinyl estradiol (CRYSELLE-28) 0.3-30 MG-MCG tablet Take 1 tablet by mouth daily.   No current facility-administered medications on file prior to visit.     Review of Systems Per HPI unless specifically indicated above    Objective:    BP (!) 102/56   Pulse 70   Temp 98.4 F (36.9 C) (Oral)   Ht 5\' 2"  (1.575 m)   Wt 160 lb 3.2 oz (72.7 kg)   LMP 04/21/2018   BMI 29.30 kg/m   Wt Readings from Last 3 Encounters:  04/22/18 160 lb 3.2 oz (72.7 kg)  01/15/18 161 lb 9.6 oz (73.3 kg)  11/05/17 169 lb 3.2 oz (76.7 kg)    Physical Exam  Constitutional: She is oriented to person, place, and time. She appears well-developed and  well-nourished. No distress.  HENT:  Head: Normocephalic and atraumatic.  Right Ear: External ear normal.  Left Ear: External ear normal.  Nose: Nose normal.  Mouth/Throat: Oropharynx is clear and moist.  Eyes: Pupils are equal, round, and reactive to light. Conjunctivae are normal.  Neck: Normal range of motion. Neck supple. No JVD present. No tracheal deviation present. No thyromegaly present.  Cardiovascular: Normal rate, regular rhythm, normal heart sounds and intact distal pulses. Exam reveals no gallop and no friction rub.  No murmur heard. Pulmonary/Chest: Effort normal and breath sounds normal. No respiratory distress.  Breast - Normal exam w/ symmetric breasts, no mass, no nipple discharge, no skin changes or tenderness.   Abdominal: Soft. Bowel sounds are normal. She exhibits no distension. There is no hepatosplenomegaly. There is no tenderness.  Musculoskeletal: Normal range of motion.  Lymphadenopathy:    She has no cervical adenopathy.  Neurological: She is alert and oriented to person, place, and time. No cranial nerve deficit.  Skin: Skin is warm and dry. Capillary refill takes less than 2 seconds.  Psychiatric: She has a normal mood and affect. Her behavior is normal. Judgment and thought content normal.  Nursing note and vitals reviewed.    Results for orders placed or performed in visit on 04/18/16  PPD  Result Value Ref Range   TB Skin Test Negative    Induration 0 mm      Assessment & Plan:   Problem List Items Addressed This Visit    None    Visit Diagnoses    Encounter for annual physical exam    -  Primary   Relevant Orders   Lipid panel   TSH   COMPLETE METABOLIC PANEL WITH GFR   CBC with Differential/Platelet   Hemoglobin A1c   Diabetes mellitus screening       Relevant Orders   Hemoglobin A1c    Physical exam with new findings of insomnia, complicated by shift work sleeping.  Well adult with no acute concerns.  Plan: 1. Obtain health  maintenance screenings as above according to age. - Increase physical activity to 30 minutes most days of the week.  - Eat healthy diet high in vegetables and fruits; low in refined carbohydrates. - Encouraged adequate sleep, proper sleep hygiene techniques, sleep routines 2. Return 1 year for annual physical.    Follow up plan: Return in about 10 weeks (around 07/01/2018) for anxiety AND pap smear.  Wilhelmina Mcardle, DNP, AGPCNP-BC Adult Gerontology Primary Care Nurse Practitioner [facility]  04/22/2018, 9:39 AM

## 2018-04-22 NOTE — Patient Instructions (Signed)
Cindy Bonilla,   Thank you for coming in to clinic today.  1. Increase your physical activity until you are increasing your heart rate for 30 minutes on most days of the week. - Continue your work toward healthy eating.  Sleep Hygiene Tips  Take medicines only as directed by your health care provider.  Keep regular sleeping and waking hours. Avoid naps.  Keep a sleep diary to help you and your health care provider figure out what could be causing your insomnia. Include:  When you sleep.  When you wake up during the night.  How well you sleep.  How rested you feel the next day.  Any side effects of medicines you are taking.  What you eat and drink.  Make your bedroom a comfortable place where it is easy to fall asleep:  Put up shades or special blackout curtains to block light from outside. **This one is really important!**  Use a white noise machine to block noise.  Keep the temperature cool.  Exercise regularly as directed by your health care provider. Avoid exercising right before bedtime.  Use relaxation techniques to manage stress. Ask your health care provider to suggest some techniques that may work well for you. These may include:  Breathing exercises.  Routines to release muscle tension.  Visualizing peaceful scenes.  Cut back on alcohol, caffeinated beverages, and cigarettes, especially close to bedtime. These can disrupt your sleep.  Do not overeat or eat spicy foods right before bedtime. This can lead to digestive discomfort that can make it hard for you to sleep.  Limit screen use before bedtime. This includes:  Watching TV.  Using your smartphone, tablet, and computer.  Stick to a routine. This can help you fall asleep faster. Try to do a quiet activity, brush your teeth, and go to bed at the same time each night.  Get out of bed if you are still awake after 15 minutes of trying to sleep. Keep the lights down, but try reading or doing a quiet  activity. When you feel sleepy, go back to bed.  Make sure that you drive carefully. Avoid driving if you feel very sleepy.  Keep all follow-up appointments as directed by your health care provider. This is important.   Please schedule a follow-up appointment with Wilhelmina Mcardle, AGNP. Return in about 10 weeks (around 07/01/2018) for anxiety AND pap smear.  If you have any other questions or concerns, please feel free to call the clinic or send a message through MyChart. You may also schedule an earlier appointment if necessary.  You will receive a survey after today's visit either digitally by e-mail or paper by [employer]. Your experiences and feedback matter to [place].  Please respond so we know how we are doing as we provide care for you.   Wilhelmina Mcardle, DNP, AGNP-BC Adult Gerontology Nurse Practitioner [facility], [facility]

## 2018-08-13 ENCOUNTER — Other Ambulatory Visit: Payer: Self-pay

## 2018-08-13 DIAGNOSIS — F419 Anxiety disorder, unspecified: Secondary | ICD-10-CM

## 2018-08-13 MED ORDER — BUSPIRONE HCL 10 MG PO TABS: ORAL_TABLET | ORAL | 1 refills | Status: DC

## 2018-09-02 ENCOUNTER — Other Ambulatory Visit: Payer: Self-pay | Admitting: Nurse Practitioner

## 2018-09-02 DIAGNOSIS — F419 Anxiety disorder, unspecified: Secondary | ICD-10-CM

## 2018-09-04 NOTE — Telephone Encounter (Signed)
Pharmacy requesting refills. Thanks!  

## 2018-11-05 ENCOUNTER — Other Ambulatory Visit: Payer: Self-pay | Admitting: Nurse Practitioner

## 2018-11-05 DIAGNOSIS — F419 Anxiety disorder, unspecified: Secondary | ICD-10-CM

## 2018-11-05 NOTE — Telephone Encounter (Signed)
Pt called requesting refill on buspar 10 mg called into  [facility] [place]

## 2018-11-05 NOTE — Telephone Encounter (Signed)
I attempted to contact the pt to notify her that she needs an appt.

## 2018-11-06 NOTE — Telephone Encounter (Signed)
The pt was notified. 

## 2018-11-07 ENCOUNTER — Other Ambulatory Visit: Payer: Self-pay

## 2018-11-07 ENCOUNTER — Ambulatory Visit (INDEPENDENT_AMBULATORY_CARE_PROVIDER_SITE_OTHER): Payer: 59 | Admitting: Family Medicine

## 2018-11-07 ENCOUNTER — Encounter: Payer: Self-pay | Admitting: Family Medicine

## 2018-11-07 DIAGNOSIS — H8113 Benign paroxysmal vertigo, bilateral: Secondary | ICD-10-CM | POA: Diagnosis not present

## 2018-11-07 DIAGNOSIS — Z3040 Encounter for surveillance of contraceptives, unspecified: Secondary | ICD-10-CM

## 2018-11-07 DIAGNOSIS — F419 Anxiety disorder, unspecified: Secondary | ICD-10-CM | POA: Diagnosis not present

## 2018-11-07 MED ORDER — BUSPIRONE HCL 10 MG PO TABS: 20.0000 mg | ORAL_TABLET | Freq: Two times a day (BID) | ORAL | 1 refills | Status: DC

## 2018-11-07 MED ORDER — NORGESTREL-ETHINYL ESTRADIOL 0.3-30 MG-MCG PO TABS: 1.0000 | ORAL_TABLET | Freq: Every day | ORAL | 1 refills | Status: DC

## 2018-11-07 MED ORDER — MECLIZINE HCL 25 MG PO TABS: 25.0000 mg | ORAL_TABLET | Freq: Three times a day (TID) | ORAL | 2 refills | Status: DC | PRN

## 2018-11-07 NOTE — Patient Instructions (Addendum)
AVS info given verbally. No mychart access

## 2018-11-07 NOTE — Progress Notes (Signed)
Subjective:    Patient ID: Cindy Bonilla, female    DOB: 1980-07-15, 38 y.o.   MRN: [id number]  Cindy Bonilla is a 38 y.o. female presenting on 11/07/2018 for Anxiety  Virtual / Telehealth Encounter - Video Visit via [email or web address] The purpose of this virtual visit is to provide medical care while limiting exposure to the novel coronavirus (COVID19) for both patient and office staff.  Consent was obtained for remote visit:  Yes.   Answered questions that patient had about telehealth interaction:  Yes.   I discussed the limitations, risks, security and privacy concerns of performing an evaluation and management service by video/telephone. I also discussed with the patient that there may be a patient responsible charge related to this service. The patient expressed understanding and agreed to proceed.  Patient Location: Home Provider Location: [facility] (Office)  PCP is Wilhelmina Mcardle, AGPCNP-BC - I am currently covering during her maternity leave.   HPI   Genearlized Anxiety Disorder Reports known history of anxiety for years, has been managed on Buspar with good results, currently taking Buspar 10mg  x 2  For 20mg  BID - doing well on this dose, took last pills today, needs refill. - Denies any acute panic attacks, chest pain dyspnea, depression worsening  Vertigo, History of Near Syncope, dizzy spells Known problem in past with episodes of dizziness and vertigo and some near syncope, she has been advised to be more cautious with her quick movements to avoid provoking vertigo symptoms. Has done well, but often for work she cannot always do this, she takes a Meclizine 25mg  PRN rarely with good results, does not make her drowsy or groggy. Request refill.  Contraception - Currently doing well on Cryselle, request refill, 90 day supply. Denies abnormal bleeding or menstrual symptoms  Health Maintenance - Due for annual physical, missed last time due to scheduling w/ work. She  will return for this.   Depression screen [facility] 2/9 11/07/2018 04/22/2018 01/15/2018  Decreased Interest 0 0 0  Down, Depressed, Hopeless 2 0 0  PHQ - 2 Score 2 0 0  Altered sleeping 2 0 0  Tired, decreased energy 2 0 0  Change in appetite 0 0 0  Feeling bad or failure about yourself  0 0 0  Trouble concentrating 0 0 0  Moving slowly or fidgety/restless 0 0 0  Suicidal thoughts 0 0 0  PHQ-9 Score 6 0 0  Difficult doing work/chores Somewhat difficult Not difficult at all Not difficult at all   GAD 7 : Generalized Anxiety Score 11/07/2018 04/22/2018 01/15/2018 10/11/2017  Nervous, Anxious, on Edge 1 1 1 1   Control/stop worrying 1 1 1 2   Worry too much - different things 1 1 1 2   Trouble relaxing 1 1 2 1   Restless 0 0 2 1  Easily annoyed or irritable 1 1 2 2   Afraid - awful might happen 1 0 2 1  Total GAD 7 Score 6 5 11 10   Anxiety Difficulty Somewhat difficult Somewhat difficult Not difficult at all Somewhat difficult     Social History   Tobacco Use  . Smoking status: Former Games developer  . Smokeless tobacco: Never Used  Substance Use Topics  . Alcohol use: Yes    Alcohol/week: 0.0 standard drinks    Comment: occasional  . Drug use: No    Review of Systems Per HPI unless specifically indicated above     Objective:    There were no vitals taken for this visit.  Wt Readings from Last 3 Encounters:  04/22/18 160 lb 3.2 oz (72.7 kg)  01/15/18 161 lb 9.6 oz (73.3 kg)  11/05/17 169 lb 3.2 oz (76.7 kg)    Physical Exam   Note examination was completely remotely via video observation objective data only  Gen - well-appearing, no acute distress or apparent pain, comfortable HEENT - eyes appear clear without discharge or redness Heart/Lungs - cannot examine virtually - observed no evidence of coughing or labored breathing. Skin - face visible today- no rash Neuro - awake, alert, oriented Psych - not anxious appearing      Assessment & Plan:   Problem List Items Addressed  This Visit    Anxiety - Primary  Clinically stable, and controlled GAD Reviewed PHQ GAD scores On Buspar 20 BID - -refill today    Relevant Medications   busPIRone (BUSPAR) 10 MG tablet    Other Visit Diagnoses    Benign paroxysmal positional vertigo due to bilateral vestibular disorder     Chronic episodic vertigo / dizzy, previous diagnosis Stable on lifestyle management and PRN meclizine - Re order meclizine for PRN use    Relevant Medications   meclizine (ANTIVERT) 25 MG tablet   Encounter for refill of prescription for contraception       Relevant Medications   norgestrel-ethinyl estradiol (CRYSELLE-28) 0.3-30 MG-MCG tablet      Meds ordered this encounter  Medications  . busPIRone (BUSPAR) 10 MG tablet    Sig: Take 2 tablets (20 mg total) by mouth 2 (two) times daily.    Dispense:  360 tablet    Refill:  1  . meclizine (ANTIVERT) 25 MG tablet    Sig: Take 1 tablet (25 mg total) by mouth 3 (three) times daily as needed for dizziness.    Dispense:  30 tablet    Refill:  2  . norgestrel-ethinyl estradiol (CRYSELLE-28) 0.3-30 MG-MCG tablet    Sig: Take 1 tablet by mouth daily.    Dispense:  3 Package    Refill:  1    Follow-up: - Return in 5 months for Annual Physical  Patient verbalizes understanding with the above medical recommendations including the limitation of remote medical advice.  Specific follow-up and call-back criteria were given for patient to follow-up or seek medical care more urgently if needed.  Total duration of direct patient care provided via video conference: 15 minutes   Saralyn PilarAlexander Sharonlee Nine, DO [facility] 11/07/2018, 9:48 AM

## 2019-04-08 ENCOUNTER — Other Ambulatory Visit: Payer: Self-pay

## 2019-04-08 DIAGNOSIS — Z20822 Contact with and (suspected) exposure to covid-19: Secondary | ICD-10-CM

## 2019-04-09 LAB — NOVEL CORONAVIRUS, NAA: SARS-CoV-2, NAA: NOT DETECTED

## 2019-04-20 ENCOUNTER — Other Ambulatory Visit: Payer: Self-pay | Admitting: Nurse Practitioner

## 2019-04-20 DIAGNOSIS — F419 Anxiety disorder, unspecified: Secondary | ICD-10-CM

## 2019-06-10 ENCOUNTER — Other Ambulatory Visit: Payer: Self-pay | Admitting: Family Medicine

## 2019-06-10 DIAGNOSIS — F419 Anxiety disorder, unspecified: Secondary | ICD-10-CM

## 2019-08-07 ENCOUNTER — Ambulatory Visit (INDEPENDENT_AMBULATORY_CARE_PROVIDER_SITE_OTHER): Payer: 59 | Admitting: Family Medicine

## 2019-08-07 ENCOUNTER — Other Ambulatory Visit: Payer: Self-pay

## 2019-08-07 ENCOUNTER — Ambulatory Visit: Payer: 59 | Admitting: Family Medicine

## 2019-08-07 ENCOUNTER — Encounter: Payer: Self-pay | Admitting: Family Medicine

## 2019-08-07 DIAGNOSIS — M778 Other enthesopathies, not elsewhere classified: Secondary | ICD-10-CM

## 2019-08-07 MED ORDER — NAPROXEN 500 MG PO TABS: 500.0000 mg | ORAL_TABLET | Freq: Two times a day (BID) | ORAL | 1 refills | Status: DC

## 2019-08-07 NOTE — Progress Notes (Signed)
Virtual Visit via Telephone The purpose of this virtual visit is to provide medical care while limiting exposure to the novel coronavirus (COVID19) for both patient and office staff.  Consent was obtained for phone visit:  Yes.   Answered questions that patient had about telehealth interaction:  Yes.   I discussed the limitations, risks, security and privacy concerns of performing an evaluation and management service by telephone. I also discussed with the patient that there may be a patient responsible charge related to this service. The patient expressed understanding and agreed to proceed.  Patient Location: Home Provider Location: [facility])  ---------------------------------------------------------------------- Chief Complaint  Patient presents with  . Joint Swelling    dizzy, feeling hot flashes onset month she also has HX of vertigo    S: Reviewed CMA documentation. I have called patient and gathered additional HPI as follows:  Left Elbow Pain Reports that symptoms started few weeks, has not had prior injury to Left elbow with tender, sharp pains and aching in L elbow. If she wears brace it helps it but then worse if she takes it off. She is concerned about tendonitis given her repetitive nature of her job.  - Not tried OTC NSAID, Tylenol, topical medicine, ice or heat  History of Vertigo Prior history vertigo, seems to be chronic issue related to hot flashes, taking meclizine PRN. Also asking about due for glucose check, last lab 2017 in our system.  Denies any high risk travel to areas of current concern for COVID19. Denies any known or suspected exposure to person with or possibly with COVID19.  Denies any fevers, chills, sweats, body ache, cough, shortness of breath, sinus pain or pressure, headache, abdominal pain, diarrhea  Past Medical History:  Diagnosis Date  . Arthritis   . Depression   . Urinary urgency    Social History   Tobacco  Use  . Smoking status: Former Games developer  . Smokeless tobacco: Never Used  Substance Use Topics  . Alcohol use: Yes    Alcohol/week: 0.0 standard drinks    Comment: occasional  . Drug use: No    Current Outpatient Medications:  .  busPIRone (BUSPAR) 10 MG tablet, Take 2 tablets (20 mg total) by mouth 2 (two) times daily., Disp: 120 tablet, Rfl: 2 .  meclizine (ANTIVERT) 25 MG tablet, Take 1 tablet (25 mg total) by mouth 3 (three) times daily as needed for dizziness., Disp: 30 tablet, Rfl: 2 .  norgestrel-ethinyl estradiol (CRYSELLE-28) 0.3-30 MG-MCG tablet, Take 1 tablet by mouth daily., Disp: 3 Package, Rfl: 1 .  naproxen (NAPROSYN) 500 MG tablet, Take 1 tablet (500 mg total) by mouth 2 (two) times daily with a meal. For 2-4 weeks then as needed, Disp: 60 tablet, Rfl: 1  Depression screen [facility] 2/9 08/07/2019 11/07/2018 04/22/2018  Decreased Interest 0 0 0  Down, Depressed, Hopeless 0 2 0  PHQ - 2 Score 0 2 0  Altered sleeping - 2 0  Tired, decreased energy - 2 0  Change in appetite - 0 0  Feeling bad or failure about yourself  - 0 0  Trouble concentrating - 0 0  Moving slowly or fidgety/restless - 0 0  Suicidal thoughts - 0 0  PHQ-9 Score - 6 0  Difficult doing work/chores - Somewhat difficult Not difficult at all    GAD 7 : Generalized Anxiety Score 08/07/2019 11/07/2018 04/22/2018 01/15/2018  Nervous, Anxious, on Edge 1 1 1 1   Control/stop worrying 2 1 1  1  Worry too much - different things 2 1 1 1   Trouble relaxing 1 1 1 2   Restless 1 0 0 2  Easily annoyed or irritable 1 1 1 2   Afraid - awful might happen 1 1 0 2  Total GAD 7 Score 9 6 5 11   Anxiety Difficulty Not difficult at all Somewhat difficult Somewhat difficult Not difficult at all    -------------------------------------------------------------------------- O: No physical exam performed due to remote telephone encounter.  Lab results reviewed.  No results found for this or any previous visit (from the past 2160  hour(s)).  -------------------------------------------------------------------------- A&P:  Problem List Items Addressed This Visit    None    Visit Diagnoses    Tendonitis of elbow, left    -  Primary   Relevant Medications   naproxen (NAPROSYN) 500 MG tablet     Clinically L elbow tendonitis/bursitis vs lateral epicondylitis difficult to localize virtually on phone today - Likely acute on chronic flare due to repetitive daily activity at work - Unlikely to be acute muscle tear or fracture without injury - Last imaging - none  Plan - Reviewed precautions with diagnosis and goal to allow it to heal / modify activity - Start Naproxen NSAID 500mg  BID wc x 2-4 weeks, then PRN - Take Tylenol up to 1g TID PRN breakthrough - Recommend RICE therapy, emphasis on Forearm Strap or Elbow Brace to help support the muscle limit repetitive strain, may use ACE wrap for compression - Future can offer formal PT vs Ortho referral / X-ray if need   Meds ordered this encounter  Medications  . naproxen (NAPROSYN) 500 MG tablet    Sig: Take 1 tablet (500 mg total) by mouth 2 (two) times daily with a meal. For 2-4 weeks then as needed    Dispense:  60 tablet    Refill:  1    Follow-up: - Return in 4 weeks as needed tendonitis elbow if not improved, otherwise follow-up 4 weeks for labs, dizzy, hot flashes  Patient verbalizes understanding with the above medical recommendations including the limitation of remote medical advice.  Specific follow-up and call-back criteria were given for patient to follow-up or seek medical care more urgently if needed.   - Time spent in direct consultation with patient on phone: 12 minutes  Nobie Putnam, [facility] 08/07/2019, 3:55 PM

## 2019-08-07 NOTE — Patient Instructions (Addendum)
You most likely have "Tennis Elbow" or "Lateral Epicondylitis" - This is inflammation or tendonitis affecting the muscles of your forearm - Usually it is caused by frequent or daily repetitive activities using your arms, lifting, rotating, pulling, twisting - it can happen over days to months or longer from repetitive strain  One of the most important parts of treatment is rest and avoiding the activities that make it worse.  Recommend trial of Anti-inflammatory with Naproxen (Naprosyn) 500mg  tabs - take one with food and plenty of water TWICE daily every day (breakfast and dinner), for next 2 to 4 weeks, then you may take only as needed - DO NOT TAKE any ibuprofen, aleve, motrin while you are taking this medicine - It is safe to take Tylenol Ext Str 500mg  tabs - take 1 to 2 (max dose 1000mg ) every 6 hours as needed for breakthrough pain, max 24 hour daily dose is 6 to 8 tablets or 4000mg   Use RICE therapy: - R - Rest / relative rest with activity modification avoid overuse of joint - I - Ice packs (make sure you use a towel or sock / something to protect skin) - C - Compression with ACE wrap to apply pressure and reduce swelling allowing more support  Try a Forearm Tennis Elbow Strap over muscle as demonstrated - use with repetitive activity to reduce strain of muscle    - E - Elevation - if significant swelling, lift leg above heart level (toes above your nose) to help reduce swelling, most helpful at night after day of being on your feet  May consider Physical Therapy referral if interested    Please schedule a Follow-up Appointment to: Return in about 4 weeks (around 09/04/2019), or if symptoms worsen or fail to improve, for tendonitis elbow if not improved, otherwise follow-up 4 weeks for labs, dizzy, hot flashes.  If you have any other questions or concerns, please feel free to call the office or send a message through MyChart. You may also schedule an earlier appointment if  necessary.  Additionally, you may be receiving a survey about your experience at our office within a few days to 1 week by e-mail or mail. We value your feedback.  , DO [facility], [facility]              See other page with pictures of each exercise.  Start with 1 or 2 of these exercises that you are most comfortable with. Do not do any exercises that cause you significant worsening pain. Some of these may cause some "stretching soreness" but it should go away after you stop the exercise, and get better over time. Gradually increase up to 3-4 exercises as tolerated.  You may do the stretching exercises right away. You may do the strengthening exercises when stretching is nearly painless.  Stretching exercises Wrist range of motion: Bend your wrist forward and backward as far as you can. Do 3 sets of 10. Pronation and supination of the forearm: With your elbow bent 90, turn your palm upward and hold for 5 seconds. Slowly turn your palm downward and hold for 5 seconds. Make sure you keep your elbow at your side and bent 90 throughout this exercise. Do 3 sets of 10. Elbow range of motion: Gently bring your palm up toward your shoulder and bend your elbow as far as you can. Then straighten your elbow as far as you can 10 times. Do 3  sets of 10.  Strengthening exercises  Wrist flexion exercise: Hold a can or hammer handle in your hand with your palm facing up. Bend your wrist upward. Slowly lower the weight and return to the starting position. Do 3 sets of 10. Gradually increase the weight of the can or weight you are holding. Wrist extension exercise: Hold a soup can or hammer handle in your hand with your palm facing down. Slowly bend your wrist upward. Slowly lower the weight down into the starting position. Do 3 sets of 10. Gradually increase the weight of the object you are holding. Wrist radial deviation strengthening: Put your wrist in the  sideways position with your thumb up. Hold a can of soup or a hammer handle and gently bend your wrist up, with the thumb reaching toward the ceiling. Slowly lower to the starting position. Do not move your forearm throughout this exercise. Do 3 sets of 10. Forearm pronation and supination strengthening: Hold a soup can or hammer handle in your hand and bend your elbow 90. Slowly rotate your hand with your palm upward and then palm down. Do 3 sets of 10. Wrist extension (with broom handle): Stand up and hold a broom handle in both hands. With your arms at shoulder level, elbows straight and palms down, roll the broom handle backward in your hand as if you are reeling something in using a broom handle. Do 3 sets of 10.

## 2019-09-01 ENCOUNTER — Ambulatory Visit: Payer: 59 | Admitting: Family Medicine

## 2019-09-04 ENCOUNTER — Ambulatory Visit (INDEPENDENT_AMBULATORY_CARE_PROVIDER_SITE_OTHER): Payer: 59 | Admitting: Family Medicine

## 2019-09-04 ENCOUNTER — Encounter: Payer: Self-pay | Admitting: Family Medicine

## 2019-09-04 ENCOUNTER — Other Ambulatory Visit: Payer: Self-pay

## 2019-09-04 VITALS — BP 116/83 | HR 73 | Temp 97.5°F | Ht 62.0 in | Wt 179.4 lb

## 2019-09-04 DIAGNOSIS — F419 Anxiety disorder, unspecified: Secondary | ICD-10-CM

## 2019-09-04 DIAGNOSIS — R319 Hematuria, unspecified: Secondary | ICD-10-CM

## 2019-09-04 DIAGNOSIS — R42 Dizziness and giddiness: Secondary | ICD-10-CM

## 2019-09-04 DIAGNOSIS — J0100 Acute maxillary sinusitis, unspecified: Secondary | ICD-10-CM

## 2019-09-04 DIAGNOSIS — J329 Chronic sinusitis, unspecified: Secondary | ICD-10-CM | POA: Insufficient documentation

## 2019-09-04 DIAGNOSIS — Z6832 Body mass index (BMI) 32.0-32.9, adult: Secondary | ICD-10-CM | POA: Diagnosis not present

## 2019-09-04 DIAGNOSIS — N760 Acute vaginitis: Secondary | ICD-10-CM | POA: Diagnosis not present

## 2019-09-04 DIAGNOSIS — Z3040 Encounter for surveillance of contraceptives, unspecified: Secondary | ICD-10-CM

## 2019-09-04 DIAGNOSIS — H8113 Benign paroxysmal vertigo, bilateral: Secondary | ICD-10-CM

## 2019-09-04 LAB — POCT URINALYSIS DIPSTICK
Bilirubin, UA: NEGATIVE
Glucose, UA: NEGATIVE
Ketones, UA: NEGATIVE
Leukocytes, UA: NEGATIVE
Nitrite, UA: NEGATIVE
Protein, UA: NEGATIVE
Spec Grav, UA: 1.025 (ref 1.010–1.025)
Urobilinogen, UA: 0.2 E.U./dL
pH, UA: 5 (ref 5.0–8.0)

## 2019-09-04 MED ORDER — BUSPIRONE HCL 10 MG PO TABS: 20.0000 mg | ORAL_TABLET | Freq: Two times a day (BID) | ORAL | 2 refills | Status: DC

## 2019-09-04 MED ORDER — CRYSELLE-28 0.3-30 MG-MCG PO TABS: 1.0000 | ORAL_TABLET | Freq: Every day | ORAL | 1 refills | Status: DC

## 2019-09-04 MED ORDER — FLUCONAZOLE 150 MG PO TABS: ORAL_TABLET | ORAL | 0 refills | Status: DC

## 2019-09-04 MED ORDER — AMOXICILLIN-POT CLAVULANATE 875-125 MG PO TABS: 1.0000 | ORAL_TABLET | Freq: Two times a day (BID) | ORAL | 0 refills | Status: DC

## 2019-09-04 MED ORDER — MECLIZINE HCL 25 MG PO TABS: 25.0000 mg | ORAL_TABLET | Freq: Three times a day (TID) | ORAL | 2 refills | Status: DC | PRN

## 2019-09-04 NOTE — Assessment & Plan Note (Signed)
Has reported vertigo that comes/goes.  Requesting refill on Meclizine to take as needed.  Does not have a history of hypertension, does not have access to a blood pressure cuff.  States symptoms typically happen when she is at work and her stress has increased, with sometimes feeling like she is having a hot flash.    Denies fevers, lightheadedness, LOC, hitting head, visual changes, CP, SOB, abdominal pain, n/v/d.  Plan: 1) Will refill meclizine 2) Will complete labs for further evaluation 3) Will contact you once we have the lab results and will plan to see you back in clinic in 1 month for your physical and preventative care guideline screenings to review.

## 2019-09-04 NOTE — Patient Instructions (Addendum)
As we discussed, you have a sinus infection.  I have sent in a prescription for Augmentin 875-125mg  to take 1 tablet 2x a day for the next 10 days.  This medication may be hard on your stomach, be sure to take with meals.  I have also sent in a prescription for Diflucan to take as directed, as you have reported that you have yeast infections with taking antibiotics.  We have drawn labs today and will be in touch with the results.  We will plan to see you back in 1 month for physical exam and pap testing  You will receive a survey after today's visit either digitally by e-mail or paper by USPS mail. Your experiences and feedback matter to [place].  Please respond so we know how we are doing as we provide care for you.  Call us with any questions/concerns/needs.  It is my goal to be available to you for your health concerns.  Thanks for choosing me to be a partner in your healthcare needs!  Charlaine Dalton, FNP-C Family Nurse Practitioner [facility] Phone: [phone]

## 2019-09-04 NOTE — Progress Notes (Signed)
Subjective:    Patient ID: Cindy Bonilla, female    DOB: 08-23-1980, 39 y.o.   MRN: [id number]  Cindy Bonilla is a 39 y.o. female presenting on 09/04/2019 for Sinusitis (sinus drainage, sore in the Right nostril w/ nasal congestion x 2 weeks ) and Tendonitis (left elbow pain)   HPI  Cindy Bonilla presents to clinic with concerns of acute sinus infection with drainage, sores within the nostrils and nasal drainage as well as left elbow pain.  States has had the elbow pain for a few weeks.  Denies any traumatic injury, popping/snapping/fall.  Denies numbness/tingling/weakness.  Denies fevers, sore throat, change in taste/smell, cough, SOB, CP, abdominal pain, n/v/d.  Has tried an over the counter arm sleeve as a brace without relief of elbow symptoms.  Depression screen [facility] 2/9 09/04/2019 08/07/2019 11/07/2018  Decreased Interest 1 0 0  Down, Depressed, Hopeless 0 0 2  PHQ - 2 Score 1 0 2  Altered sleeping 1 - 2  Tired, decreased energy 1 - 2  Change in appetite 1 - 0  Feeling bad or failure about yourself  0 - 0  Trouble concentrating 0 - 0  Moving slowly or fidgety/restless 0 - 0  Suicidal thoughts 0 - 0  PHQ-9 Score 4 - 6  Difficult doing work/chores Not difficult at all - Somewhat difficult  Some recent data might be hidden    Social History   Tobacco Use  . Smoking status: Former Games developer  . Smokeless tobacco: Never Used  Substance Use Topics  . Alcohol use: Yes    Alcohol/week: 0.0 standard drinks    Comment: occasional  . Drug use: No    Review of Systems  Constitutional: Negative.   HENT: Positive for congestion, nosebleeds, postnasal drip, rhinorrhea, sinus pressure and sinus pain. Negative for dental problem, drooling, ear discharge, ear pain, facial swelling, hearing loss, mouth sores, sneezing, sore throat, tinnitus, trouble swallowing and voice change.   Eyes: Negative.   Respiratory: Negative.   Cardiovascular: Negative.   Gastrointestinal: Negative.   Endocrine:  Negative.   Genitourinary: Negative.   Musculoskeletal: Positive for arthralgias and myalgias. Negative for back pain, gait problem, joint swelling, neck pain and neck stiffness.  Skin: Negative.   Allergic/Immunologic: Negative.   Neurological: Positive for dizziness. Negative for tremors, seizures, syncope, facial asymmetry, speech difficulty, weakness, light-headedness, numbness and headaches.  Hematological: Negative.   Psychiatric/Behavioral: Negative.    Per HPI unless specifically indicated above     Objective:    BP 116/83 (BP Location: Right Arm, Patient Position: Sitting, Cuff Size: Normal)   Pulse 73   Temp (!) 97.5 F (36.4 C) (Temporal)   Ht 5\' 2"  (1.575 m)   Wt 179 lb 6.4 oz (81.4 kg)   BMI 32.81 kg/m   Wt Readings from Last 3 Encounters:  09/04/19 179 lb 6.4 oz (81.4 kg)  04/22/18 160 lb 3.2 oz (72.7 kg)  01/15/18 161 lb 9.6 oz (73.3 kg)    Physical Exam Vitals reviewed.  Constitutional:      General: She is not in acute distress.    Appearance: Normal appearance. She is well-groomed. She is obese. She is not ill-appearing or toxic-appearing.  HENT:     Head: Normocephalic.     Right Ear: Tympanic membrane, ear canal and external ear normal. There is no impacted cerumen.     Left Ear: Tympanic membrane, ear canal and external ear normal. There is no impacted cerumen.     Nose: Congestion and  rhinorrhea present.     Right Nostril: No foreign body.     Left Nostril: No foreign body.     Right Turbinates: Enlarged, swollen and pale.     Left Turbinates: Enlarged, swollen and pale.     Right Sinus: Maxillary sinus tenderness present. No frontal sinus tenderness.     Left Sinus: Maxillary sinus tenderness present. No frontal sinus tenderness.     Mouth/Throat:     Mouth: Mucous membranes are moist.     Pharynx: Oropharynx is clear.     Comments: Yellow PND Eyes:     General: Lids are normal. Vision grossly intact.        Right eye: No discharge.        Left  eye: No discharge.     Extraocular Movements: Extraocular movements intact.     Conjunctiva/sclera: Conjunctivae normal.     Pupils: Pupils are equal, round, and reactive to light.  Neck:     Comments: Shotty, mobile, non tender, anterior cervical lymphadenopathy Cardiovascular:     Rate and Rhythm: Normal rate and regular rhythm.     Pulses: Normal pulses.     Heart sounds: Normal heart sounds. No murmur. No friction rub. No gallop.   Pulmonary:     Effort: Pulmonary effort is normal. No respiratory distress.     Breath sounds: Normal breath sounds.  Abdominal:     General: Abdomen is flat. Bowel sounds are normal.     Palpations: Abdomen is soft.     Tenderness: There is no abdominal tenderness.  Musculoskeletal:        General: Tenderness present. No swelling.     Left upper arm: Normal.     Right elbow: Normal.     Left elbow: No swelling, deformity, effusion or lacerations. Normal range of motion. Tenderness present in lateral epicondyle. No radial head, medial epicondyle or olecranon process tenderness.     Left forearm: Normal.     Cervical back: Normal range of motion and neck supple. No tenderness.  Lymphadenopathy:     Cervical: Cervical adenopathy present.  Skin:    General: Skin is warm and dry.     Capillary Refill: Capillary refill takes less than 2 seconds.  Neurological:     General: No focal deficit present.     Mental Status: She is alert and oriented to person, place, and time.     Cranial Nerves: No cranial nerve deficit.     Sensory: No sensory deficit.     Motor: No weakness.     Coordination: Coordination normal.     Gait: Gait normal.  Psychiatric:        Attention and Perception: Attention and perception normal.        Mood and Affect: Mood and affect normal.        Speech: Speech normal.        Behavior: Behavior normal. Behavior is cooperative.        Thought Content: Thought content normal.        Cognition and Memory: Cognition and memory  normal.        Judgment: Judgment normal.     Results for orders placed or performed in visit on 09/04/19  POCT Urinalysis Dipstick  Result Value Ref Range   Color, UA Yellow    Clarity, UA clear    Glucose, UA Negative Negative   Bilirubin, UA negative    Ketones, UA negative    Spec Grav, UA 1.025 1.010 - 1.025  Blood, UA trace    pH, UA 5.0 5.0 - 8.0   Protein, UA Negative Negative   Urobilinogen, UA 0.2 0.2 or 1.0 E.U./dL   Nitrite, UA negative    Leukocytes, UA Negative Negative   Appearance     Odor        Assessment & Plan:   Problem List Items Addressed This Visit      Respiratory   Sinus infection   Relevant Medications   amoxicillin-clavulanate (AUGMENTIN) 875-125 MG tablet   fluconazole (DIFLUCAN) 150 MG tablet     Other   Anxiety    Stable and well controlled anxiety.  Reported improvement in functioning and denies any panic attacks.  Plan: 1) Continue buspar 15mg  twice daily. 2) Continue with your diet and exercise routine 3) Continue with faith based support system and familial support 4) Follow up in 6 months, or sooner, should the need arise.       Relevant Medications   busPIRone (BUSPAR) 10 MG tablet   Other Relevant Orders   COMPLETE METABOLIC PANEL WITH GFR   Dizziness    Has reported vertigo that comes/goes.  Requesting refill on Meclizine to take as needed.  Does not have a history of hypertension, does not have access to a blood pressure cuff.  States symptoms typically happen when she is at work and her stress has increased, with sometimes feeling like she is having a hot flash.    Denies fevers, lightheadedness, LOC, hitting head, visual changes, CP, SOB, abdominal pain, n/v/d.  Plan: 1) Will refill meclizine 2) Will complete labs for further evaluation 3) Will contact you once we have the lab results and will plan to see you back in clinic in 1 month for your physical and preventative care guideline screenings to review.       Relevant Orders   CBC with Differential   COMPLETE METABOLIC PANEL WITH GFR   Thyroid Panel With TSH   Lipid Profile   HgB A1c    Other Visit Diagnoses    BMI 32.0-32.9,adult    -  Primary   Relevant Orders   CBC with Differential   COMPLETE METABOLIC PANEL WITH GFR   Thyroid Panel With TSH   Lipid Profile   HgB A1c   Acute vaginitis       Relevant Medications   fluconazole (DIFLUCAN) 150 MG tablet   Other Relevant Orders   POCT Urinalysis Dipstick (Completed)   Encounter for refill of prescription for contraception       Relevant Medications   norgestrel-ethinyl estradiol (CRYSELLE-28) 0.3-30 MG-MCG tablet   Benign paroxysmal positional vertigo due to bilateral vestibular disorder       Relevant Medications   meclizine (ANTIVERT) 25 MG tablet   Hematuria, unspecified type       Relevant Orders   Urinalysis, microscopic only      Meds ordered this encounter  Medications  . amoxicillin-clavulanate (AUGMENTIN) 875-125 MG tablet    Sig: Take 1 tablet by mouth 2 (two) times daily.    Dispense:  20 tablet    Refill:  0  . fluconazole (DIFLUCAN) 150 MG tablet    Sig: Take 1 tablet now and repeat dose in 72 hours if having continued symptoms.    Dispense:  2 tablet    Refill:  0  . norgestrel-ethinyl estradiol (CRYSELLE-28) 0.3-30 MG-MCG tablet    Sig: Take 1 tablet by mouth daily.    Dispense:  3 Package    Refill:  1  .  meclizine (ANTIVERT) 25 MG tablet    Sig: Take 1 tablet (25 mg total) by mouth 3 (three) times daily as needed for dizziness.    Dispense:  30 tablet    Refill:  2  . busPIRone (BUSPAR) 10 MG tablet    Sig: Take 2 tablets (20 mg total) by mouth 2 (two) times daily.    Dispense:  120 tablet    Refill:  2      Follow up plan: Return in about 4 weeks (around 10/02/2019) for PE and PAP.   Harlin Rain, [place] Family Nurse Practitioner [facility] 09/04/2019, 10:50 AM

## 2019-09-04 NOTE — Assessment & Plan Note (Signed)
Stable and well controlled anxiety.  Reported improvement in functioning and denies any panic attacks.  Plan: 1) Continue buspar 15mg  twice daily. 2) Continue with your diet and exercise routine 3) Continue with faith based support system and familial support 4) Follow up in 6 months, or sooner, should the need arise.

## 2019-09-05 LAB — CBC WITH DIFFERENTIAL/PLATELET
Absolute Monocytes: 556 cells/uL (ref 200–950)
Basophils Absolute: 39 cells/uL (ref 0–200)
Basophils Relative: 0.7 %
Eosinophils Absolute: 242 cells/uL (ref 15–500)
Eosinophils Relative: 4.4 %
HCT: 38.3 % (ref 35.0–45.0)
Hemoglobin: 12.9 g/dL (ref 11.7–15.5)
Lymphs Abs: 2453 cells/uL (ref 850–3900)
MCH: 31.7 pg (ref 27.0–33.0)
MCHC: 33.7 g/dL (ref 32.0–36.0)
MCV: 94.1 fL (ref 80.0–100.0)
MPV: 10.7 fL (ref 7.5–12.5)
Monocytes Relative: 10.1 %
Neutro Abs: 2211 cells/uL (ref 1500–7800)
Neutrophils Relative %: 40.2 %
Platelets: 230 10*3/uL (ref 140–400)
RBC: 4.07 10*6/uL (ref 3.80–5.10)
RDW: 12.1 % (ref 11.0–15.0)
Total Lymphocyte: 44.6 %
WBC: 5.5 10*3/uL (ref 3.8–10.8)

## 2019-09-05 LAB — COMPLETE METABOLIC PANEL WITH GFR
AG Ratio: 2 (calc) (ref 1.0–2.5)
ALT: 12 U/L (ref 6–29)
AST: 14 U/L (ref 10–30)
Albumin: 3.9 g/dL (ref 3.6–5.1)
Alkaline phosphatase (APISO): 36 U/L (ref 31–125)
BUN: 17 mg/dL (ref 7–25)
CO2: 25 mmol/L (ref 20–32)
Calcium: 8.9 mg/dL (ref 8.6–10.2)
Chloride: 104 mmol/L (ref 98–110)
Creat: 0.96 mg/dL (ref 0.50–1.10)
GFR, Est African American: 86 mL/min/{1.73_m2} (ref >=60)
GFR, Est Non African American: 74 mL/min/{1.73_m2} (ref >=60)
Globulin: 2 g/dL (calc) (ref 1.9–3.7)
Glucose, Bld: 110 mg/dL — ABNORMAL HIGH (ref 65–99)
Potassium: 4.3 mmol/L (ref 3.5–5.3)
Sodium: 137 mmol/L (ref 135–146)
Total Bilirubin: 0.3 mg/dL (ref 0.2–1.2)
Total Protein: 5.9 g/dL — ABNORMAL LOW (ref 6.1–8.1)

## 2019-09-05 LAB — LIPID PANEL
Cholesterol: 168 mg/dL (ref <200)
HDL: 55 mg/dL (ref >=50)
LDL Cholesterol (Calc): 94 mg/dL (calc)
Non-HDL Cholesterol (Calc): 113 mg/dL (calc) (ref <130)
Total CHOL/HDL Ratio: 3.1 (calc) (ref <5.0)
Triglycerides: 91 mg/dL (ref <150)

## 2019-09-05 LAB — HEMOGLOBIN A1C
Hgb A1c MFr Bld: 5.3 % of total Hgb (ref <5.7)
Mean Plasma Glucose: 105 (calc)
eAG (mmol/L): 5.8 (calc)

## 2019-09-05 LAB — THYROID PANEL WITH TSH
Free Thyroxine Index: 2.4 (ref 1.4–3.8)
T3 Uptake: 26 % (ref 22–35)
T4, Total: 9.4 ug/dL (ref 5.1–11.9)
TSH: 1.33 mIU/L

## 2019-09-05 LAB — URINALYSIS, MICROSCOPIC ONLY
Bacteria, UA: NONE SEEN /HPF
Hyaline Cast: NONE SEEN /LPF
Squamous Epithelial / HPF: NONE SEEN /HPF (ref <=5)
WBC, UA: NONE SEEN /HPF (ref 0–5)

## 2019-09-07 NOTE — Progress Notes (Signed)
Labs are all WNL

## 2019-09-16 ENCOUNTER — Telehealth: Payer: Self-pay | Admitting: Family Medicine

## 2019-09-16 ENCOUNTER — Ambulatory Visit
Admission: EM | Admit: 2019-09-16 | Discharge: 2019-09-16 | Disposition: A | Discharge status: Home / Self Care | Payer: 59 | Attending: Urgent Care | Admitting: Urgent Care

## 2019-09-16 ENCOUNTER — Encounter: Payer: Self-pay | Admitting: Emergency Medicine

## 2019-09-16 ENCOUNTER — Other Ambulatory Visit: Payer: Self-pay

## 2019-09-16 DIAGNOSIS — R109 Unspecified abdominal pain: Secondary | ICD-10-CM

## 2019-09-16 DIAGNOSIS — R110 Nausea: Secondary | ICD-10-CM

## 2019-09-16 DIAGNOSIS — R197 Diarrhea, unspecified: Secondary | ICD-10-CM

## 2019-09-16 HISTORY — DX: Anxiety disorder, unspecified: F41.9

## 2019-09-16 LAB — GASTROINTESTINAL PANEL BY PCR, STOOL (REPLACES STOOL CULTURE)

## 2019-09-16 LAB — C DIFFICILE QUICK SCREEN W PCR REFLEX
C Diff antigen: NEGATIVE
C Diff interpretation: NOT DETECTED
C Diff toxin: NEGATIVE

## 2019-09-16 MED ORDER — ONDANSETRON 4 MG PO TBDP: 4.0000 mg | ORAL_TABLET | Freq: Three times a day (TID) | ORAL | 0 refills | Status: DC | PRN

## 2019-09-16 MED ORDER — DICYCLOMINE HCL 20 MG PO TABS: 20.0000 mg | ORAL_TABLET | Freq: Three times a day (TID) | ORAL | 0 refills | Status: DC | PRN

## 2019-09-16 NOTE — ED Triage Notes (Signed)
Patient in today c/o nausea and diarrhea, abdominal pain and bloating x 3 days. Patient states she had been taking Augmentin since 09/04/19 and symptoms are worse the last 3 days. Patient stopped the antibiotic on Saturday 09/12/19 because symptoms got really bad.

## 2019-09-16 NOTE — Telephone Encounter (Signed)
Pt .said that when she take amoxicillin it was making her sick on her stomach wanted to know what she need to do

## 2019-09-16 NOTE — Discharge Instructions (Addendum)
It was very nice seeing you today in clinic. Thank you for entrusting me with your care.   Symptoms point to diarrhea of possible infectious etiology. With recent antibiotics, I suspect Clostridioides difficile. Will need stool sample to confirm. Collect and return to clinic ASAP.   No antidiarrheal medications until infectious etiology ruled out. If positive for infection, I will call you with further plans.   Use anti-spasmodic (Bentyl) and nausea medication (Zofran) as needed.   Stay HYDRATED. Water and electrolyte containing beverages (Gatorade, Pedialyte) are best to prevent dehydration and electrolyte abnormalities.    Make arrangements to follow up with your regular doctor in 1 week for re-evaluation if not improving. If your symptoms/condition worsens, please seek follow up care either here or in the ER. Please remember, our [facility] providers are "right here with you" when you need [place].   Again, it was my pleasure to take care of you today. Thank you for choosing our clinic. I hope that you start to feel better quickly.   Quentin Mulling, MSN, APRN, FNP-C, CEN Advanced Practice Provider [facility] [place] [facility]

## 2019-09-17 NOTE — ED Provider Notes (Signed)
[place], [place]   Name: Cindy Bonilla DOB: Jun 25, 1981 MRN: [id number] CSN: [id number] PCP: Tarri Fuller, FNP  Arrival date and time:  09/16/19 1145  Chief Complaint:  Diarrhea and Nausea  NOTE: Prior to seeing the patient today, I have reviewed the triage nursing documentation and vital signs. Clinical staff has updated patient's PMH/PSHx, current medication list, and drug allergies/intolerances to ensure comprehensive history available to assist in medical decision making.   History:   HPI: Cindy Bonilla is a 39 y.o. female who presents today with complaints of abdominal pain, nausea, and diarrhea for the last 3 days. Patient denies fever, chills, or other systemic signs of infection; no hypotension, tachycardia, vomiting, or vertiginous symptoms. She has had a decreased appetite overall, however notes that she has been able to consume varying amounts of fluids. She has not eaten anything since yesterday citing that solid food intake triggers her to have diarrheal stools. She has not appreciated any mucous or blood in her stools; no hematochezia or melena. Patient describes the pain in her abdomen as being an intense cramping that she equates to menstrual cramps. Patient's last menstrual period was 08/26/2019 (approximate). Of note, patient recently on a course of amoxicillin-clavulanate for a sinus infection. She started the medication on 09/04/2019 and stopped on 09/12/2019 due to her severe gastrointestinal symptoms (day 8 of 10 day course). She notes that her sinus symptoms have improved. In efforts to conservatively manage her symptoms at home, the patient notes that she has used bismuth subsalicylate, which has not helped to improve her symptoms.   Past Medical History:  Diagnosis Date  . Anxiety   . Arthritis   . Depression   . Urinary urgency     Past Surgical History:  Procedure Laterality Date  . NO PAST SURGERIES    . none      Family History  Problem Relation Age of  Onset  . Diabetes Mother   . Depression Mother   . Aneurysm Brother   . Other Father        "some rare disease"  . Bladder Cancer Neg Hx   . Prostate cancer Neg Hx   . Kidney cancer Neg Hx     Social History   Tobacco Use  . Smoking status: Former Smoker    Quit date: 09/15/2004    Years since quitting: 15.0  . Smokeless tobacco: Never Used  Substance Use Topics  . Alcohol use: Yes    Alcohol/week: 0.0 standard drinks    Comment: occasional  . Drug use: No    Patient Active Problem List   Diagnosis Date Noted  . Sinus infection 09/04/2019  . Dizziness 09/04/2019  . Other insomnia 04/18/2017  . Syncopal episodes 11/14/2015  . Constipation 10/10/2015  . Anxiety 10/10/2015  . Urinary urgency 10/10/2015    Home Medications:    Current Meds  Medication Sig  . busPIRone (BUSPAR) 10 MG tablet Take 2 tablets (20 mg total) by mouth 2 (two) times daily.  . meclizine (ANTIVERT) 25 MG tablet Take 1 tablet (25 mg total) by mouth 3 (three) times daily as needed for dizziness.  . naproxen (NAPROSYN) 500 MG tablet Take 1 tablet (500 mg total) by mouth 2 (two) times daily with a meal. For 2-4 weeks then as needed  . norgestrel-ethinyl estradiol (CRYSELLE-28) 0.3-30 MG-MCG tablet Take 1 tablet by mouth daily.    Allergies:   Motrin [ibuprofen] and Latex  Review of Systems (ROS):  Review of systems NEGATIVE unless otherwise  noted in narrative H&P section.   Vital Signs: Today's Vitals   09/16/19 1207 09/16/19 1208 09/16/19 1250  BP: 118/84    Pulse: 83    Resp: 18    Temp: 98.3 F (36.8 C)    TempSrc: Oral    SpO2: 100%    Weight:  180 lb (81.6 kg)   Height:  5\' 2"  (1.575 m)   PainSc: 8   8     Physical Exam: Physical Exam  Constitutional: She is oriented to person, place, and time and well-developed, well-nourished, and in no distress.  HENT:  Head: Normocephalic and atraumatic.  Eyes: Pupils are equal, round, and reactive to light.  Cardiovascular: Normal rate,  regular rhythm, normal heart sounds and intact distal pulses.  Pulmonary/Chest: Effort normal and breath sounds normal.  Abdominal: Soft. Normal appearance and bowel sounds are normal. She exhibits no distension. There is no hepatosplenomegaly. There is generalized abdominal tenderness. There is no rigidity, no rebound, no guarding, no CVA tenderness, no tenderness at McBurney's point and negative Murphy's sign.  Musculoskeletal:     Cervical back: Normal range of motion and neck supple.  Neurological: She is alert and oriented to person, place, and time. Gait normal.  Skin: Skin is warm and dry. No rash noted. She is not diaphoretic.  Psychiatric: Memory, affect and judgment normal. Her mood appears anxious.  Nursing note and vitals reviewed.   Urgent Care Treatments / Results:   LABS: PLEASE NOTE: all labs that were ordered this encounter are listed, however only abnormal results are displayed.  Labs Reviewed  C DIFFICILE QUICK SCREEN W PCR REFLEX  GASTROINTESTINAL PANEL BY PCR, STOOL (REPLACES STOOL CULTURE)    EKG: -None  RADIOLOGY: No results found.  PROCEDURES: Procedures  MEDICATIONS RECEIVED THIS VISIT: Medications - No data to display  PERTINENT CLINICAL COURSE NOTES/UPDATES:   Initial Impression / Assessment and Plan / Urgent Care Course:  Pertinent labs & imaging results that were available during my care of the patient were personally reviewed by me and considered in my medical decision making (see lab/imaging section of note for values and interpretations).  Cindy Bonilla is a 39 y.o. female who presents to [facility] today with complaints of Diarrhea and Nausea  Patient is well appearing overall in clinic today. She does not appear to be in any acute distress. Presenting symptoms (see HPI) and exam as documented above. Symptoms x 3 days. No fevers, chills, or vomiting. Exam reassuring overall. She presents today normotensive with a normal heart rate of  83 bmp.  Discussed differentials as being AVGE vs. diarrhea of infectious etiology. With recent antibiotic use, Clostridioides difficile is certainly a possibility. Will proceed as follows:   Will send stool sample for Clostridioides difficile testing. Patient unable to provide sample in clinic. Will return sample ASAP for testing. Advised to avoid all antidiarrheal medications, including loperamide and bismuth subsalicylate, until infectious etiology ruled out. Will place on contacting patient with results and plans for treatment once stool study results reviewed.    Will send in prescriptions for ondansetron to help with her nausea and for dicyclomine to help with the abdominal cramping that she is experiencing. May supplement with APAP and/or IBU as needed for pain.    Discussed supportive care measures at home. Patient to rest as much as possible. She was encouraged to ensure adequate hydration (water and ORS) to prevent dehydration and electrolyte derangements.   Current clinical condition warrants patient being out of work in  order to recover from her current injury/illness. She was provided with the appropriate documentation to provide to her place of employment that will allow for her to RTW on 09/19/2019 with no restrictions.   Discussed follow up with primary care physician in 1 week for re-evaluation. I have reviewed the follow up and strict return precautions for any new or worsening symptoms. Patient is aware of symptoms that would be deemed urgent/emergent, and would thus require further evaluation either here or in the emergency department. At the time of discharge, she verbalized understanding and consent with the discharge plan as it was reviewed with her. All questions were fielded by provider and/or clinic staff prior to patient discharge.    ADDENDUM: Patient contacted via MyChart at 1830 PM on 09/16/19 to advised of negative Clostridioides difficile results. She was advised of my  plans to extend out her stool testing to assess for other possible intestinal pathogens. Suggested adding probiotic and/or yogurt in attempts to restore normal GI flora. Patient was encouraged to continue interventions as discussed until results were reviewed. She was encouraged to return a call to the clinic to speak with nursing staff with any questions or concerns. I advised her that I would be in touch again after additional testing results have been received. Confirmed receipt (time stamp) of message noted in chart indicating that patient opened message at 1948 PM.   ADDENDUM: Patient contacted via MyChart at 2332 PM on 09/16/3019 to advise that GI panel results were all negative. Discussed that this effectively ruled out bacterial etiology of her symptoms, thus leading me to believe that her symptoms are related to AVGE. Patient advised that it was safe to add antidiarrheal at this point; loperamide suggested. Patient to continue all of the previously prescribed interventions, especially the fluids (ORS). Patient encouraged to follow up with PCP if symptoms are not improving on the prescribed interventions.  Confirmed receipt (time stamp) of message noted in chart indicating that patient opened message at 2344 PM.   Final Clinical Impressions / Urgent Care Diagnoses:   Final diagnoses:  Diarrhea of presumed infectious origin  Nausea without vomiting  Abdominal cramping    New Prescriptions:  [place] Controlled Substance Registry consulted? Not Applicable  Meds ordered this encounter  Medications  . ondansetron (ZOFRAN-ODT) 4 MG disintegrating tablet    Sig: Take 1 tablet (4 mg total) by mouth every 8 (eight) hours as needed.    Dispense:  15 tablet    Refill:  0  . dicyclomine (BENTYL) 20 MG tablet    Sig: Take 1 tablet (20 mg total) by mouth 3 (three) times daily as needed for spasms.    Dispense:  21 tablet    Refill:  0    Recommended Follow up Care:  Patient encouraged to follow up  with the following provider within the specified time frame, or sooner as dictated by the severity of her symptoms. As always, she was instructed that for any urgent/emergent care needs, she should seek care either here or in the emergency department for more immediate evaluation.  Follow-up Information    Malfi, Jodelle Gross, FNP In 1 week.   Specialty: Family Medicine Why: General reassessment of symptoms if not improving Contact information: [address] [place] [address] [phone]         NOTE: This note was prepared using Dragon dictation software along with smaller phrase technology. Despite my best ability to proofread, there is the potential that transcriptional errors may still occur  from this process, and are completely unintentional.    Karen Kitchens, NP 09/17/19 2141

## 2019-09-29 ENCOUNTER — Encounter: Payer: Self-pay | Admitting: Family Medicine

## 2019-09-29 ENCOUNTER — Other Ambulatory Visit: Payer: Self-pay

## 2019-09-29 ENCOUNTER — Ambulatory Visit (INDEPENDENT_AMBULATORY_CARE_PROVIDER_SITE_OTHER): Payer: 59 | Admitting: Family Medicine

## 2019-09-29 VITALS — BP 114/75 | HR 75 | Temp 97.1°F | Ht 62.0 in | Wt 182.2 lb

## 2019-09-29 DIAGNOSIS — R1010 Upper abdominal pain, unspecified: Secondary | ICD-10-CM

## 2019-09-29 DIAGNOSIS — R8290 Unspecified abnormal findings in urine: Secondary | ICD-10-CM | POA: Diagnosis not present

## 2019-09-29 DIAGNOSIS — R1084 Generalized abdominal pain: Secondary | ICD-10-CM

## 2019-09-29 DIAGNOSIS — R109 Unspecified abdominal pain: Secondary | ICD-10-CM | POA: Insufficient documentation

## 2019-09-29 LAB — POCT URINALYSIS DIPSTICK
Bilirubin, UA: NEGATIVE
Glucose, UA: NEGATIVE
Ketones, UA: NEGATIVE
Leukocytes, UA: NEGATIVE
Nitrite, UA: NEGATIVE
Protein, UA: NEGATIVE
Spec Grav, UA: 1.02 (ref 1.010–1.025)
Urobilinogen, UA: 0.2 E.U./dL
pH, UA: 5 (ref 5.0–8.0)

## 2019-09-29 LAB — POCT URINE PREGNANCY: Preg Test, Ur: NEGATIVE

## 2019-09-29 MED ORDER — OMEPRAZOLE 20 MG PO CPDR: 20.0000 mg | DELAYED_RELEASE_CAPSULE | Freq: Every day | ORAL | 1 refills | Status: DC

## 2019-09-29 NOTE — Patient Instructions (Signed)
Have your labs and breath test completed today and will call you with the results  I have sent in a prescription for Omeprazole to take 1 tablet daily.  It can take up to 4 days to see results with this medication.  I am sending your urine to the lab for microscopy for some red blood cells seen on the point of care testing.  If labs are negative and no response to omeprazole, will refer to GI.  We will plan to see you back in 2 weeks for follow up on abdominal symptoms  You will receive a survey after today's visit either digitally by e-mail or paper by USPS mail. Your experiences and feedback matter to [place].  Please respond so we know how we are doing as we provide care for you.  Call us with any questions/concerns/needs.  It is my goal to be available to you for your health concerns.  Thanks for choosing me to be a partner in your healthcare needs!  Charlaine Dalton, FNP-C Family Nurse Practitioner [facility] Phone: [phone]

## 2019-09-29 NOTE — Progress Notes (Signed)
Subjective:    Patient ID: Cindy Bonilla, female    DOB: 1980/10/21, 39 y.o.   MRN: [id number]  Cindy Bonilla is a 39 y.o. female presenting on 09/29/2019 for Abdominal Pain (intermittent upper abdominal pain that worsen after eating. She describe it as spasm. She notice that diary porducts and spicy foods cause the abdominal pain to worsen. She also complains of nausea and internittent diarrhea w/ blood mixed in the stool. She also complains of constipation at times. x 2 weeks )   HPI  Ms. Hibbitts presents to clinic for c/o upper abdominal pain since 09/13/2019.  Reports has been to Urgent Care and had stool labs done, reported as negative for all infectious processes.  Reports has upper abdominal pain that worsens after eating, describes as a spasm, that once she eats she has to go to the restroom for a bowel movement.  Reports has intermittent diarrhea and constipation.  Has been taking Pepto Bismal as needed with some symptom relief.  Notes that when she does strain, she has some bright red blood on the toilet tissue with hard bowel movements, and without straining does not have any bleeding.  Denies dark stools, nausea/vomiting, weight loss, fatigue, dizziness, lightheadedness, or previous diagnosis of any abdominal concerns.  Has not found anything that makes her symptoms better.  Denies exposure to anyone else with similar symptoms.   Depression screen [facility] 2/9 09/04/2019 08/07/2019 11/07/2018  Decreased Interest 1 0 0  Down, Depressed, Hopeless 0 0 2  PHQ - 2 Score 1 0 2  Altered sleeping 1 - 2  Tired, decreased energy 1 - 2  Change in appetite 1 - 0  Feeling bad or failure about yourself  0 - 0  Trouble concentrating 0 - 0  Moving slowly or fidgety/restless 0 - 0  Suicidal thoughts 0 - 0  PHQ-9 Score 4 - 6  Difficult doing work/chores Not difficult at all - Somewhat difficult  Some recent data might be hidden    Social History   Tobacco Use  . Smoking status: Former Smoker   Quit date: 09/15/2004    Years since quitting: 15.0  . Smokeless tobacco: Never Used  Substance Use Topics  . Alcohol use: Yes    Alcohol/week: 0.0 standard drinks    Comment: occasional  . Drug use: No    Review of Systems  Constitutional: Negative.   HENT: Negative.   Eyes: Negative.   Respiratory: Negative.   Cardiovascular: Negative.   Gastrointestinal: Positive for abdominal pain, blood in stool, constipation and diarrhea. Negative for abdominal distention, anal bleeding, nausea, rectal pain and vomiting.  Endocrine: Negative.   Genitourinary: Negative.   Musculoskeletal: Negative.   Skin: Negative.   Allergic/Immunologic: Negative.   Neurological: Negative.   Hematological: Negative.   Psychiatric/Behavioral: Negative.    Per HPI unless specifically indicated above     Objective:    BP 114/75 (BP Location: Left Arm, Patient Position: Sitting, Cuff Size: Normal)   Pulse 75   Temp (!) 97.1 F (36.2 C) (Temporal)   Ht 5\' 2"  (1.575 m)   Wt 182 lb 3.2 oz (82.6 kg)   BMI 33.32 kg/m   Wt Readings from Last 3 Encounters:  09/29/19 182 lb 3.2 oz (82.6 kg)  09/16/19 180 lb (81.6 kg)  09/04/19 179 lb 6.4 oz (81.4 kg)    Physical Exam Vitals reviewed.  Constitutional:      General: She is not in acute distress.    Appearance: Normal appearance. She  is well-developed. She is obese. She is not ill-appearing or toxic-appearing.  HENT:     Head: Normocephalic.  Eyes:     General: Lids are normal. Vision grossly intact.        Right eye: No discharge.        Left eye: No discharge.     Extraocular Movements: Extraocular movements intact.     Conjunctiva/sclera: Conjunctivae normal.     Pupils: Pupils are equal, round, and reactive to light.  Cardiovascular:     Rate and Rhythm: Normal rate and regular rhythm.     Pulses: Normal pulses.          Dorsalis pedis pulses are 2+ on the right side and 2+ on the left side.       Posterior tibial pulses are 2+ on the right  side and 2+ on the left side.     Heart sounds: Normal heart sounds. No murmur. No friction rub. No gallop.   Pulmonary:     Effort: Pulmonary effort is normal. No respiratory distress.     Breath sounds: Normal breath sounds.  Abdominal:     General: Abdomen is flat. Bowel sounds are normal. There is no distension.     Palpations: Abdomen is soft.     Tenderness: There is generalized abdominal tenderness. There is no right CVA tenderness, left CVA tenderness, guarding or rebound. Negative signs include Murphy's sign, McBurney's sign, psoas sign and obturator sign.     Hernia: No hernia is present.  Musculoskeletal:     Right lower leg: No edema.     Left lower leg: No edema.  Feet:     Right foot:     Skin integrity: Skin integrity normal.     Left foot:     Skin integrity: Skin integrity normal.  Skin:    General: Skin is warm and dry.     Capillary Refill: Capillary refill takes less than 2 seconds.  Neurological:     General: No focal deficit present.     Mental Status: She is alert and oriented to person, place, and time.     Cranial Nerves: No cranial nerve deficit.     Sensory: No sensory deficit.     Motor: No weakness.     Coordination: Coordination normal.     Gait: Gait normal.  Psychiatric:        Attention and Perception: Attention and perception normal.        Mood and Affect: Mood and affect normal.        Speech: Speech normal.        Behavior: Behavior normal. Behavior is cooperative.        Thought Content: Thought content normal.        Cognition and Memory: Cognition and memory normal.        Judgment: Judgment normal.     Results for orders placed or performed in visit on 09/29/19  POCT Urinalysis Dipstick  Result Value Ref Range   Color, UA yellow    Clarity, UA clear    Glucose, UA Negative Negative   Bilirubin, UA negative    Ketones, UA negative    Spec Grav, UA 1.020 1.010 - 1.025   Blood, UA small    pH, UA 5.0 5.0 - 8.0   Protein, UA  Negative Negative   Urobilinogen, UA 0.2 0.2 or 1.0 E.U./dL   Nitrite, UA negative    Leukocytes, UA Negative Negative   Appearance  Odor    POCT urine pregnancy  Result Value Ref Range   Preg Test, Ur Negative Negative      Assessment & Plan:   Problem List Items Addressed This Visit      Other   Abdominal pain - Primary    Reviewed labs from [facility] on 09/16/2019 showing negative c. Diff labs and negative stool evaluation for infectious processes.  Discussed with patient likely H. Pylori, but could be an IBS/IBD with the alternating diarrhea/constipation.   Plan: 1) Labs drawn today (CBC/CMP) and H. Pylori Breath Test to be done 2) Begin Omeprazole 20mg  daily 3) Once labs return will treat based on findings and/or refer to Gastroenterology 4) To return to clinic in 1 week for follow up if not referred to GI      Relevant Medications   omeprazole (PRILOSEC) 20 MG capsule   Other Relevant Orders   POCT Urinalysis Dipstick (Completed)   POCT urine pregnancy (Completed)   H. pylori breath test   CBC with Differential   COMPLETE METABOLIC PANEL WITH GFR    Other Visit Diagnoses    Abnormal urine findings       Relevant Orders   Urinalysis, microscopic only      Meds ordered this encounter  Medications  . omeprazole (PRILOSEC) 20 MG capsule    Sig: Take 1 capsule (20 mg total) by mouth daily.    Dispense:  30 capsule    Refill:  1      Follow up plan: Return in about 2 weeks (around 10/13/2019) for Abdominal pain f/u.   Harlin Rain, [place] Family Nurse Practitioner [facility] 09/29/2019, 8:27 AM

## 2019-09-29 NOTE — Assessment & Plan Note (Signed)
Reviewed labs from [facility] on 09/16/2019 showing negative c. Diff labs and negative stool evaluation for infectious processes.  Discussed with patient likely H. Pylori, but could be an IBS/IBD with the alternating diarrhea/constipation.   Plan: 1) Labs drawn today (CBC/CMP) and H. Pylori Breath Test to be done 2) Begin Omeprazole 20mg  daily 3) Once labs return will treat based on findings and/or refer to Gastroenterology 4) To return to clinic in 1 week for follow up if not referred to GI

## 2019-09-30 ENCOUNTER — Other Ambulatory Visit: Payer: Self-pay | Admitting: Family Medicine

## 2019-09-30 DIAGNOSIS — R1084 Generalized abdominal pain: Secondary | ICD-10-CM

## 2019-09-30 LAB — CBC WITH DIFFERENTIAL/PLATELET
Absolute Monocytes: 518 cells/uL (ref 200–950)
Basophils Absolute: 30 cells/uL (ref 0–200)
Basophils Relative: 0.4 %
Eosinophils Absolute: 218 cells/uL (ref 15–500)
Eosinophils Relative: 2.9 %
HCT: 41.2 % (ref 35.0–45.0)
Hemoglobin: 13.7 g/dL (ref 11.7–15.5)
Lymphs Abs: 2760 cells/uL (ref 850–3900)
MCH: 31.1 pg (ref 27.0–33.0)
MCHC: 33.3 g/dL (ref 32.0–36.0)
MCV: 93.4 fL (ref 80.0–100.0)
MPV: 10.5 fL (ref 7.5–12.5)
Monocytes Relative: 6.9 %
Neutro Abs: 3975 cells/uL (ref 1500–7800)
Neutrophils Relative %: 53 %
Platelets: 254 10*3/uL (ref 140–400)
RBC: 4.41 10*6/uL (ref 3.80–5.10)
RDW: 12 % (ref 11.0–15.0)
Total Lymphocyte: 36.8 %
WBC: 7.5 10*3/uL (ref 3.8–10.8)

## 2019-09-30 LAB — COMPLETE METABOLIC PANEL WITH GFR
AG Ratio: 1.9 (calc) (ref 1.0–2.5)
ALT: 12 U/L (ref 6–29)
AST: 13 U/L (ref 10–30)
Albumin: 3.9 g/dL (ref 3.6–5.1)
Alkaline phosphatase (APISO): 33 U/L (ref 31–125)
BUN: 13 mg/dL (ref 7–25)
CO2: 25 mmol/L (ref 20–32)
Calcium: 8.9 mg/dL (ref 8.6–10.2)
Chloride: 104 mmol/L (ref 98–110)
Creat: 0.89 mg/dL (ref 0.50–1.10)
GFR, Est African American: 94 mL/min/{1.73_m2} (ref >=60)
GFR, Est Non African American: 81 mL/min/{1.73_m2} (ref >=60)
Globulin: 2.1 g/dL (calc) (ref 1.9–3.7)
Glucose, Bld: 117 mg/dL — ABNORMAL HIGH (ref 65–99)
Potassium: 4.2 mmol/L (ref 3.5–5.3)
Sodium: 137 mmol/L (ref 135–146)
Total Bilirubin: 0.3 mg/dL (ref 0.2–1.2)
Total Protein: 6 g/dL — ABNORMAL LOW (ref 6.1–8.1)

## 2019-09-30 LAB — URINALYSIS, MICROSCOPIC ONLY
Bacteria, UA: NONE SEEN /HPF
Hyaline Cast: NONE SEEN /LPF
RBC / HPF: NONE SEEN /HPF (ref 0–2)
Squamous Epithelial / HPF: NONE SEEN /HPF (ref <=5)
WBC, UA: NONE SEEN /HPF (ref 0–5)

## 2019-09-30 LAB — H. PYLORI BREATH TEST: H. pylori Breath Test: NOT DETECTED

## 2019-09-30 NOTE — Progress Notes (Signed)
Referral to GI sent

## 2019-09-30 NOTE — Progress Notes (Signed)
H. Pylori labs negative.  The rest of her labs are all within normal limits.  Would she like a referral to gastroenterology for further evaluation?  Thanks

## 2019-10-02 ENCOUNTER — Other Ambulatory Visit: Payer: Self-pay

## 2019-10-02 ENCOUNTER — Encounter: Payer: Self-pay | Admitting: Family Medicine

## 2019-10-02 ENCOUNTER — Other Ambulatory Visit (HOSPITAL_COMMUNITY)
Admission: RE | Admit: 2019-10-02 | Discharge: 2019-10-02 | Disposition: A | Discharge status: Home / Self Care | Payer: 59 | Source: Ambulatory Visit | Attending: Family Medicine | Admitting: Family Medicine

## 2019-10-02 ENCOUNTER — Ambulatory Visit (INDEPENDENT_AMBULATORY_CARE_PROVIDER_SITE_OTHER): Payer: 59 | Admitting: Family Medicine

## 2019-10-02 VITALS — BP 95/58 | HR 72 | Temp 97.5°F | Ht 62.0 in | Wt 180.0 lb

## 2019-10-02 DIAGNOSIS — Z124 Encounter for screening for malignant neoplasm of cervix: Secondary | ICD-10-CM

## 2019-10-02 NOTE — Progress Notes (Signed)
Subjective:    Patient ID: Cindy Bonilla, female    DOB: 08-29-1980, 39 y.o.   MRN: [id number]  Cindy Bonilla is a 39 y.o. female presenting on 10/02/2019 for Gynecologic Exam   HPI  Ms. Reggio presents to clinic today for PAP testing.  Has acute concerns for continued abdominal pain and has scheduled her visit with gastroenterology for further evaluation.  Denies any new concerns today.  Depression screen [facility] 2/9 09/04/2019 08/07/2019 11/07/2018  Decreased Interest 1 0 0  Down, Depressed, Hopeless 0 0 2  PHQ - 2 Score 1 0 2  Altered sleeping 1 - 2  Tired, decreased energy 1 - 2  Change in appetite 1 - 0  Feeling bad or failure about yourself  0 - 0  Trouble concentrating 0 - 0  Moving slowly or fidgety/restless 0 - 0  Suicidal thoughts 0 - 0  PHQ-9 Score 4 - 6  Difficult doing work/chores Not difficult at all - Somewhat difficult  Some recent data might be hidden    Social History   Tobacco Use  . Smoking status: Former Smoker    Quit date: 09/15/2004    Years since quitting: 15.0  . Smokeless tobacco: Never Used  Substance Use Topics  . Alcohol use: Yes    Alcohol/week: 0.0 standard drinks    Comment: occasional  . Drug use: No    Review of Systems  Constitutional: Negative.   HENT: Negative.   Eyes: Negative.   Respiratory: Negative.   Cardiovascular: Negative.   Gastrointestinal: Positive for abdominal pain. Negative for abdominal distention, anal bleeding, blood in stool, constipation, diarrhea, nausea, rectal pain and vomiting.       No change from 09/30/2019 visit  Endocrine: Negative.   Genitourinary: Negative.   Musculoskeletal: Negative.   Skin: Negative.   Allergic/Immunologic: Negative.   Neurological: Negative.   Hematological: Negative.   Psychiatric/Behavioral: Negative.    Per HPI unless specifically indicated above     Objective:    BP (!) 95/58 (BP Location: Left Arm, Patient Position: Sitting, Cuff Size: Normal)   Pulse 72   Temp (!)  97.5 F (36.4 C) (Temporal)   Ht 5\' 2"  (1.575 m)   Wt 180 lb (81.6 kg)   LMP 09/01/2019 (Approximate)   BMI 32.92 kg/m   Wt Readings from Last 3 Encounters:  10/02/19 180 lb (81.6 kg)  09/29/19 182 lb 3.2 oz (82.6 kg)  09/16/19 180 lb (81.6 kg)    Physical Exam Vitals reviewed.  Constitutional:      General: She is not in acute distress.    Appearance: Normal appearance. She is obese. She is not ill-appearing or toxic-appearing.  HENT:     Head: Normocephalic.  Eyes:     General: Lids are normal. Vision grossly intact.        Right eye: No discharge.        Left eye: No discharge.     Conjunctiva/sclera: Conjunctivae normal.  Cardiovascular:     Pulses: Normal pulses.  Pulmonary:     Effort: Pulmonary effort is normal.  Abdominal:     General: Abdomen is flat. There is no distension.     Palpations: Abdomen is soft.     Tenderness: There is no abdominal tenderness. There is no guarding.     Hernia: There is no hernia in the left inguinal area or right inguinal area.  Genitourinary:    General: Normal vulva.     Exam position: Lithotomy position.  Pubic Area: No rash or pubic lice.      Labia:        Right: No rash, tenderness, lesion or injury.        Left: No rash, tenderness, lesion or injury.      Urethra: No urethral pain, urethral swelling or urethral lesion.     Vagina: Normal.     Cervix: Cervical bleeding present. No cervical motion tenderness, discharge, friability, lesion, erythema or eversion.     Uterus: Normal.      Adnexa: Right adnexa normal and left adnexa normal.     Comments: Bright red blood noted to cervical broom after swab taken.  Patient is 30 days from LMP, likely menses beginning, discussed with patient. Musculoskeletal:     Right lower leg: No edema.     Left lower leg: No edema.  Lymphadenopathy:     Lower Body: No right inguinal adenopathy. No left inguinal adenopathy.  Skin:    General: Skin is warm and dry.     Capillary Refill:  Capillary refill takes less than 2 seconds.  Neurological:     General: No focal deficit present.     Mental Status: She is alert and oriented to person, place, and time.     Cranial Nerves: No cranial nerve deficit.     Sensory: No sensory deficit.     Motor: No weakness.     Coordination: Coordination normal.     Gait: Gait normal.  Psychiatric:        Attention and Perception: Attention and perception normal.        Mood and Affect: Mood and affect normal.        Speech: Speech normal.        Behavior: Behavior normal. Behavior is cooperative.        Thought Content: Thought content normal.        Judgment: Judgment normal.     Results for orders placed or performed in visit on 09/29/19  H. pylori breath test  Result Value Ref Range   H. pylori Breath Test NOT DETECTED NOT DETECT  CBC with Differential  Result Value Ref Range   WBC 7.5 3.8 - 10.8 Thousand/uL   RBC 4.41 3.80 - 5.10 Million/uL   Hemoglobin 13.7 11.7 - 15.5 g/dL   HCT 41.2 35.0 - 45.0 %   MCV 93.4 80.0 - 100.0 fL   MCH 31.1 27.0 - 33.0 pg   MCHC 33.3 32.0 - 36.0 g/dL   RDW 12.0 11.0 - 15.0 %   Platelets 254 140 - 400 Thousand/uL   MPV 10.5 7.5 - 12.5 fL   Neutro Abs 3,975 1,500 - 7,800 cells/uL   Lymphs Abs 2,760 850 - 3,900 cells/uL   Absolute Monocytes 518 200 - 950 cells/uL   Eosinophils Absolute 218 15 - 500 cells/uL   Basophils Absolute 30 0 - 200 cells/uL   Neutrophils Relative % 53 %   Total Lymphocyte 36.8 %   Monocytes Relative 6.9 %   Eosinophils Relative 2.9 %   Basophils Relative 0.4 %  COMPLETE METABOLIC PANEL WITH GFR  Result Value Ref Range   Glucose, Bld 117 (H) 65 - 99 mg/dL   BUN 13 7 - 25 mg/dL   Creat 0.89 0.50 - 1.10 mg/dL   GFR, Est Non African American 81 > OR = 60 mL/min/1.71m2   GFR, Est African American 94 > OR = 60 mL/min/1.10m2   BUN/Creatinine Ratio NOT APPLICABLE 6 - 22 (calc)  Sodium 137 135 - 146 mmol/L   Potassium 4.2 3.5 - 5.3 mmol/L   Chloride 104 98 - 110  mmol/L   CO2 25 20 - 32 mmol/L   Calcium 8.9 8.6 - 10.2 mg/dL   Total Protein 6.0 (L) 6.1 - 8.1 g/dL   Albumin 3.9 3.6 - 5.1 g/dL   Globulin 2.1 1.9 - 3.7 g/dL (calc)   AG Ratio 1.9 1.0 - 2.5 (calc)   Total Bilirubin 0.3 0.2 - 1.2 mg/dL   Alkaline phosphatase (APISO) 33 31 - 125 U/L   AST 13 10 - 30 U/L   ALT 12 6 - 29 U/L  Urine Microscopic  Result Value Ref Range   WBC, UA NONE SEEN 0 - 5 /HPF   RBC / HPF NONE SEEN 0 - 2 /HPF   Squamous Epithelial / LPF NONE SEEN < OR = 5 /HPF   Bacteria, UA NONE SEEN NONE SEEN /HPF   Hyaline Cast NONE SEEN NONE SEEN /LPF  POCT Urinalysis Dipstick  Result Value Ref Range   Color, UA yellow    Clarity, UA clear    Glucose, UA Negative Negative   Bilirubin, UA negative    Ketones, UA negative    Spec Grav, UA 1.020 1.010 - 1.025   Blood, UA small    pH, UA 5.0 5.0 - 8.0   Protein, UA Negative Negative   Urobilinogen, UA 0.2 0.2 or 1.0 E.U./dL   Nitrite, UA negative    Leukocytes, UA Negative Negative   Appearance     Odor    POCT urine pregnancy  Result Value Ref Range   Preg Test, Ur Negative Negative      Assessment & Plan:   Problem List Items Addressed This Visit      Other   Cervical cancer screening - Primary    PAP testing completed.  Specimen sent to the lab for testing.  Plan: 1. PAP testing completed and sent to the lab for processing. 2. Will call once specimen is resulted.       Other Visit Diagnoses    Pap smear for cervical cancer screening       Relevant Orders   Cytology - PAP      No orders of the defined types were placed in this encounter.     Follow up plan: Return in about 6 months (around 04/02/2020) for Anxiety follow up.   Charlaine Dalton, FNP Family Nurse Practitioner [facility] 10/02/2019, 9:57 AM

## 2019-10-02 NOTE — Patient Instructions (Addendum)
We have sent your PAP to the lab for testing.  Will contact you once the results are received.  Continue your current medications as prescribed.  Keep your appointment with Gastroenterology.  We will plan to see you back in 6 months for anxiety follow up  You will receive a survey after today's visit either digitally by e-mail or paper by USPS mail. Your experiences and feedback matter to [place].  Please respond so we know how we are doing as we provide care for you.  Call us with any questions/concerns/needs.  It is my goal to be available to you for your health concerns.  Thanks for choosing me to be a partner in your healthcare needs!  Charlaine Dalton, FNP-C Family Nurse Practitioner [facility] Phone: [phone]

## 2019-10-02 NOTE — Assessment & Plan Note (Signed)
PAP testing completed.  Specimen sent to the lab for testing.  Plan: 1. PAP testing completed and sent to the lab for processing. 2. Will call once specimen is resulted.

## 2019-10-06 LAB — CYTOLOGY - PAP: Diagnosis: NEGATIVE

## 2019-10-07 ENCOUNTER — Ambulatory Visit: Payer: Self-pay | Admitting: *Deleted

## 2019-10-07 NOTE — Telephone Encounter (Signed)
She called in c/o having stomach cramps with dizziness this morning.   Last night while at work she thinks she had an anxiety attack.   "I got dizzy, clammy, hot, couldn't breath and my chest was hurting along with stomach cramps and diarrhea".   I ended up leaving work. She is calling this morning because she is still having the stomach cramps with some dizziness.   She let me know she has talked with Joni Reining about this and she is for a colonoscopy May 11th, 2021.    The protocol was to be seen within 4 hours.   There were no openings today with Danielle Rankin.   Srija really wanted to see Joni Reining and was ok with waiting until tomorrow morning since Joni Reining was familiar with what is going on with her.  Since this was not a new issue I made the appt for 10/08/2019 at 8:00 for a virtual visit.   COVID-19 screening questions completed.  The diarrhea criteria made it a virtual visit.   Pt was ok with this.  I went over the care advice and instructed her to go to the ED if she passed out or started having chest pain again and feeling clammy.   She was agreeable to this plan.  See triage notes.  I sent my notes to [facility] for Danielle Rankin, FNP.    Reason for Disposition . [1] Dizziness caused by heat exposure, sudden standing, or poor fluid intake AND [2] no improvement after 2 hours of rest and fluids  Answer Assessment - Initial Assessment Questions 1. DESCRIPTION: "Describe your dizziness."     I'm having stomach cramps.   Last night at work 10:00 PM I started having the spasms bad.  I thought I would pass out.   I sat down.  When I got up I thought I was going to vomit.   I tried to work again and it happened again.   So I ended up leaving work last night. This happened one other time I felt dizzy while moving    I have anxiety attacks.   I had an anxiety attack last night.   My chest was hurting real bad and I got clammy and I had to sit on the sidewalk last night.   This lasted 30  minutes. 2. LIGHTHEADED: "Do you feel lightheaded?" (e.g., somewhat faint, woozy, weak upon standing)     Yes  I feel having dizziness and weakness along with spasms in my stomach this morning with diarrhea.   My food is not staying on my stomach.  I'm for a colonoscopy May 11th. 3. VERTIGO: "Do you feel like either you or the room is spinning or tilting?" (i.e. vertigo)     No 4. SEVERITY: "How bad is it?"  "Do you feel like you are going to faint?" "Can you stand and walk?"   - MILD - walking normally   - MODERATE - interferes with normal activities (e.g., work, school)    - SEVERE - unable to stand, requires support to walk, feels like passing out now.      I have to hold on to stuff for my balance. 5. ONSET:  "When did the dizziness begin?"     Last night at work.   I couldn't breath.  I had to take my mask off (COVID-19) 6. AGGRAVATING FACTORS: "Does anything make it worse?" (e.g., standing, change in head position)     Standing up from sitting down. 7.  HEART RATE: "Can you tell me your heart rate?" "How many beats in 15 seconds?"  (Note: not all patients can do this)       Not asked 8. CAUSE: "What do you think is causing the dizziness?"     I could be dehydrated but I really don't know. 9. RECURRENT SYMPTOM: "Have you had dizziness before?" If so, ask: "When was the last time?" "What happened that time?"     Yes.   When I first started having problems with my stomach a week or so ago.   10. OTHER SYMPTOMS: "Do you have any other symptoms?" (e.g., fever, chest pain, vomiting, diarrhea, bleeding)       Diarrhea.   Chest pain last night.    I'm having pain with spasms in my stomach right now. 11. PREGNANCY: "Is there any chance you are pregnant?" "When was your last menstrual period?"       No  Protocols used: DIZZINESS [facility]

## 2019-10-08 ENCOUNTER — Other Ambulatory Visit: Payer: Self-pay

## 2019-10-08 ENCOUNTER — Encounter: Payer: Self-pay | Admitting: Family Medicine

## 2019-10-08 ENCOUNTER — Ambulatory Visit (INDEPENDENT_AMBULATORY_CARE_PROVIDER_SITE_OTHER): Payer: 59 | Admitting: Family Medicine

## 2019-10-08 ENCOUNTER — Ambulatory Visit: Payer: Self-pay | Admitting: Family Medicine

## 2019-10-08 DIAGNOSIS — R110 Nausea: Secondary | ICD-10-CM | POA: Diagnosis not present

## 2019-10-08 DIAGNOSIS — R1084 Generalized abdominal pain: Secondary | ICD-10-CM

## 2019-10-08 DIAGNOSIS — R1010 Upper abdominal pain, unspecified: Secondary | ICD-10-CM | POA: Diagnosis not present

## 2019-10-08 MED ORDER — DICYCLOMINE HCL 20 MG PO TABS: 20.0000 mg | ORAL_TABLET | Freq: Three times a day (TID) | ORAL | 0 refills | Status: DC | PRN

## 2019-10-08 MED ORDER — ONDANSETRON 4 MG PO TBDP: 4.0000 mg | ORAL_TABLET | Freq: Three times a day (TID) | ORAL | 0 refills | Status: DC | PRN

## 2019-10-08 NOTE — Progress Notes (Signed)
Virtual Visit via Telephone  The purpose of this virtual visit is to provide medical care while limiting exposure to the novel coronavirus (COVID19) for both patient and office staff.  Consent was obtained for phone visit:  Yes.   Answered questions that patient had about telehealth interaction:  Yes.   I discussed the limitations, risks, security and privacy concerns of performing an evaluation and management service by telephone. I also discussed with the patient that there may be a patient responsible charge related to this service. The patient expressed understanding and agreed to proceed.  Patient is at home and is accessed via telephone Services are provided by Charlaine Dalton, FNP-C from [facility])  ---------------------------------------------------------------------- Chief Complaint  Patient presents with  . Spasms    in stomach, bad episode at work pt almost fainted and anxiety with it 4/13. Pt also had sharp pains in chest.    S: Reviewed CMA documentation. I have called patient and gathered additional HPI as follows:  Cindy Bonilla presents to clinic via telemedicine appointment.  Reports on 10/06/2019 she was at work around approx 10pm and started to have spasms in her stomach, worse than she has previously had.  Reports feeling an anxiety attack, like she was going to pass out, had to sit down to calm down and felt nauseated.  Reports that she feels like she is "tasting blood" at times.  Has been eating frozen meals, as she tolerates these better.  Reports dietary intake of pasta, spinach, broccoli, carrots, bananas, ground beef.  Denies increase in GERD symptoms, coughing/vomiting any blood, bright red stools, black tarry stools.  Has been taking the bentyl before sleep as it has been sedating for her.  Has not tried to take 1/2 tablet and assess her symptoms yet.  Has an appointment on 11/03/2019 with a gastroenterologist in [place].  Requesting out of  work note for 04/13 and 04/14.  Patient is currently home Denies any high risk travel to areas of current concern for COVID19. Denies any known or suspected exposure to person with or possibly with COVID19.  Denies any fevers, chills, sweats, body ache, cough, shortness of breath, sinus pain or pressure, headache  Past Medical History:  Diagnosis Date  . Anxiety   . Arthritis   . Depression   . Urinary urgency    Social History   Tobacco Use  . Smoking status: Former Smoker    Quit date: 09/15/2004    Years since quitting: 15.0  . Smokeless tobacco: Never Used  Substance Use Topics  . Alcohol use: Yes    Alcohol/week: 0.0 standard drinks    Comment: occasional  . Drug use: No    Current Outpatient Medications:  .  busPIRone (BUSPAR) 10 MG tablet, Take 2 tablets (20 mg total) by mouth 2 (two) times daily., Disp: 120 tablet, Rfl: 2 .  dicyclomine (BENTYL) 20 MG tablet, Take 1 tablet (20 mg total) by mouth 3 (three) times daily as needed for spasms., Disp: 21 tablet, Rfl: 0 .  meclizine (ANTIVERT) 25 MG tablet, Take 1 tablet (25 mg total) by mouth 3 (three) times daily as needed for dizziness., Disp: 30 tablet, Rfl: 2 .  norgestrel-ethinyl estradiol (CRYSELLE-28) 0.3-30 MG-MCG tablet, Take 1 tablet by mouth daily., Disp: 3 Package, Rfl: 1 .  omeprazole (PRILOSEC) 20 MG capsule, Take 1 capsule (20 mg total) by mouth daily., Disp: 30 capsule, Rfl: 1 .  ondansetron (ZOFRAN-ODT) 4 MG disintegrating tablet, Take 1 tablet (4 mg  total) by mouth every 8 (eight) hours as needed., Disp: 15 tablet, Rfl: 0  Depression screen [facility] 2/9 09/04/2019 08/07/2019 11/07/2018  Decreased Interest 1 0 0  Down, Depressed, Hopeless 0 0 2  PHQ - 2 Score 1 0 2  Altered sleeping 1 - 2  Tired, decreased energy 1 - 2  Change in appetite 1 - 0  Feeling bad or failure about yourself  0 - 0  Trouble concentrating 0 - 0  Moving slowly or fidgety/restless 0 - 0  Suicidal thoughts 0 - 0  PHQ-9 Score 4 - 6   Difficult doing work/chores Not difficult at all - Somewhat difficult  Some recent data might be hidden    GAD 7 : Generalized Anxiety Score 10/08/2019 09/04/2019 08/07/2019 11/07/2018  Nervous, Anxious, on Edge 1 1 1 1   Control/stop worrying 3 1 2 1   Worry too much - different things 1 1 2 1   Trouble relaxing 3 0 1 1  Restless 1 0 1 0  Easily annoyed or irritable 3 1 1 1   Afraid - awful might happen 1 0 1 1  Total GAD 7 Score 13 4 9 6   Anxiety Difficulty Not difficult at all Somewhat difficult Not difficult at all Somewhat difficult    -------------------------------------------------------------------------- O: No physical exam performed due to remote telephone encounter.  Physical Exam: Patient remotely monitored without video.  Verbal communication appropriate.  Cognition normal.   Recent Results (from the past 2160 hour(s))  POCT Urinalysis Dipstick     Status: Abnormal   Collection Time: 09/04/19 10:29 AM  Result Value Ref Range   Color, UA Yellow    Clarity, UA clear    Glucose, UA Negative Negative   Bilirubin, UA negative    Ketones, UA negative    Spec Grav, UA 1.025 1.010 - 1.025   Blood, UA trace    pH, UA 5.0 5.0 - 8.0   Protein, UA Negative Negative   Urobilinogen, UA 0.2 0.2 or 1.0 E.U./dL   Nitrite, UA negative    Leukocytes, UA Negative Negative   Appearance     Odor    CBC with Differential     Status: None   Collection Time: 09/04/19 10:47 AM  Result Value Ref Range   WBC 5.5 3.8 - 10.8 Thousand/uL   RBC 4.07 3.80 - 5.10 Million/uL   Hemoglobin 12.9 11.7 - 15.5 g/dL   HCT  - 2161 %   MCV 94.1 80.0 - 100.0 fL   MCH 31.7 27.0 - 33.0 pg   MCHC 33.7 32.0 - 36.0 g/dL   RDW 11/04/19 11/04/19 - 67.6 %   Platelets 230 140 - 400 Thousand/uL   MPV 10.7 7.5 - 12.5 fL   Neutro Abs 2,211 1,500 - 7,800 cells/uL   Lymphs Abs 2,453 850 - 3,900 cells/uL   Absolute Monocytes 556 200 - 950 cells/uL   Eosinophils Absolute 242 15 - 500 cells/uL   Basophils  Absolute 39 0 - 200 cells/uL   Neutrophils Relative % 40.2 %   Total Lymphocyte 44.6 %   Monocytes Relative 10.1 %   Eosinophils Relative 4.4 %   Basophils Relative 0.7 %  COMPLETE METABOLIC PANEL WITH GFR     Status: Abnormal   Collection Time: 09/04/19 10:47 AM  Result Value Ref Range   Glucose, Bld 110 (H) 65 - 99 mg/dL    Comment: .            Fasting reference interval . For someone  without known diabetes, a glucose value between 100 and 125 mg/dL is consistent with prediabetes and should be confirmed with a follow-up test. .    BUN 17 7 - 25 mg/dL   Creat 4.13 2.44 - 0.10 mg/dL   GFR, Est Non African American 74 > OR = 60 mL/min/1.18m2   GFR, Est African American 86 > OR = 60 mL/min/1.93m2   BUN/Creatinine Ratio NOT APPLICABLE 6 - 22 (calc)   Sodium 137 135 - 146 mmol/L   Potassium 4.3 3.5 - 5.3 mmol/L   Chloride 104 98 - 110 mmol/L   CO2 25 20 - 32 mmol/L   Calcium 8.9 8.6 - 10.2 mg/dL   Total Protein 5.9 (L) 6.1 - 8.1 g/dL   Albumin 3.9 3.6 - 5.1 g/dL   Globulin 2.0 1.9 - 3.7 g/dL (calc)   AG Ratio 2.0 1.0 - 2.5 (calc)   Total Bilirubin 0.3 0.2 - 1.2 mg/dL   Alkaline phosphatase (APISO) 36 31 - 125 U/L   AST 14 10 - 30 U/L   ALT 12 6 - 29 U/L  Thyroid Panel With TSH     Status: None   Collection Time: 09/04/19 10:47 AM  Result Value Ref Range   T3 Uptake 26 22 - 35 %   T4, Total 9.4 5.1 - 11.9 mcg/dL   Free Thyroxine Index 2.4 1.4 - 3.8   TSH 1.33 mIU/L    Comment:           Reference Range .           > or = 20 Years  0.40-4.50 .                Pregnancy Ranges           First trimester    0.26-2.66           Second trimester   0.55-2.73           Third trimester    0.43-2.91   Lipid Profile     Status: None   Collection Time: 09/04/19 10:47 AM  Result Value Ref Range   Cholesterol 168 <200 mg/dL   HDL 55 > OR = 50 mg/dL   Triglycerides 91 <272 mg/dL   LDL Cholesterol (Calc) 94 mg/dL (calc)    Comment: Reference range: <100 . Desirable range <100  mg/dL for primary prevention;   <70 mg/dL for patients with CHD or diabetic patients  with > or = 2 CHD risk factors. Marland Kitchen LDL-C is now calculated using the Martin-Hopkins  calculation, which is a validated novel method providing  better accuracy than the Friedewald equation in the  estimation of LDL-C.  Horald Pollen et al. Lenox Ahr. 5366;440(34): 2061-2068  (http://education.[facility].com/faq/FAQ164)    Total CHOL/HDL Ratio 3.1 <5.0 (calc)   Non-HDL Cholesterol (Calc) 113 <130 mg/dL (calc)    Comment: For patients with diabetes plus 1 major ASCVD risk  factor, treating to a non-HDL-C goal of <100 mg/dL  (LDL-C of <74 mg/dL) is considered a therapeutic  option.   HgB A1c     Status: None   Collection Time: 09/04/19 10:47 AM  Result Value Ref Range   Hgb A1c MFr Bld 5.3 <5.7 % of total Hgb    Comment: For the purpose of screening for the presence of diabetes: . <5.7%       Consistent with the absence of diabetes 5.7-6.4%    Consistent with increased risk for diabetes             (  prediabetes) > or =6.5%  Consistent with diabetes . This assay result is consistent with a decreased risk of diabetes. . Currently, no consensus exists regarding use of hemoglobin A1c for diagnosis of diabetes in children. . According to American Diabetes Association (ADA) guidelines, hemoglobin A1c <7.0% represents optimal control in non-pregnant diabetic patients. Different metrics may apply to specific patient populations.  Standards of Medical Care in Diabetes(ADA). .    Mean Plasma Glucose 105 (calc)   eAG (mmol/L) 5.8 (calc)  Urinalysis, microscopic only     Status: None   Collection Time: 09/04/19 12:24 PM  Result Value Ref Range   WBC, UA NONE SEEN 0 - 5 /HPF   RBC / HPF 0-2 0 - 2 /HPF   Squamous Epithelial / LPF NONE SEEN < OR = 5 /HPF   Bacteria, UA NONE SEEN NONE SEEN /HPF   Hyaline Cast NONE SEEN NONE SEEN /LPF  C Difficile Quick Screen w PCR reflex     Status: None   Collection  Time: 09/16/19  5:00 PM   Specimen: STOOL  Result Value Ref Range   C Diff antigen NEGATIVE NEGATIVE   C Diff toxin NEGATIVE NEGATIVE   C Diff interpretation No C. difficile detected.     Comment: Performed at [facility], [address] [place], [place] [address]  Gastrointestinal Panel by PCR , Stool     Status: None   Collection Time: 09/16/19  5:00 PM  Result Value Ref Range   Campylobacter species NOT DETECTED NOT DETECTED   Plesimonas shigelloides NOT DETECTED NOT DETECTED   Salmonella species NOT DETECTED NOT DETECTED   Yersinia enterocolitica NOT DETECTED NOT DETECTED   Vibrio species NOT DETECTED NOT DETECTED   Vibrio cholerae NOT DETECTED NOT DETECTED   Enteroaggregative E coli (EAEC) NOT DETECTED NOT DETECTED   Enteropathogenic E coli (EPEC) NOT DETECTED NOT DETECTED   Enterotoxigenic E coli (ETEC) NOT DETECTED NOT DETECTED   Shiga like toxin producing E coli (STEC) NOT DETECTED NOT DETECTED   Shigella/Enteroinvasive E coli (EIEC) NOT DETECTED NOT DETECTED   Cryptosporidium NOT DETECTED NOT DETECTED   Cyclospora cayetanensis NOT DETECTED NOT DETECTED   Entamoeba histolytica NOT DETECTED NOT DETECTED   Giardia lamblia NOT DETECTED NOT DETECTED   Adenovirus F40/41 NOT DETECTED NOT DETECTED   Astrovirus NOT DETECTED NOT DETECTED   Norovirus GI/GII NOT DETECTED NOT DETECTED   Rotavirus A NOT DETECTED NOT DETECTED   Sapovirus (I, II, IV, and V) NOT DETECTED NOT DETECTED    Comment: Performed at [facility], [address] [place], [place] [address]  POCT Urinalysis Dipstick     Status: Abnormal   Collection Time: 09/29/19  8:26 AM  Result Value Ref Range   Color, UA yellow    Clarity, UA clear    Glucose, UA Negative Negative   Bilirubin, UA negative    Ketones, UA negative    Spec Grav, UA 1.020 1.010 - 1.025   Blood, UA small    pH, UA 5.0 5.0 - 8.0   Protein, UA Negative Negative   Urobilinogen, UA 0.2 0.2 or 1.0 E.U./dL   Nitrite, UA  negative    Leukocytes, UA Negative Negative   Appearance     Odor    POCT urine pregnancy     Status: Normal   Collection Time: 09/29/19  8:27 AM  Result Value Ref Range   Preg Test, Ur Negative Negative  H. pylori breath test  Status: None   Collection Time: 09/29/19  8:50 AM  Result Value Ref Range   H. pylori Breath Test NOT DETECTED NOT DETECT    Comment: . Antimicrobials, proton pump inhibitors, and bismuth preparations are known to suppress H. pylori, and  ingestion of these prior to H. pylori diagnostic testing may lead to false negative results. If clinically  indicated, the test may be repeated on a new specimen obtained two weeks after discontinuing treatment. However, a positive result is still clinically valid.   CBC with Differential     Status: None   Collection Time: 09/29/19  8:50 AM  Result Value Ref Range   WBC 7.5 3.8 - 10.8 Thousand/uL   RBC 4.41 3.80 - 5.10 Million/uL   Hemoglobin 13.7 11.7 - 15.5 g/dL   HCT 40.9 81.1 - 91.4 %   MCV 93.4 80.0 - 100.0 fL   MCH 31.1 27.0 - 33.0 pg   MCHC 33.3 32.0 - 36.0 g/dL   RDW 78.2 95.6 - 21.3 %   Platelets 254 140 - 400 Thousand/uL   MPV 10.5 7.5 - 12.5 fL   Neutro Abs 3,975 1,500 - 7,800 cells/uL   Lymphs Abs 2,760 850 - 3,900 cells/uL   Absolute Monocytes 518 200 - 950 cells/uL   Eosinophils Absolute 218 15 - 500 cells/uL   Basophils Absolute 30 0 - 200 cells/uL   Neutrophils Relative % 53 %   Total Lymphocyte 36.8 %   Monocytes Relative 6.9 %   Eosinophils Relative 2.9 %   Basophils Relative 0.4 %  COMPLETE METABOLIC PANEL WITH GFR     Status: Abnormal   Collection Time: 09/29/19  8:50 AM  Result Value Ref Range   Glucose, Bld 117 (H) 65 - 99 mg/dL    Comment: .            Fasting reference interval . For someone without known diabetes, a glucose value between 100 and 125 mg/dL is consistent with prediabetes and should be confirmed with a follow-up test. .    BUN 13 7 - 25 mg/dL   Creat 0.86  5.78 - 4.69 mg/dL   GFR, Est Non African American 81 > OR = 60 mL/min/1.66m2   GFR, Est African American 94 > OR = 60 mL/min/1.65m2   BUN/Creatinine Ratio NOT APPLICABLE 6 - 22 (calc)   Sodium 137 135 - 146 mmol/L   Potassium 4.2 3.5 - 5.3 mmol/L   Chloride 104 98 - 110 mmol/L   CO2 25 20 - 32 mmol/L   Calcium 8.9 8.6 - 10.2 mg/dL   Total Protein 6.0 (L) 6.1 - 8.1 g/dL   Albumin 3.9 3.6 - 5.1 g/dL   Globulin 2.1 1.9 - 3.7 g/dL (calc)   AG Ratio 1.9 1.0 - 2.5 (calc)   Total Bilirubin 0.3 0.2 - 1.2 mg/dL   Alkaline phosphatase (APISO) 33 31 - 125 U/L   AST 13 10 - 30 U/L   ALT 12 6 - 29 U/L  Urine Microscopic     Status: None   Collection Time: 09/29/19  8:50 AM  Result Value Ref Range   WBC, UA NONE SEEN 0 - 5 /HPF   RBC / HPF NONE SEEN 0 - 2 /HPF   Squamous Epithelial / LPF NONE SEEN < OR = 5 /HPF   Bacteria, UA NONE SEEN NONE SEEN /HPF   Hyaline Cast NONE SEEN NONE SEEN /LPF  Cytology - PAP     Status: None   Collection Time:  10/02/19  9:36 AM  Result Value Ref Range   Adequacy      Satisfactory for evaluation; transformation zone component PRESENT.   Diagnosis      - Negative for intraepithelial lesion or malignancy (NILM)    -------------------------------------------------------------------------- A&P:  Problem List Items Addressed This Visit    None      No orders of the defined types were placed in this encounter.   Follow-up: - Return as needed and keep follow up appointment with gastroenterology  Patient verbalizes understanding with the above medical recommendations including the limitation of remote medical advice.  Specific follow-up and call-back criteria were given for patient to follow-up or seek medical care more urgently if needed.   - Time spent in direct consultation with patient on phone: 8 minutes  Charlaine DaltonNicole Marie Colbi Staubs, FNP-C [facility] 10/08/2019, 8:42 AM

## 2019-10-08 NOTE — Patient Instructions (Signed)
As we discussed, try to take 1/2 tablet of Bentyl when at home to see if able to tolerate the dosage without becoming drowsy/sleepy.    Avoid all NSAIDs, as they can be harsh on the stomach and gut lining.    I will be reaching out to the referral coordinator to find out about moving your appointment with gastroenterology to a sooner appointment.  If you begin to have any vomiting of blood, increase in dizziness/lightheadedness, worsening of stomach pain, or begin to have any bright red stools/black tarry stools to PROCEED TO THE EMERGENCY ROOM IMMEDIATELY!  Out of work note was printed and left at the front desk for you to pick up.  We will plan to see you back as needed for this  You will receive a survey after today's visit either digitally by e-mail or paper by USPS mail. Your experiences and feedback matter to [place].  Please respond so we know how we are doing as we provide care for you.  Call us with any questions/concerns/needs.  It is my goal to be available to you for your health concerns.  Thanks for choosing me to be a partner in your healthcare needs!  Charlaine Dalton, FNP-C Family Nurse Practitioner [facility] Phone: [phone]

## 2019-10-08 NOTE — Assessment & Plan Note (Signed)
Has been having continued spasms.  Has been taking the omeprazole without change in symptoms.  Has appointment gastroenterology in [place] for 11/03/2019.  Requesting with referral coordinator if we can request sooner appointment and will contact patient with response.  Strict ER precautions given.

## 2019-10-26 ENCOUNTER — Telehealth: Payer: Self-pay

## 2019-10-26 NOTE — Telephone Encounter (Signed)
Copied from CRM [id number]. Topic: General - Other >> Oct 26, 2019 11:18 AM Dalphine Handing A wrote: Patient would like a callback from nurse in regards to her upcoming colonoscopy. Patient has questions about the drink and samples of intestines. Please advise   I explained to the patient that she is scheduled for a new patient consult, not a colonoscopy. The gastroenterologist will decide if a colonoscopy is necessary for her concerns. Then they will schedule the procedure and give her all neccessary information. The pt verbalize understanding, no questions or concerns.

## 2019-11-03 ENCOUNTER — Ambulatory Visit (INDEPENDENT_AMBULATORY_CARE_PROVIDER_SITE_OTHER): Payer: 59 | Admitting: Gastroenterology

## 2019-11-03 ENCOUNTER — Other Ambulatory Visit: Payer: Self-pay

## 2019-11-03 ENCOUNTER — Encounter: Payer: Self-pay | Admitting: Gastroenterology

## 2019-11-03 VITALS — BP 77/56 | HR 120 | Temp 97.9°F | Ht 62.0 in | Wt 181.4 lb

## 2019-11-03 DIAGNOSIS — R109 Unspecified abdominal pain: Secondary | ICD-10-CM | POA: Diagnosis not present

## 2019-11-03 DIAGNOSIS — R110 Nausea: Secondary | ICD-10-CM | POA: Diagnosis not present

## 2019-11-03 DIAGNOSIS — R197 Diarrhea, unspecified: Secondary | ICD-10-CM | POA: Diagnosis not present

## 2019-11-03 MED ORDER — NA SULFATE-K SULFATE-MG SULF 17.5-3.13-1.6 GM/177ML PO SOLN: 354.0000 mL | Freq: Once | ORAL | 0 refills | Status: AC

## 2019-11-03 NOTE — Addendum Note (Signed)
Addended by: Adela Ports on: 11/03/2019 04:10 PM   Modules accepted: Orders

## 2019-11-03 NOTE — Progress Notes (Signed)
Cindy Bonilla [address]  [place], [place] [address]  Main: [phone]  Fax: [phone]   Gastroenterology Consultation  Referring Provider:     Verl Bangs, FNP Primary Care Physician:  Verl Bangs, FNP Reason for Consultation:    Abdominal pain, nausea , diarrhea        HPI:    Chief Complaint  Patient presents with  . New Patient (Initial Visit)  . Abdominal Pain    Patient stated that she has had generalized abdominal pain, loss of appetite due to being afraid to eat.  . Nausea    Patient stated that she has had nausea but no vomiting. Patient had been dry heaving.    Cindy Bonilla is a 39 y.o. y/o female referred for consultation & management  by Dr. Lorine Bears, Lupita Raider, FNP.  Patient reports 25-month history of abdominal pain, nausea and diarrhea that is all new.  She states all the symptoms started after she took antibiotics for sinus infection.  She stopped taking antibiotics because of her symptoms.  She went to urgent care and GI panel and C. difficile testing was negative on September 16, 2019.  Symptoms have continued since then.  Patient is reporting more than 3-4 loose bowel movements a day, with no blood.  No EGD or colonoscopy.  No hematemesis.  No prior EGD or colonoscopy.  No family history of colon cancer.  Omeprazole was given to her but it is not helping.  H. pylori breath test was negative.  Past Medical History:  Diagnosis Date  . Anxiety   . Arthritis   . Depression   . Urinary urgency     Past Surgical History:  Procedure Laterality Date  . NO PAST SURGERIES    . none      Prior to Admission medications   Medication Sig Start Date End Date Taking? Authorizing Provider  busPIRone (BUSPAR) 10 MG tablet Take 2 tablets (20 mg total) by mouth 2 (two) times daily. 09/04/19  Yes Malfi, Lupita Raider, FNP  dicyclomine (BENTYL) 20 MG tablet Take 1 tablet (20 mg total) by mouth 3 (three) times daily as needed for spasms. 10/08/19  Yes  Malfi, Lupita Raider, FNP  meclizine (ANTIVERT) 25 MG tablet Take 1 tablet (25 mg total) by mouth 3 (three) times daily as needed for dizziness. 09/04/19  Yes Malfi, Lupita Raider, FNP  norgestrel-ethinyl estradiol (CRYSELLE-28) 0.3-30 MG-MCG tablet Take 1 tablet by mouth daily. 09/04/19  Yes Malfi, Lupita Raider, FNP  omeprazole (PRILOSEC) 20 MG capsule Take 1 capsule (20 mg total) by mouth daily. 09/29/19  Yes Malfi, Lupita Raider, FNP  ondansetron (ZOFRAN-ODT) 4 MG disintegrating tablet Take 1 tablet (4 mg total) by mouth every 8 (eight) hours as needed. 10/08/19  Yes Malfi, Lupita Raider, FNP    Family History  Problem Relation Age of Onset  . Diabetes Mother   . Depression Mother   . Aneurysm Brother   . Other Father        "some rare disease"  . Bladder Cancer Neg Hx   . Prostate cancer Neg Hx   . Kidney cancer Neg Hx      Social History   Tobacco Use  . Smoking status: Former Smoker    Quit date: 09/15/2004    Years since quitting: 15.1  . Smokeless tobacco: Never Used  Substance Use Topics  . Alcohol use: Yes    Alcohol/week: 0.0 standard drinks    Comment: occasional  .  Drug use: No    Allergies as of 11/03/2019 - Review Complete 11/03/2019  Allergen Reaction Noted  . Amoxicillin  10/08/2019  . Motrin [ibuprofen] Other (See Comments) 09/28/2015  . Latex Rash 09/28/2015    Review of Systems:    All systems reviewed and negative except where noted in HPI.   Physical Exam:  BP (!) 77/56 (BP Location: Right Arm, Patient Position: Sitting, Cuff Size: Normal)   Pulse (!) 120   Temp 97.9 F (36.6 C) (Oral)   Ht 5\' 2"  (1.575 m)   Wt 181 lb 6.4 oz (82.3 kg)   BMI 33.18 kg/m  No LMP recorded. Psych:  Alert and cooperative. Normal mood and affect. General:   Alert,  Well-developed, well-nourished, pleasant and cooperative in NAD Head:  Normocephalic and atraumatic. Eyes:  Sclera clear, no icterus.   Conjunctiva pink. Ears:  Normal auditory acuity. Nose:  No deformity, discharge, or  lesions. Mouth:  No deformity or lesions,oropharynx pink & moist. Neck:  Supple; no masses or thyromegaly. Abdomen:  Normal bowel sounds.  No bruits.  Soft, tender to palpation diffusely, and non-distended without masses, hepatosplenomegaly or hernias noted.  No guarding or rebound tenderness.    Msk:  Symmetrical without gross deformities. Good, equal movement & strength bilaterally. Pulses:  Normal pulses noted. Extremities:  No clubbing or edema.  No cyanosis. Neurologic:  Alert and oriented x3;  grossly normal neurologically. Skin:  Intact without significant lesions or rashes. No jaundice. Lymph Nodes:  No significant cervical adenopathy. Psych:  Alert and cooperative. Normal mood and affect.   Labs: CBC    Component Value Date/Time   WBC 7.5 09/29/2019 0850   RBC 4.41 09/29/2019 0850   HGB 13.7 09/29/2019 0850   HCT 41.2 09/29/2019 0850   PLT 254 09/29/2019 0850   MCV 93.4 09/29/2019 0850   MCH 31.1 09/29/2019 0850   MCHC 33.3 09/29/2019 0850   RDW 12.0 09/29/2019 0850   LYMPHSABS 2,760 09/29/2019 0850   EOSABS 218 09/29/2019 0850   BASOSABS 30 09/29/2019 0850   CMP     Component Value Date/Time   NA 137 09/29/2019 0850   NA 142 10/18/2015 1047   K 4.2 09/29/2019 0850   CL 104 09/29/2019 0850   CO2 25 09/29/2019 0850   GLUCOSE 117 (H) 09/29/2019 0850   BUN 13 09/29/2019 0850   BUN 6 10/18/2015 1047   CREATININE 0.89 09/29/2019 0850   CALCIUM 8.9 09/29/2019 0850   PROT 6.0 (L) 09/29/2019 0850   PROT 6.5 10/18/2015 1047   ALBUMIN 3.9 10/18/2015 1047   AST 13 09/29/2019 0850   ALT 12 09/29/2019 0850   ALKPHOS 51 10/18/2015 1047   BILITOT 0.3 09/29/2019 0850   BILITOT <0.2 10/18/2015 1047   GFRNONAA 81 09/29/2019 0850   GFRAA 94 09/29/2019 0850    Imaging Studies: No results found.  Assessment and Plan:   Cindy Bonilla is a 39 y.o. y/o female has been referred for 33-month history of abdominal pain, nausea and diarrhea  Patient has had normal work-up  including labs, infectious stool panel, H. pylori breath test but he still has ongoing symptoms  Would recommend EGD and colonoscopy at this time to rule out IBD, microscopic colitis, GI ulcers and other causes  I have discussed alternative options, risks & benefits,  which include, but are not limited to, bleeding, infection, perforation,respiratory complication & drug reaction.  The patient agrees with this plan & written consent will be obtained.    Avoid  NSAID use such as Ibuprofen, Aleeve, advil, motrin, BC and Goodie powder, Naproxen, Meloxicam and others.   Dr Melodie Bouillon  Speech recognition software was used to dictate the above note.

## 2019-11-09 ENCOUNTER — Other Ambulatory Visit: Payer: Self-pay

## 2019-11-09 ENCOUNTER — Telehealth: Payer: Self-pay

## 2019-11-09 MED ORDER — PEG 3350-KCL-NA BICARB-NACL 420 G PO SOLR
ORAL | 0 refills | Status: DC
Start: 2019-11-09 — End: 2020-03-25

## 2019-11-09 NOTE — Telephone Encounter (Signed)
The pt was notified of the recommendation. She verbalize understanding, no questions or concerns.  

## 2019-11-09 NOTE — Telephone Encounter (Signed)
Patient has established with Gastroenterology on 11/03/2019 as the specialist for this concern.  She would need to contact their office to discuss and request note.  Thanks

## 2019-11-09 NOTE — Telephone Encounter (Signed)
Copied from CRM [id number]. Topic: General - Other >> Nov 06, 2019  4:30 PM Angela Nevin wrote: Patient requesting work note for having to leave early due to "dry heaving" she states she has seen Danielle Rankin for this issue numerous times. Please advise.

## 2019-11-12 ENCOUNTER — Other Ambulatory Visit: Payer: Self-pay | Admitting: Family Medicine

## 2019-11-12 ENCOUNTER — Other Ambulatory Visit
Admission: RE | Admit: 2019-11-12 | Discharge: 2019-11-12 | Disposition: A | Discharge status: Home / Self Care | Payer: 59 | Source: Ambulatory Visit | Attending: Gastroenterology | Admitting: Gastroenterology

## 2019-11-12 DIAGNOSIS — Z20822 Contact with and (suspected) exposure to covid-19: Secondary | ICD-10-CM | POA: Diagnosis not present

## 2019-11-12 DIAGNOSIS — R1084 Generalized abdominal pain: Secondary | ICD-10-CM

## 2019-11-12 DIAGNOSIS — Z01812 Encounter for preprocedural laboratory examination: Secondary | ICD-10-CM | POA: Insufficient documentation

## 2019-11-12 DIAGNOSIS — R1010 Upper abdominal pain, unspecified: Secondary | ICD-10-CM

## 2019-11-12 MED ORDER — DICYCLOMINE HCL 20 MG PO TABS: 20.0000 mg | ORAL_TABLET | Freq: Three times a day (TID) | ORAL | 0 refills | Status: DC | PRN

## 2019-11-12 NOTE — Telephone Encounter (Signed)
Requested Prescriptions  Pending Prescriptions Disp Refills  . omeprazole (PRILOSEC) 20 MG capsule [Pharmacy Med Name: Omeprazole 20 MG Oral Capsule Delayed Release] 90 capsule 3    Sig: Take 1 capsule by mouth once daily     Gastroenterology: Proton Pump Inhibitors Passed - 11/12/2019  6:22 PM      Passed - Valid encounter within last 12 months    Recent Outpatient Visits          1 month ago Generalized abdominal pain   [facility], Jodelle Gross, FNP   1 month ago Cervical cancer screening   [facility], Jodelle Gross, FNP   1 month ago Pain of upper abdomen   [facility], Jodelle Gross, FNP   2 months ago BMI 32.0-32.9,adult   [facility], Jodelle Gross, FNP   3 months ago Tendonitis of elbow, left   [facility] Charon, Netta Neat, DO      Future Appointments            In 3 months Maximino Greenland, Dolphus Jenny, MD [facility]

## 2019-11-13 ENCOUNTER — Encounter: Payer: Self-pay | Admitting: Gastroenterology

## 2019-11-13 LAB — SARS CORONAVIRUS 2 (TAT 6-24 HRS): SARS Coronavirus 2: NEGATIVE

## 2019-11-16 ENCOUNTER — Other Ambulatory Visit: Payer: Self-pay

## 2019-11-16 ENCOUNTER — Encounter
Admission: RE | Disposition: A | Discharge status: Home / Self Care | Payer: Self-pay | Source: Home / Self Care | Attending: Gastroenterology

## 2019-11-16 ENCOUNTER — Ambulatory Visit
Admission: RE | Admit: 2019-11-16 | Discharge: 2019-11-16 | Disposition: A | Discharge status: Home / Self Care | Payer: 59 | Attending: Gastroenterology | Admitting: Gastroenterology

## 2019-11-16 ENCOUNTER — Ambulatory Visit: Payer: 59 | Admitting: Registered Nurse

## 2019-11-16 ENCOUNTER — Encounter: Payer: Self-pay | Admitting: Gastroenterology

## 2019-11-16 DIAGNOSIS — Z91040 Latex allergy status: Secondary | ICD-10-CM | POA: Insufficient documentation

## 2019-11-16 DIAGNOSIS — R109 Unspecified abdominal pain: Secondary | ICD-10-CM

## 2019-11-16 DIAGNOSIS — Z79899 Other long term (current) drug therapy: Secondary | ICD-10-CM | POA: Diagnosis not present

## 2019-11-16 DIAGNOSIS — K635 Polyp of colon: Secondary | ICD-10-CM | POA: Diagnosis not present

## 2019-11-16 DIAGNOSIS — K319 Disease of stomach and duodenum, unspecified: Secondary | ICD-10-CM | POA: Diagnosis not present

## 2019-11-16 DIAGNOSIS — Z87891 Personal history of nicotine dependence: Secondary | ICD-10-CM | POA: Insufficient documentation

## 2019-11-16 DIAGNOSIS — F329 Major depressive disorder, single episode, unspecified: Secondary | ICD-10-CM | POA: Diagnosis not present

## 2019-11-16 DIAGNOSIS — Z880 Allergy status to penicillin: Secondary | ICD-10-CM | POA: Insufficient documentation

## 2019-11-16 DIAGNOSIS — M1990 Unspecified osteoarthritis, unspecified site: Secondary | ICD-10-CM | POA: Insufficient documentation

## 2019-11-16 DIAGNOSIS — Z886 Allergy status to analgesic agent status: Secondary | ICD-10-CM | POA: Diagnosis not present

## 2019-11-16 DIAGNOSIS — R197 Diarrhea, unspecified: Secondary | ICD-10-CM | POA: Diagnosis not present

## 2019-11-16 DIAGNOSIS — F419 Anxiety disorder, unspecified: Secondary | ICD-10-CM | POA: Insufficient documentation

## 2019-11-16 HISTORY — PX: COLONOSCOPY WITH PROPOFOL: SHX5780

## 2019-11-16 HISTORY — PX: ESOPHAGOGASTRODUODENOSCOPY (EGD) WITH PROPOFOL: SHX5813

## 2019-11-16 LAB — POCT PREGNANCY, URINE: Preg Test, Ur: NEGATIVE

## 2019-11-16 SURGERY — COLONOSCOPY WITH PROPOFOL
Anesthesia: General

## 2019-11-16 MED ORDER — LIDOCAINE HCL (CARDIAC) PF 100 MG/5ML IV SOSY
PREFILLED_SYRINGE | INTRAVENOUS | Status: DC | PRN
  Administered 2019-11-16: 100 mg via INTRAVENOUS

## 2019-11-16 MED ORDER — GLYCOPYRROLATE 0.2 MG/ML IJ SOLN
INTRAMUSCULAR | Status: DC | PRN
  Administered 2019-11-16: .2 mg via INTRAVENOUS

## 2019-11-16 MED ORDER — ONDANSETRON HCL 4 MG/2ML IJ SOLN
INTRAMUSCULAR | Status: AC
  Administered 2019-11-16: 4 mg
  Filled 2019-11-16: qty 2

## 2019-11-16 MED ORDER — SODIUM CHLORIDE 0.9 % IV SOLN: INTRAVENOUS | Status: DC

## 2019-11-16 MED ORDER — PROPOFOL 10 MG/ML IV BOLUS
INTRAVENOUS | Status: DC | PRN
  Administered 2019-11-16: 80 mg via INTRAVENOUS
  Administered 2019-11-16: 50 mg via INTRAVENOUS

## 2019-11-16 MED ORDER — PROPOFOL 500 MG/50ML IV EMUL
INTRAVENOUS | Status: DC | PRN
  Administered 2019-11-16: 140 ug/kg/min via INTRAVENOUS

## 2019-11-16 NOTE — Transfer of Care (Signed)
Immediate Anesthesia Transfer of Care Note  Patient: Cindy Bonilla  Procedure(s) Performed: COLONOSCOPY WITH PROPOFOL (N/A ) ESOPHAGOGASTRODUODENOSCOPY (EGD) WITH PROPOFOL (N/A )  Patient Location: PACU  Anesthesia Type:General  Level of Consciousness: sedated  Airway & Oxygen Therapy: Patient Spontanous Breathing  Post-op Assessment: Report given to RN and Post -op Vital signs reviewed and stable  Post vital signs: Reviewed and stable  Last Vitals:  Vitals Value Taken Time  BP    Temp    Pulse    Resp    SpO2      Last Pain:  Vitals:   11/16/19 1127  TempSrc: Temporal         Complications: No apparent anesthesia complications

## 2019-11-16 NOTE — Anesthesia Preprocedure Evaluation (Signed)
Anesthesia Evaluation  Patient identified by MRN, date of birth, ID band Patient awake    Reviewed: Allergy & Precautions, NPO status , Patient's Chart, lab work & pertinent test results  History of Anesthesia Complications Negative for: history of anesthetic complications  Airway Mallampati: II  TM Distance: >3 FB Neck ROM: Full    Dental no notable dental hx.    Pulmonary neg sleep apnea, neg COPD, former smoker,    breath sounds clear to auscultation- rhonchi (-) wheezing      Cardiovascular Exercise Tolerance: Good (-) hypertension(-) CAD, (-) Past MI, (-) Cardiac Stents and (-) CABG  Rhythm:Regular Rate:Normal - Systolic murmurs and - Diastolic murmurs    Neuro/Psych neg Seizures PSYCHIATRIC DISORDERS Anxiety Depression negative neurological ROS     GI/Hepatic negative GI ROS, Neg liver ROS,   Endo/Other  negative endocrine ROSneg diabetes  Renal/GU negative Renal ROS     Musculoskeletal  (+) Arthritis ,   Abdominal (+) + obese,   Peds  Hematology negative hematology ROS (+)   Anesthesia Other Findings Past Medical History: No date: Anxiety No date: Arthritis No date: Depression No date: Urinary urgency   Reproductive/Obstetrics                             Anesthesia Physical Anesthesia Plan  ASA: II  Anesthesia Plan: General   Post-op Pain Management:    Induction: Intravenous  PONV Risk Score and Plan: 2 and Propofol infusion  Airway Management Planned: Natural Airway  Additional Equipment:   Intra-op Plan:   Post-operative Plan:   Informed Consent: I have reviewed the patients History and Physical, chart, labs and discussed the procedure including the risks, benefits and alternatives for the proposed anesthesia with the patient or authorized representative who has indicated his/her understanding and acceptance.     Dental advisory given  Plan Discussed with:  CRNA and Anesthesiologist  Anesthesia Plan Comments:         Anesthesia Quick Evaluation

## 2019-11-16 NOTE — H&P (Signed)
Vonda Antigua, MD [address], [place], [place], [place], [address] [address], [place], [place], [place], [address] Phone: [phone]  Fax: [phone]  Primary Care Physician:  Verl Bangs, FNP   Pre-Procedure History & Physical: HPI:  Cindy Bonilla is a 39 y.o. female is here for a colonoscopy and EGD.   Past Medical History:  Diagnosis Date  . Anxiety   . Arthritis   . Depression   . Urinary urgency     Past Surgical History:  Procedure Laterality Date  . NO PAST SURGERIES    . none      Prior to Admission medications   Medication Sig Start Date End Date Taking? Authorizing Provider  norgestrel-ethinyl estradiol (CRYSELLE-28) 0.3-30 MG-MCG tablet Take 1 tablet by mouth daily. 09/04/19  Yes Malfi, Lupita Raider, FNP  ondansetron (ZOFRAN-ODT) 4 MG disintegrating tablet Take 1 tablet (4 mg total) by mouth every 8 (eight) hours as needed. 10/08/19  Yes Malfi, Lupita Raider, FNP  busPIRone (BUSPAR) 10 MG tablet Take 2 tablets (20 mg total) by mouth 2 (two) times daily. 09/04/19   Malfi, Lupita Raider, FNP  dicyclomine (BENTYL) 20 MG tablet Take 1 tablet (20 mg total) by mouth 3 (three) times daily as needed for spasms. 11/12/19   Malfi, Lupita Raider, FNP  meclizine (ANTIVERT) 25 MG tablet Take 1 tablet (25 mg total) by mouth 3 (three) times daily as needed for dizziness. 09/04/19   Malfi, Lupita Raider, FNP  omeprazole (PRILOSEC) 20 MG capsule Take 1 capsule by mouth once daily 11/12/19   Malfi, Lupita Raider, FNP  polyethylene glycol-electrolytes (NULYTELY) 420 g solution Drink one 8 oz glass every 20 minutes until entire container is finished. Start at 5 PM on 11/15/2019. 11/09/19   Virgel Manifold, MD    Allergies as of 11/04/2019 - Review Complete 11/03/2019  Allergen Reaction Noted  . Amoxicillin  10/08/2019  . Motrin [ibuprofen] Other (See Comments) 09/28/2015  . Latex Rash 09/28/2015    Family History  Problem Relation Age of Onset  . Diabetes Mother   . Depression Mother    . Aneurysm Brother   . Other Father        "some rare disease"  . Bladder Cancer Neg Hx   . Prostate cancer Neg Hx   . Kidney cancer Neg Hx     Social History   Socioeconomic History  . Marital status: Significant Other    Spouse name: Not on file  . Number of children: Not on file  . Years of education: Not on file  . Highest education level: Not on file  Occupational History  . Not on file  Tobacco Use  . Smoking status: Former Smoker    Quit date: 09/15/2004    Years since quitting: 15.1  . Smokeless tobacco: Never Used  Substance and Sexual Activity  . Alcohol use: Yes    Alcohol/week: 0.0 standard drinks    Comment: occasional  . Drug use: No  . Sexual activity: Not on file  Other Topics Concern  . Not on file  Social History Narrative  . Not on file   Social Determinants of Health   Financial Resource Strain:   . Difficulty of Paying Living Expenses:   Food Insecurity:   . Worried About Charity fundraiser in the Last Year:   . Arboriculturist in the Last Year:   Transportation Needs:   . Film/video editor (Medical):   Marland Kitchen Lack of Transportation (  Non-Medical):   Physical Activity:   . Days of Exercise per Week:   . Minutes of Exercise per Session:   Stress:   . Feeling of Stress :   Social Connections:   . Frequency of Communication with Friends and Family:   . Frequency of Social Gatherings with Friends and Family:   . Attends Religious Services:   . Active Member of Clubs or Organizations:   . Attends Banker Meetings:   Marland Kitchen Marital Status:   Intimate Partner Violence:   . Fear of Current or Ex-Partner:   . Emotionally Abused:   Marland Kitchen Physically Abused:   . Sexually Abused:     Review of Systems: See HPI, otherwise negative ROS  Physical Exam: BP 116/87   Pulse 63   Temp (!) 97.5 F (36.4 C) (Temporal)   Resp 18   Ht 5\' 2"  (1.575 m)   Wt 81.6 kg   LMP 11/02/2019   SpO2 100%   BMI 32.92 kg/m  General:   Alert,  pleasant  and cooperative in NAD Head:  Normocephalic and atraumatic. Neck:  Supple; no masses or thyromegaly. Lungs:  Clear throughout to auscultation, normal respiratory effort.    Heart:  +S1, +S2, Regular rate and rhythm, No edema. Abdomen:  Soft, nontender and nondistended. Normal bowel sounds, without guarding, and without rebound.   Neurologic:  Alert and  oriented x4;  grossly normal neurologically.  Impression/Plan: Cindy Bonilla is here for a colonoscopy and EGD for Abdominal pain, nausea/vomiting, diarrhea  Risks, benefits, limitations, and alternatives regarding the procedures have been reviewed with the patient.  Questions have been answered.  All parties agreeable.   Charlsie Quest, MD  11/16/2019, 12:08 PM

## 2019-11-16 NOTE — OR Nursing (Signed)
Patient c/o dry cough, nauea with wretching, no emesis.

## 2019-11-16 NOTE — Op Note (Signed)
[facility] Gastroenterology Patient Name: Lesle Faron Procedure Date: 11/16/2019 12:19 PM MRN: [id number] Account #: [id number] Date of Birth: April 28, 1981 Admit Type: Outpatient Age: 39 Room: [facility] ENDO ROOM 2 Gender: Female Note Status: Finalized Procedure:             Colonoscopy Indications:           Abdominal pain, Diarrhea Providers:             Tashawn Greff B. Maximino Greenland MD, MD Referring MD:          Sallye Lat Md, MD (Referring MD) Medicines:             Monitored Anesthesia Care Complications:         No immediate complications. Procedure:             Pre-Anesthesia Assessment:                        - ASA Grade Assessment: II - A patient with mild                         systemic disease.                        - Prior to the procedure, a History and Physical was                         performed, and patient medications, allergies and                         sensitivities were reviewed. The patient's tolerance                         of previous anesthesia was reviewed.                        - The risks and benefits of the procedure and the                         sedation options and risks were discussed with the                         patient. All questions were answered and informed                         consent was obtained.                        - Patient identification and proposed procedure were                         verified prior to the procedure by the physician, the                         nurse, the anesthesiologist, the anesthetist and the                         technician. The procedure was verified in the                         procedure room.  After obtaining informed consent, the colonoscope was                         passed under direct vision. Throughout the procedure,                         the patient's blood pressure, pulse, and oxygen                         saturations were monitored continuously. The                          Colonoscope was introduced through the anus and                         advanced to the the cecum, identified by appendiceal                         orifice and ileocecal valve. The colonoscopy was                         performed with ease. The patient tolerated the                         procedure well. The quality of the bowel preparation                         was good. Findings:      The perianal and digital rectal examinations were normal.      A 5 mm polyp was found in the descending colon. The polyp was sessile.      The exam was otherwise without abnormality.      The rectum, sigmoid colon, descending colon, transverse colon, ascending       colon and cecum appeared normal. Biopsies for histology were taken with       a cold forceps for evaluation of microscopic colitis.      The retroflexed view of the distal rectum and anal verge was normal and       showed no anal or rectal abnormalities. Impression:            - One 5 mm polyp in the descending colon.                        - The examination was otherwise normal.                        - The rectum, sigmoid colon, descending colon,                         transverse colon, ascending colon and cecum are                         normal. Biopsied.                        - The distal rectum and anal verge are normal on                         retroflexion view. Recommendation:        -  Discharge patient to home (with escort).                        - Advance diet as tolerated.                        - Continue present medications.                        - Await pathology results.                        - The findings and recommendations were discussed with                         the patient.                        - The findings and recommendations were discussed with                         the patient's family.                        - Return to primary care physician as previously                          scheduled. Procedure Code(s):     --- Professional ---                        [phone], Colonoscopy, flexible; with biopsy, single or                         multiple Diagnosis Code(s):     --- Professional ---                        K63.5, Polyp of colon                        R10.9, Unspecified abdominal pain                        R19.7, Diarrhea, unspecified CPT copyright 2019 American Medical Association. All rights reserved. The codes documented in this report are preliminary and upon coder review may  be revised to meet current compliance requirements.  Vonda Antigua, MD Margretta Sidle B. Bonna Gains MD, MD 11/16/2019 12:49:57 PM This report has been signed electronically. Number of Addenda: 0 Note Initiated On: 11/16/2019 12:19 PM Scope Withdrawal Time: 0 hours 8 minutes 58 seconds  Total Procedure Duration: 0 hours 14 minutes 2 seconds  Estimated Blood Loss:  Estimated blood loss: none.      [facility]

## 2019-11-16 NOTE — Op Note (Signed)
[facility] Patient Name: Cindy Bonilla Procedure Date: 11/16/2019 12:20 PM MRN: [id number] Account #: [id number] Date of Birth: 07-Aug-1980 Admit Type: Outpatient Age: 39 Room: [facility] ENDO ROOM 2 Gender: Female Note Status: Finalized Procedure:             Upper GI endoscopy Indications:           Abdominal pain Providers:             Elizabth Palka B. Maximino Greenland MD, MD Medicines:             Monitored Anesthesia Care Complications:         No immediate complications. Procedure:             Pre-Anesthesia Assessment:                        - Prior to the procedure, a History and Physical was                         performed, and patient medications, allergies and                         sensitivities were reviewed. The patient's tolerance                         of previous anesthesia was reviewed.                        - The risks and benefits of the procedure and the                         sedation options and risks were discussed with the                         patient. All questions were answered and informed                         consent was obtained.                        - Patient identification and proposed procedure were                         verified prior to the procedure by the physician, the                         nurse, the anesthesiologist, the anesthetist and the                         technician. The procedure was verified in the                         procedure room.                        - ASA Grade Assessment: II - A patient with mild                         systemic disease.  After obtaining informed consent, the endoscope was                         passed under direct vision. Throughout the procedure,                         the patient's blood pressure, pulse, and oxygen                         saturations were monitored continuously. The Endoscope                         was introduced through  the mouth, and advanced to the                         second part of duodenum. The upper GI endoscopy was                         accomplished with ease. The patient tolerated the                         procedure well. Findings:      The examined esophagus was normal.      The entire examined stomach was normal. Biopsies were obtained in the       gastric body, at the incisura and in the gastric antrum with cold       forceps for histology. Biopsies were taken with a cold forceps for       Helicobacter pylori testing.      The duodenal bulb, second portion of the duodenum and examined duodenum       were normal. Impression:            - Normal esophagus.                        - Normal stomach. Biopsied.                        - Normal duodenal bulb, second portion of the duodenum                         and examined duodenum.                        - Biopsies were obtained in the gastric body, at the                         incisura and in the gastric antrum. Recommendation:        - Await pathology results.                        - Discharge patient to home (with escort).                        - Advance diet as tolerated.                        - Continue present medications.                        -  Patient has a contact number available for                         emergencies. The signs and symptoms of potential                         delayed complications were discussed with the patient.                         Return to normal activities tomorrow. Written                         discharge instructions were provided to the patient.                        - Discharge patient to home (with escort).                        - The findings and recommendations were discussed with                         the patient.                        - The findings and recommendations were discussed with                         the patient's family. Procedure Code(s):     --- Professional ---                         [phone], Esophagogastroduodenoscopy, flexible,                         transoral; with biopsy, single or multiple Diagnosis Code(s):     --- Professional ---                        R10.9, Unspecified abdominal pain CPT copyright 2019 American Medical Association. All rights reserved. The codes documented in this report are preliminary and upon coder review may  be revised to meet current compliance requirements.  Vonda Antigua, MD Margretta Sidle B. Bonna Gains MD, MD 11/16/2019 12:29:56 PM This report has been signed electronically. Number of Addenda: 0 Note Initiated On: 11/16/2019 12:20 PM Estimated Blood Loss:  Estimated blood loss: none.      [facility]

## 2019-11-17 ENCOUNTER — Encounter: Payer: Self-pay | Admitting: *Deleted

## 2019-11-17 NOTE — Anesthesia Postprocedure Evaluation (Signed)
Anesthesia Post Note  Patient: Corey Caulfield  Procedure(s) Performed: COLONOSCOPY WITH PROPOFOL (N/A ) ESOPHAGOGASTRODUODENOSCOPY (EGD) WITH PROPOFOL (N/A )  Patient location during evaluation: PACU Anesthesia Type: General Level of consciousness: awake and alert Pain management: pain level controlled Vital Signs Assessment: post-procedure vital signs reviewed and stable Respiratory status: spontaneous breathing, nonlabored ventilation and respiratory function stable Cardiovascular status: blood pressure returned to baseline and stable Postop Assessment: no apparent nausea or vomiting Anesthetic complications: no     Last Vitals:  Vitals:   11/16/19 1259 11/16/19 1309  BP: 108/78 (!) 128/94  Pulse: 95 90  Resp: 16 18  Temp:    SpO2: 100% 100%    Last Pain:  Vitals:   11/16/19 1309  TempSrc:   PainSc: 0-No pain                 Karleen Hampshire

## 2019-11-18 ENCOUNTER — Telehealth: Payer: Self-pay

## 2019-11-18 ENCOUNTER — Other Ambulatory Visit: Payer: Self-pay

## 2019-11-18 ENCOUNTER — Ambulatory Visit: Payer: Self-pay | Admitting: *Deleted

## 2019-11-18 DIAGNOSIS — Z87891 Personal history of nicotine dependence: Secondary | ICD-10-CM | POA: Insufficient documentation

## 2019-11-18 DIAGNOSIS — R112 Nausea with vomiting, unspecified: Secondary | ICD-10-CM | POA: Diagnosis not present

## 2019-11-18 DIAGNOSIS — Z79899 Other long term (current) drug therapy: Secondary | ICD-10-CM | POA: Insufficient documentation

## 2019-11-18 DIAGNOSIS — Z91040 Latex allergy status: Secondary | ICD-10-CM | POA: Insufficient documentation

## 2019-11-18 DIAGNOSIS — R1084 Generalized abdominal pain: Secondary | ICD-10-CM | POA: Insufficient documentation

## 2019-11-18 LAB — URINALYSIS, COMPLETE (UACMP) WITH MICROSCOPIC
Bacteria, UA: NONE SEEN
Bilirubin Urine: NEGATIVE
Glucose, UA: NEGATIVE mg/dL
Ketones, ur: NEGATIVE mg/dL
Leukocytes,Ua: NEGATIVE
Nitrite: NEGATIVE
Protein, ur: NEGATIVE mg/dL
Specific Gravity, Urine: 1.016 (ref 1.005–1.030)
Squamous Epithelial / HPF: NONE SEEN (ref 0–5)
pH: 6 (ref 5.0–8.0)

## 2019-11-18 LAB — COMPREHENSIVE METABOLIC PANEL
ALT: 15 U/L (ref 0–44)
AST: 16 U/L (ref 15–41)
Albumin: 3.9 g/dL (ref 3.5–5.0)
Alkaline Phosphatase: 40 U/L (ref 38–126)
Anion gap: 8 (ref 5–15)
BUN: 8 mg/dL (ref 6–20)
CO2: 26 mmol/L (ref 22–32)
Calcium: 8.9 mg/dL (ref 8.9–10.3)
Chloride: 103 mmol/L (ref 98–111)
Creatinine, Ser: 1.03 mg/dL — ABNORMAL HIGH (ref 0.44–1.00)
GFR calc Af Amer: >60 mL/min (ref >60)
GFR calc non Af Amer: >60 mL/min (ref >60)
Glucose, Bld: 107 mg/dL — ABNORMAL HIGH (ref 70–99)
Potassium: 4.1 mmol/L (ref 3.5–5.1)
Sodium: 137 mmol/L (ref 135–145)
Total Bilirubin: 0.6 mg/dL (ref 0.3–1.2)
Total Protein: 7 g/dL (ref 6.5–8.1)

## 2019-11-18 LAB — SURGICAL PATHOLOGY

## 2019-11-18 LAB — CBC
HCT: 40 % (ref 36.0–46.0)
Hemoglobin: 13.8 g/dL (ref 12.0–15.0)
MCH: 31.4 pg (ref 26.0–34.0)
MCHC: 34.5 g/dL (ref 30.0–36.0)
MCV: 90.9 fL (ref 80.0–100.0)
Platelets: 225 10*3/uL (ref 150–400)
RBC: 4.4 MIL/uL (ref 3.87–5.11)
RDW: 11.8 % (ref 11.5–15.5)
WBC: 6.1 10*3/uL (ref 4.0–10.5)
nRBC: 0 % (ref 0.0–0.2)

## 2019-11-18 LAB — LIPASE, BLOOD: Lipase: 24 U/L (ref 11–51)

## 2019-11-18 MED ORDER — SODIUM CHLORIDE 0.9% FLUSH: 3.0000 mL | Freq: Once | INTRAVENOUS | Status: DC

## 2019-11-18 NOTE — Telephone Encounter (Signed)
Patient called and left me a voicemail wanting a call back. I then called the patient and couldn't leave her a voicemail since it was full. I will try to call her later.

## 2019-11-18 NOTE — Telephone Encounter (Signed)
Pt called in c/o sharp intermittent abd pains in her lower abd and pain up under her breasts since having a colonoscopy on 11/16/2019.  She is bloated and nauseas.   She hasn't been able to eat.   She sees spots when she sits/stands up.  "I have been this way since the colonoscopy".  She called the GI doctor, Dr. Bonna Gains with [facility] this morning and spoke with someone there.   They were supposed to call her back.   Pt missed the call when they returned the call because she did not recognize the number so didn't answer the phone.  Pt called them back and they advised her to go to the ED or call her PCP per pt.  "I feel like I've been cut or scrapped inside".   She mentioned this 2-3 times during our conversation.  She informed me she can't afford any more medical bills.  She told me briefly about mounting medical expenses so she really could not afford the ED.  I let her know her symptoms were concerning because she should not be having any of these symptoms after a colonoscopy especially this far out from the procedure.  I let her know she would probably need a CT scan to find out what is going on and that could not be done in a doctor's office.   She verbalized understanding and agreed to go to the ED.  The protocol was to go to the ED.  She did not have anyone who could take her.   "My daughter doesn't get home from work until 8:00 PM and my boyfriend is working a 12 hour shift today.   I encouraged her to call 911 because she really needed to be evaluated.   "I'm going to call my daughter and see if she can get off of work and take me".   I encouraged her to please call 911 if her daughter could not get off of work and come take her.  She will go to [facility].  I sent my notes to Cyndia Skeeters, FNP so they would be aware of the situation and the ED referral.       She finally agreed to go the the ED. Reason for Disposition . [1] SEVERE pain (e.g., excruciating) AND  [2] present > 1 hour  Answer Assessment - Initial Assessment Questions 1. LOCATION: "Where does it hurt?"      She had colonoscopy on 11/16/2019.   I slept all day after getting home  When I would try to get up I would see spots.   I'm still seeing spots.  Ever since the colonoscopy I've not felt good.   They did samples.   I can't eat and I'm nauseated.   I'm taking medicines the dr. Hanley Seamen me but it's not helping like it was. The people from the GI office told her to call her PCP or go to the ED.  2. RADIATION: "Does the pain shoot anywhere else?" (e.g., chest, back)     I feel a sharp pain in my lower abd.    I feel like I've been scrapped inside. 3. ONSET: "When did the pain begin?" (e.g., minutes, hours or days ago)      Ever since the colonoscopy I've been in pain. 4. SUDDEN: "Gradual or sudden onset?"     Ongoing. 5. PATTERN "Does the pain come and go, or is it constant?"    - If constant: "Is it getting  better, staying the same, or worsening?"      (Note: Constant means the pain never goes away completely; most serious pain is constant and it progresses)     - If intermittent: "How long does it last?" "Do you have pain now?"     (Note: Intermittent means the pain goes away completely between bouts)     The pain comes and goes.  It's a sharp pain in feel in lower abd and sometimes up under my breast. 6. SEVERITY: "How bad is the pain?"  (e.g., Scale 1-10; mild, moderate, or severe)   - MILD (1-3): doesn't interfere with normal activities, abdomen soft and not tender to touch    - MODERATE (4-7): interferes with normal activities or awakens from sleep, tender to touch    - SEVERE (8-10): excruciating pain, doubled over, unable to do any normal activities      8 it takes my breath away when I have the sharp pains. 7. RECURRENT SYMPTOM: "Have you ever had this type of abdominal pain before?" If so, ask: "When was the last time?" and "What happened that time?"      No 8. CAUSE: "What do you  think is causing the abdominal pain?"     The colonoscopy 9. RELIEVING/AGGRAVATING FACTORS: "What makes it better or worse?" (e.g., movement, antacids, bowel movement)     I'm bloated.   I'm having BMs 10. OTHER SYMPTOMS: "Has there been any vomiting, diarrhea, constipation, or urine problems?"       No 11. PREGNANCY: "Is there any chance you are pregnant?" "When was your last menstrual period?"       Not asked  Protocols used: ABDOMINAL PAIN - [facility]

## 2019-11-18 NOTE — ED Triage Notes (Signed)
Pt comes via POV with c/o abdominal pain. Pt states this started a few days go. Pt states some nausea.  Pt states PCP informed her  That she might need a CT of her abdomen. Pt states she had a colonoscopy on Monday and told she shouldn't still have pain.

## 2019-11-19 ENCOUNTER — Emergency Department
Admission: EM | Admit: 2019-11-19 | Discharge: 2019-11-19 | Disposition: A | Discharge status: Home / Self Care | Payer: 59 | Attending: Emergency Medicine | Admitting: Emergency Medicine

## 2019-11-19 ENCOUNTER — Encounter: Payer: Self-pay | Admitting: Radiology

## 2019-11-19 ENCOUNTER — Emergency Department: Payer: 59

## 2019-11-19 DIAGNOSIS — R1084 Generalized abdominal pain: Secondary | ICD-10-CM

## 2019-11-19 DIAGNOSIS — R112 Nausea with vomiting, unspecified: Secondary | ICD-10-CM

## 2019-11-19 MED ORDER — IOHEXOL 300 MG/ML  SOLN
100.0000 mL | Freq: Once | INTRAMUSCULAR | Status: AC | PRN
  Administered 2019-11-19: 100 mL via INTRAVENOUS

## 2019-11-19 NOTE — Discharge Instructions (Addendum)
You have been seen in the Emergency Department (ED) for abdominal pain.  Your evaluation did not identify a clear cause of your symptoms but was generally reassuring.  Try taking the medication you were previously prescribed including the Zofran (for nausea), Bentyl (for abdominal pain/cramping), meclizine (for dizziness), etc.  Please follow up as instructed above regarding today's emergent visit and the symptoms that are bothering you.  Return to the ED if your abdominal pain worsens or fails to improve, you develop bloody vomiting, bloody diarrhea, you are unable to tolerate fluids due to vomiting, fever greater than 101, or other symptoms that concern you.

## 2019-11-19 NOTE — ED Notes (Signed)
Pt to CT via w/c accomp by CT tech 

## 2019-11-19 NOTE — ED Notes (Signed)
PT reports lessened appetite and new onset dizziness

## 2019-11-19 NOTE — ED Provider Notes (Signed)
[facility] Emergency Department Provider Note  ____________________________________________   First MD Initiated Contact with Patient 11/19/19 [phone]     (approximate)  I have reviewed the triage vital signs and the nursing notes.   HISTORY  Chief Complaint Abdominal Pain    HPI Cindy Bonilla is a 38 y.o. female with medical history as listed below who is followed by Dr. Calton Golds with gastroenterology and had endoscopy and colonoscopy procedures 3 to 4 days ago for evaluation of abdominal pain, nausea, vomiting, and diarrhea.  She presents tonight to the emergency department for persistent symptoms.  She continues to have generalized abdominal pain and aching as well as nausea and vomiting and decreased oral intake.  Her diarrhea has resolved and she had 2 normal bowel movements today.  She has been in communication with the staff at the GI clinic who told her she should not still be having symptoms this many days out from the scope procedures and that she should come be evaluated in the emergency department.  She denies fever/chills, sore throat, chest pain, shortness of breath, and dysuria.  Her abdominal pain as well as the nausea and vomiting wax and wane, sometimes severe and currently mild.  Nothing particular makes it better or worse except that eating does seem to make her symptoms worse.  The symptoms are fairly constant compared to before the procedure.  She said that she was told it would be about a week before she had the results from her endoscopy and colonoscopy.  She has had no bleeding.         Past Medical History:  Diagnosis Date  . Anxiety   . Arthritis   . Depression   . Urinary urgency     Patient Active Problem List   Diagnosis Date Noted  . Polyp of colon   . Diarrhea   . Cervical cancer screening 10/02/2019  . Abdominal pain 09/29/2019  . Sinus infection 09/04/2019  . Dizziness 09/04/2019  . Other insomnia 04/18/2017  .  Syncopal episodes 11/14/2015  . Constipation 10/10/2015  . Anxiety 10/10/2015  . Urinary urgency 10/10/2015    Past Surgical History:  Procedure Laterality Date  . COLONOSCOPY WITH PROPOFOL N/A 11/16/2019   Procedure: COLONOSCOPY WITH PROPOFOL;  Surgeon: Pasty Spillers, MD;  Location: [facility] ENDOSCOPY;  Service: Endoscopy;  Laterality: N/A;  . ESOPHAGOGASTRODUODENOSCOPY (EGD) WITH PROPOFOL N/A 11/16/2019   Procedure: ESOPHAGOGASTRODUODENOSCOPY (EGD) WITH PROPOFOL;  Surgeon: Pasty Spillers, MD;  Location: [facility] ENDOSCOPY;  Service: Endoscopy;  Laterality: N/A;  . NO PAST SURGERIES    . none      Prior to Admission medications   Medication Sig Start Date End Date Taking? Authorizing Provider  busPIRone (BUSPAR) 10 MG tablet Take 2 tablets (20 mg total) by mouth 2 (two) times daily. 09/04/19   Malfi, Jodelle Gross, FNP  dicyclomine (BENTYL) 20 MG tablet Take 1 tablet (20 mg total) by mouth 3 (three) times daily as needed for spasms. 11/12/19   Malfi, Jodelle Gross, FNP  meclizine (ANTIVERT) 25 MG tablet Take 1 tablet (25 mg total) by mouth 3 (three) times daily as needed for dizziness. 09/04/19   Malfi, Jodelle Gross, FNP  norgestrel-ethinyl estradiol (CRYSELLE-28) 0.3-30 MG-MCG tablet Take 1 tablet by mouth daily. 09/04/19   Malfi, Jodelle Gross, FNP  omeprazole (PRILOSEC) 20 MG capsule Take 1 capsule by mouth once daily 11/12/19   Malfi, Jodelle Gross, FNP  ondansetron (ZOFRAN-ODT) 4 MG disintegrating tablet Take 1 tablet (4 mg total)  by mouth every 8 (eight) hours as needed. 10/08/19   Malfi, Jodelle Gross, FNP  polyethylene glycol-electrolytes (NULYTELY) 420 g solution Drink one 8 oz glass every 20 minutes until entire container is finished. Start at 5 PM on 11/15/2019. 11/09/19   Pasty Spillers, MD    Allergies Amoxicillin, Motrin [ibuprofen], and Latex  Family History  Problem Relation Age of Onset  . Diabetes Mother   . Depression Mother   . Aneurysm Brother   . Other Father        "some rare  disease"  . Bladder Cancer Neg Hx   . Prostate cancer Neg Hx   . Kidney cancer Neg Hx     Social History Social History   Tobacco Use  . Smoking status: Former Smoker    Quit date: 09/15/2004    Years since quitting: 15.1  . Smokeless tobacco: Never Used  Substance Use Topics  . Alcohol use: Yes    Alcohol/week: 0.0 standard drinks    Comment: occasional  . Drug use: No    Review of Systems Constitutional: No fever/chills Eyes: No visual changes. ENT: No sore throat. Cardiovascular: Denies chest pain. Respiratory: Denies shortness of breath. Gastrointestinal: Abdominal pain, nausea, and vomiting.  Diarrhea has resolved. Genitourinary: Negative for dysuria. Musculoskeletal: Negative for neck pain.  Negative for back pain. Integumentary: Negative for rash. Neurological: Negative for headaches, focal weakness or numbness.   ____________________________________________   PHYSICAL EXAM:  VITAL SIGNS: ED Triage Vitals [11/18/19 1514]  Enc Vitals Group     BP 126/81     Pulse Rate 73     Resp 18     Temp 98.9 F (37.2 C)     Temp src      SpO2 100 %     Weight 81.6 kg (180 lb)     Height 1.575 m (5\' 2" )     Head Circumference      Peak Flow      Pain Score 8     Pain Loc      Pain Edu?      Excl. in GC?     Constitutional: Alert and oriented.  Eyes: Conjunctivae are normal.  Head: Atraumatic. Nose: No congestion/rhinnorhea. Mouth/Throat: Patient is wearing a mask. Neck: No stridor.  No meningeal signs.   Cardiovascular: Normal rate, regular rhythm. Good peripheral circulation. Grossly normal heart sounds. Respiratory: Normal respiratory effort.  No retractions. Gastrointestinal: Soft and nondistended.  Mild tenderness to palpation throughout the abdomen with no focal tenderness. Musculoskeletal: No lower extremity tenderness nor edema. No gross deformities of extremities. Neurologic:  Normal speech and language. No gross focal neurologic deficits are  appreciated.  Skin:  Skin is warm, dry and intact. Psychiatric: Mood and affect are normal. Speech and behavior are normal.  ____________________________________________   LABS (all labs ordered are listed, but only abnormal results are displayed)  Labs Reviewed  COMPREHENSIVE METABOLIC PANEL - Abnormal; Notable for the following components:      Result Value   Glucose, Bld 107 (*)    Creatinine, Ser 1.03 (*)    All other components within normal limits  URINALYSIS, COMPLETE (UACMP) WITH MICROSCOPIC - Abnormal; Notable for the following components:   Color, Urine YELLOW (*)    APPearance CLEAR (*)    Hgb urine dipstick SMALL (*)    All other components within normal limits  LIPASE, BLOOD  CBC  POC URINE PREG, ED   ____________________________________________  EKG  No indication for emergent EKG ____________________________________________  RADIOLOGY I, Loleta Rose, personally viewed and evaluated these images (plain radiographs) as part of my medical decision making, as well as reviewing the written report by the radiologist.  ED MD interpretation: Mild pancolonic edematous mural thickening suggestive of inflammatory or infectious process, no emergent abnormalities.  Official radiology report(s): CT ABDOMEN PELVIS W CONTRAST  Result Date: 11/19/2019 CLINICAL DATA:  Nausea, vomiting and abdominal pain began several days prior EXAM: CT ABDOMEN AND PELVIS WITH CONTRAST TECHNIQUE: Multidetector CT imaging of the abdomen and pelvis was performed using the standard protocol following bolus administration of intravenous contrast. CONTRAST:  OMNIPAQUE IOHEXOL 300 MG/ML  SOLN COMPARISON:  CT 09/16/2015 FINDINGS: Lower chest: Lung bases are clear. Normal heart size. No pericardial effusion. Hepatobiliary: No worrisome focal liver lesions. Smooth liver surface contour. Normal hepatic attenuation. Gallbladder largely decompressed at the time of examination. No frank gallbladder wall  thickening or visible calcified gallstones. No pericholecystic fluid or inflammation. Phrygian cap morphology of the gallbladder fundus, benign variant. No biliary ductal dilatation. Pancreas: Unremarkable. No pancreatic ductal dilatation or surrounding inflammatory changes. Spleen: Normal in size without focal abnormality. Adrenals/Urinary Tract: Normal adrenal glands. Kidneys are normally located with symmetric enhancement and excretion. No suspicious renal lesion, urolithiasis or hydronephrosis. Urinary bladder is largely decompressed at the time of exam and therefore poorly evaluated by CT imaging. No gross bladder abnormality. Stomach/Bowel: Distal esophagus, stomach and duodenal sweep are unremarkable. No small bowel wall thickening or dilatation. No evidence of obstruction. Slightly mobile cecum displaced towards the midline pelvis. A normal appendix is visualized. Mild pancolonic edematous mural thickening and faint pericolonic haze. No pneumatosis or extraluminal fluid or gas. Vascular/Lymphatic: No significant vascular findings are present. No enlarged abdominal or pelvic lymph nodes. Reproductive: Anteverted uterus. No concerning adnexal lesions. Other: No abdominopelvic free fluid or air. Fat containing umbilical hernia is noted. No bowel containing hernias. Musculoskeletal: No acute osseous abnormality or suspicious osseous lesion. IMPRESSION: Mild pancolonic edematous mural thickening and faint pericolonic haze, suggestive of a mild colitis of infectious or inflammatory etiology. No other acute abdominopelvic findings to provide cause for patient's symptoms. Electronically Signed   By: Kreg Shropshire M.D.   On: 11/19/2019 01:15    ____________________________________________   PROCEDURES   Procedure(s) performed (including Critical Care):  Procedures   ____________________________________________   INITIAL IMPRESSION / MDM / ASSESSMENT AND PLAN / ED COURSE  As part of my medical  decision making, I reviewed the following data within the electronic MEDICAL RECORD NUMBER Nursing notes reviewed and incorporated, Labs reviewed , Old chart reviewed and Notes from prior ED visits   Differential diagnosis includes, but is not limited to, IBS, IBD, viral colitis, bacterial infection, perforation status post colonoscopy and endoscopy.  The patient has been waiting for 11 hours in the emergency department due to overwhelming ED and hospital volume.  During that time her vital signs have remained stable.  She is afebrile and not tachycardic.  Her physical exam is generally reassuring with some mild tenderness to palpation but no peritonitis.  Her lab work is within normal limits including metabolic panel and CBC.  Her CT scan shows some mild pancolonic thickening but she is no longer having diarrhea.  She is under the care of gastroenterology.  The patient is reassured by the results tonight.  I sent a message to her GI doctor through [facility] to let her know about the visit so she could follow-up in the patient is comfortable taking the medication she has at home (Zofran,  meclizine, Bentyl, etc.) and following up as an outpatient.  There is no evidence of an emergent medical condition tonight but I gave my usual customary management recommendations and return precautions.           ____________________________________________  FINAL CLINICAL IMPRESSION(S) / ED DIAGNOSES  Final diagnoses:  Generalized abdominal pain  Non-intractable vomiting with nausea, unspecified vomiting type     MEDICATIONS GIVEN DURING THIS VISIT:  Medications  sodium chloride flush (NS) 0.9 % injection 3 mL (has no administration in time range)  iohexol (OMNIPAQUE) 300 MG/ML solution 100 mL (100 mLs Intravenous Contrast Given 11/19/19 0041)     ED Discharge Orders    None      *Please note:  Azaliyah Kennard was evaluated in Emergency Department on 11/19/2019 for the symptoms described in the history of  present illness. She was evaluated in the context of the global COVID-19 pandemic, which necessitated consideration that the patient might be at risk for infection with the SARS-CoV-2 virus that causes COVID-19. Institutional protocols and algorithms that pertain to the evaluation of patients at risk for COVID-19 are in a state of rapid change based on information released by regulatory bodies including the CDC and federal and state organizations. These policies and algorithms were followed during the patient's care in the ED.  Some ED evaluations and interventions may be delayed as a result of limited staffing during the pandemic.*  Note:  This document was prepared using Dragon voice recognition software and may include unintentional dictation errors.   Hinda Kehr, MD 11/19/19 [phone]

## 2019-11-20 NOTE — Telephone Encounter (Signed)
-----   Message from Pasty Spillers, MD sent at 11/19/2019  3:22 PM EDT ----- Kandis Cocking please let the patient know, her biopsy results are benign.  If she still having symptoms, please have her come to clinic next week

## 2019-11-20 NOTE — Telephone Encounter (Signed)
Called patient back and had to leave her a voicemail to call me back. However, since I saw that she had MyChart, I will also send her a message through there. Hopefully patient reaches out so we could schedule her an appointment to come back and see Dr. Maximino Greenland since she continues to have the abdominal pain and vomiting per her ED visit on Nov 19, 2019.

## 2019-11-26 ENCOUNTER — Telehealth: Payer: Self-pay | Admitting: Gastroenterology

## 2019-11-26 NOTE — Telephone Encounter (Signed)
Patient LVM for pathology results. 

## 2019-11-26 NOTE — Telephone Encounter (Signed)
Cindy Spillers, MD  11/19/2019 3:22 PM EDT    Cindy Bonilla please let the patient know, her biopsy results are benign. If she still having symptoms, please have her come to clinic next week   Called patient but had to leave her a voicemail since she did not answer my call letting her know that her pathology report was benign but if she was still having symptoms, to please call us back and schedule an appointment to come back and see Dr. Maximino Greenland.

## 2019-11-30 ENCOUNTER — Other Ambulatory Visit: Payer: Self-pay | Admitting: Family Medicine

## 2019-11-30 DIAGNOSIS — Z3040 Encounter for surveillance of contraceptives, unspecified: Secondary | ICD-10-CM

## 2019-11-30 NOTE — Telephone Encounter (Signed)
Approved per protocol. Requested Prescriptions  Pending Prescriptions Disp Refills  . CRYSELLE-28 0.3-30 MG-MCG tablet [Pharmacy Med Name: Cryselle-28 0.3-30 MG-MCG Oral Tablet] 28 tablet 3    Sig: Take 1 tablet by mouth once daily     OB/GYN:  Contraceptives Passed - 11/30/2019  2:16 PM      Passed - Last BP in normal range    BP Readings from Last 1 Encounters:  11/19/19 95/68         Passed - Valid encounter within last 12 months    Recent Outpatient Visits          1 month ago Generalized abdominal pain   [facility], Jodelle Gross, FNP   1 month ago Cervical cancer screening   [facility], Jodelle Gross, FNP   2 months ago Pain of upper abdomen   [facility], Jodelle Gross, FNP   2 months ago BMI 32.0-32.9,adult   [facility], Jodelle Gross, FNP   3 months ago Tendonitis of elbow, left   [facility], Netta Neat, DO      Future Appointments            In 1 month Maximino Greenland, Dolphus Jenny, MD [facility] [place]

## 2020-01-08 ENCOUNTER — Other Ambulatory Visit: Payer: Self-pay | Admitting: Family Medicine

## 2020-01-08 DIAGNOSIS — F419 Anxiety disorder, unspecified: Secondary | ICD-10-CM

## 2020-01-08 NOTE — Telephone Encounter (Signed)
Requested Prescriptions  Pending Prescriptions Disp Refills  . busPIRone (BUSPAR) 10 MG tablet [Pharmacy Med Name: busPIRone HCl 10 MG Oral Tablet] 360 tablet 0    Sig: Take 2 tablets by mouth twice daily     Psychiatry: Anxiolytics/Hypnotics - Non-controlled Passed - 01/08/2020  2:04 PM      Passed - Valid encounter within last 6 months    Recent Outpatient Visits          3 months ago Generalized abdominal pain   [facility], Jodelle Gross, FNP   3 months ago Cervical cancer screening   [facility], Jodelle Gross, FNP   3 months ago Pain of upper abdomen   [facility], Jodelle Gross, FNP   4 months ago BMI 32.0-32.9,adult   [facility], Jodelle Gross, FNP   5 months ago Tendonitis of elbow, left   [facility] [place], Netta Neat, DO

## 2020-01-11 ENCOUNTER — Ambulatory Visit: Payer: 59 | Admitting: Gastroenterology

## 2020-01-22 ENCOUNTER — Encounter: Payer: Self-pay | Admitting: Family Medicine

## 2020-02-19 ENCOUNTER — Other Ambulatory Visit: Payer: Self-pay | Admitting: Family Medicine

## 2020-02-19 DIAGNOSIS — Z3040 Encounter for surveillance of contraceptives, unspecified: Secondary | ICD-10-CM

## 2020-02-23 ENCOUNTER — Ambulatory Visit: Payer: BLUE CROSS/BLUE SHIELD | Admitting: Gastroenterology

## 2020-02-23 ENCOUNTER — Other Ambulatory Visit: Payer: Self-pay | Admitting: Family Medicine

## 2020-02-23 DIAGNOSIS — R1010 Upper abdominal pain, unspecified: Secondary | ICD-10-CM

## 2020-02-23 DIAGNOSIS — F419 Anxiety disorder, unspecified: Secondary | ICD-10-CM

## 2020-03-19 ENCOUNTER — Other Ambulatory Visit: Payer: Self-pay | Admitting: Family Medicine

## 2020-03-19 DIAGNOSIS — F419 Anxiety disorder, unspecified: Secondary | ICD-10-CM

## 2020-03-19 NOTE — Telephone Encounter (Signed)
Requested Prescriptions  Pending Prescriptions Disp Refills  . busPIRone (BUSPAR) 10 MG tablet [Pharmacy Med Name: busPIRone HCl 10 MG Oral Tablet] 360 tablet 0    Sig: Take 2 tablets by mouth twice daily     Psychiatry: Anxiolytics/Hypnotics - Non-controlled Passed - 03/19/2020 11:49 AM      Passed - Valid encounter within last 6 months    Recent Outpatient Visits          5 months ago Generalized abdominal pain   [facility], Jodelle Gross, FNP   5 months ago Cervical cancer screening   [facility], Jodelle Gross, FNP   5 months ago Pain of upper abdomen   [facility], Jodelle Gross, FNP   6 months ago BMI 32.0-32.9,adult   [facility], Jodelle Gross, FNP   7 months ago Tendonitis of elbow, left   [facility] [place], Netta Neat, DO      Future Appointments            In 6 days [place], Jodelle Gross, FNP [facility], [facility]

## 2020-03-23 ENCOUNTER — Encounter: Payer: Self-pay | Admitting: Family Medicine

## 2020-03-25 ENCOUNTER — Other Ambulatory Visit: Payer: Self-pay

## 2020-03-25 ENCOUNTER — Ambulatory Visit (INDEPENDENT_AMBULATORY_CARE_PROVIDER_SITE_OTHER): Payer: Self-pay | Admitting: Family Medicine

## 2020-03-25 ENCOUNTER — Ambulatory Visit (INDEPENDENT_AMBULATORY_CARE_PROVIDER_SITE_OTHER): Payer: 59 | Admitting: Family Medicine

## 2020-03-25 ENCOUNTER — Ambulatory Visit: Payer: 59 | Admitting: Family Medicine

## 2020-03-25 ENCOUNTER — Encounter: Payer: Self-pay | Admitting: Family Medicine

## 2020-03-25 VITALS — BP 106/68 | HR 98 | Temp 98.6°F | Ht 62.0 in | Wt 183.0 lb

## 2020-03-25 DIAGNOSIS — K219 Gastro-esophageal reflux disease without esophagitis: Secondary | ICD-10-CM | POA: Diagnosis not present

## 2020-03-25 DIAGNOSIS — F419 Anxiety disorder, unspecified: Secondary | ICD-10-CM | POA: Diagnosis not present

## 2020-03-25 DIAGNOSIS — Z024 Encounter for examination for driving license: Secondary | ICD-10-CM

## 2020-03-25 MED ORDER — OMEPRAZOLE 20 MG PO CPDR: 20.0000 mg | DELAYED_RELEASE_CAPSULE | Freq: Two times a day (BID) | ORAL | 1 refills | Status: DC

## 2020-03-25 MED ORDER — BUSPIRONE HCL 10 MG PO TABS: 20.0000 mg | ORAL_TABLET | Freq: Two times a day (BID) | ORAL | 1 refills | Status: DC

## 2020-03-25 MED ORDER — CITALOPRAM HYDROBROMIDE 10 MG PO TABS: 10.0000 mg | ORAL_TABLET | Freq: Every day | ORAL | 1 refills | Status: DC

## 2020-03-25 NOTE — Assessment & Plan Note (Signed)
DOT Certificate provided x 2 years  Hearing test: Pass at 15' Vision: 20/13 R, 20/13 L, 20/13 Both Uncorrected Urine 1.020, Neg Protein, Neg Glucose, Neg Hematuria  STOPBANG: 0/8

## 2020-03-25 NOTE — Assessment & Plan Note (Signed)
PHQ9-6/GAD7-12.  Reports has had an increase in anxiety with her employment, which has prompted her to find a new employer.  Reports intermittent chest discomfort with increased anxiety.  Has been taking buspar 20mg  BID and likely will benefit from low dose SSRI daily.  Will begin on citalopram 10mg  daily, continue buspar, and RTC in 4 weeks.  Plan: 1. Begin citalopram 10mg  daily 2. Continue buspar 20mg  BID 3. Mood handout to review 4. RTC in 4 weeks, sooner, if needed
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
## 2020-03-25 NOTE — Assessment & Plan Note (Signed)
Has previously been stable on omeprazole 20mg  daily, believes this is not appropriately controlling her GERD, sometimes has globus type sensation in throat.  Discussed returning to GI due to need for possible endoscopy, patient prefers to try omeprazole 20mg  BID and re-evaluate in 4 weeks.  Plan: 1. Begin omeprazole 20mg  BID WC 2. It is encouraged re-evaluation with GI for globus sensation 3. RTC in 4 weeks

## 2020-03-25 NOTE — Patient Instructions (Signed)
I have sent in a prescription for citalopram 10mg  to take 1 tablet daily.  We will plan to meet again in 4 weeks to see how this medication has worked for your anxiety.  I have increased your omeprazole from 20mg  daily to 20mg  TWICE daily.  As we discussed, it is important to follow up with gastroenterology for concerns of globus sensation.  Continue all other medications as directed.  The following recommendations are helpful adjuncts for helping rebalance your mood.  Eat a nourishing diet. Ensure adequate intake of calories, protein, carbs, fat, vitamins, and minerals. Prioritize whole foods at each meal, including meats, vegetables, fruits, nuts and seeds, etc.   Avoid inflammatory and/or "junk" foods, such as sugar, omega-6 fats, refined grains, chemicals, and preservatives are common in packaged and prepared foods. Minimize or completely avoid these ingredients and stick to whole foods with little to no additives. Cook from scratch as much as possible for more control over what you eat  Get enough sleep. Poor sleep is significantly associated with depression and anxiety. Make 7-9 hours of sleep nightly a top priority  Exercise appropriately. Exercise is known to improve brain functioning and boost mood. Aim for 30 minutes of daily physical activity. Avoid "overtraining," which can cause mental disturbances  Assess your light exposure. Not enough natural light during the day and too much artificial light can have a major impact on your mood. Get outside as often as possible during daylight hours. Minimize light exposure after dark and avoid the use of electronics that give off blue light before bed  Manage your stress.  Use daily stress management techniques such as meditation, yoga, or mindfulness to retrain your brain to respond differently to stress. Try deep breathing to deactivate your "fight or flight" response.  There are many of sources with apps like Headspace, Calm or a variety of  YouTube videos (videos from have guided meditation)  Prioritize your social life. Work on building social support with new friends or improve current relationships. Consider getting a pet that allows for companionship, social interaction, and physical touch. Try volunteering or joining a faith-based community to increase your sense of purpose  4-7-8 breathing technique at bedtime: breathe in to count of 4, hold breath for count of 7, exhale for count of 8; do 3-5 times for letting go of overactive thoughts  Take time to play Unstructured "play" time can help reduce anxiety and depression Options for play include music, games, sports, dance, art, etc.  Try to add daily omega 3 fatty acids, magnesium, B complex, and balanced amino acid supplements to help improve mood and anxiety.  We will plan to see you back in 4 weeks for anxiety and GERD follow up visit  You will receive a survey after today's visit either digitally by e-mail or paper by USPS mail. Your experiences and feedback matter to .  Please respond so we know how we are doing as we provide care for you.  Call with any questions/concerns/needs.  It is my goal to be available to you for your health concerns.  Thanks for choosing me to be a partner in your healthcare needs!  Romero Belling, FNP-C Family Nurse Practitioner [facility] Phone: [phone]

## 2020-03-25 NOTE — Patient Instructions (Signed)
DOT Certificate provided x 2 years 

## 2020-03-25 NOTE — Progress Notes (Signed)
Subjective:    Patient ID: Cindy Bonilla, female    DOB: 06-10-1981, 39 y.o.   MRN: [id number]  Cindy Bonilla is a 39 y.o. female presenting on 03/25/2020 for Anxiety and Gastroesophageal Reflux   HPI  Cindy Bonilla presents to clinic for evaluation of anxiety and GERD.  Reports she has had an increase in anxiety which has led to intermittent chest pain, worsens when she is in a stressful situation and is alleviated when she isn't stressed, has recently changed employers due to stress level.    Reports her GERD has been worsening, believes the 20mg  is not controlling her symptoms adequately.  Has not followed up with GI regarding this concern.  Depression screen [facility] 2/9 03/25/2020 09/04/2019 08/07/2019  Decreased Interest 1 1 0  Down, Depressed, Hopeless 1 0 0  PHQ - 2 Score 2 1 0  Altered sleeping 1 1 -  Tired, decreased energy 0 1 -  Change in appetite 1 1 -  Feeling bad or failure about yourself  1 0 -  Trouble concentrating 1 0 -  Moving slowly or fidgety/restless 0 0 -  Suicidal thoughts 0 0 -  PHQ-9 Score 6 4 -  Difficult doing work/chores Not difficult at all Not difficult at all -  Some recent data might be hidden    Social History   Tobacco Use  . Smoking status: Former Smoker    Quit date: 09/15/2004    Years since quitting: 15.5  . Smokeless tobacco: Never Used  Vaping Use  . Vaping Use: Some days  . Substances: Flavoring  Substance Use Topics  . Alcohol use: Yes    Alcohol/week: 0.0 standard drinks    Comment: occasional  . Drug use: No    Review of Systems  Constitutional: Negative.   HENT: Negative.   Eyes: Negative.   Respiratory: Negative.   Cardiovascular: Negative.   Gastrointestinal: Negative.        GERD  Endocrine: Negative.   Genitourinary: Negative.   Musculoskeletal: Negative.   Skin: Negative.   Allergic/Immunologic: Negative.   Neurological: Negative.   Hematological: Negative.   Psychiatric/Behavioral: Negative for agitation,  behavioral problems, confusion, decreased concentration, dysphoric mood, hallucinations, self-injury, sleep disturbance and suicidal ideas. The patient is nervous/anxious. The patient is not hyperactive.    Per HPI unless specifically indicated above     Objective:    BP 106/68 (BP Location: Left Arm, Patient Position: Sitting, Cuff Size: Normal)   Pulse 98   Temp 98.6 F (37 C) (Oral)   Ht 5\' 2"  (1.575 m)   Wt 183 lb (83 kg)   SpO2 100%   BMI 33.47 kg/m   Wt Readings from Last 3 Encounters:  03/25/20 183 lb (83 kg)  11/18/19 180 lb (81.6 kg)  11/16/19 180 lb (81.6 kg)    Physical Exam Vitals and nursing note reviewed.  Constitutional:      General: She is not in acute distress.    Appearance: Normal appearance. She is well-developed and well-groomed. She is obese. She is not ill-appearing or toxic-appearing.  HENT:     Head: Normocephalic and atraumatic.     Nose:     Comments: 11/20/19 is in place, covering mouth and nose. Eyes:     General: Lids are normal. Vision grossly intact.        Right eye: No discharge.        Left eye: No discharge.     Extraocular Movements: Extraocular movements intact.  Conjunctiva/sclera: Conjunctivae normal.     Pupils: Pupils are equal, round, and reactive to light.  Cardiovascular:     Pulses: Normal pulses.  Pulmonary:     Effort: Pulmonary effort is normal. No respiratory distress.  Musculoskeletal:     Right lower leg: No edema.     Left lower leg: No edema.  Skin:    General: Skin is warm and dry.     Capillary Refill: Capillary refill takes less than 2 seconds.  Neurological:     General: No focal deficit present.     Mental Status: She is alert and oriented to person, place, and time.  Psychiatric:        Attention and Perception: Attention and perception normal.        Mood and Affect: Mood and affect normal.        Speech: Speech normal.        Behavior: Behavior normal. Behavior is cooperative.        Thought  Content: Thought content normal.        Cognition and Memory: Cognition and memory normal.        Judgment: Judgment normal.    Results for orders placed or performed during the hospital encounter of 11/19/19  Lipase, blood  Result Value Ref Range   Lipase 24 11 - 51 U/L  Comprehensive metabolic panel  Result Value Ref Range   Sodium 137 135 - 145 mmol/L   Potassium 4.1 3.5 - 5.1 mmol/L   Chloride 103 98 - 111 mmol/L   CO2 26 22 - 32 mmol/L   Glucose, Bld 107 (H) 70 - 99 mg/dL   BUN 8 6 - 20 mg/dL   Creatinine, Ser 1.60 (H) 0.44 - 1.00 mg/dL   Calcium 8.9 8.9 - 73.7 mg/dL   Total Protein 7.0 6.5 - 8.1 g/dL   Albumin 3.9 3.5 - 5.0 g/dL   AST 16 15 - 41 U/L   ALT 15 0 - 44 U/L   Alkaline Phosphatase 40 38 - 126 U/L   Total Bilirubin 0.6 0.3 - 1.2 mg/dL   GFR calc non Af Amer >60 >60 mL/min   GFR calc Af Amer >60 >60 mL/min   Anion gap 8 5 - 15  CBC  Result Value Ref Range   WBC 6.1 4.0 - 10.5 K/uL   RBC 4.40 3.87 - 5.11 MIL/uL   Hemoglobin 13.8 12.0 - 15.0 g/dL   HCT 10.6 36 - 46 %   MCV 90.9 80.0 - 100.0 fL   MCH 31.4 26.0 - 34.0 pg   MCHC 34.5 30.0 - 36.0 g/dL   RDW 26.9 48.5 - 46.2 %   Platelets 225 150 - 400 K/uL   nRBC 0.0 0.0 - 0.2 %  Urinalysis, Complete w Microscopic  Result Value Ref Range   Color, Urine YELLOW (A) YELLOW   APPearance CLEAR (A) CLEAR   Specific Gravity, Urine 1.016 1.005 - 1.030   pH 6.0 5.0 - 8.0   Glucose, UA NEGATIVE NEGATIVE mg/dL   Hgb urine dipstick SMALL (A) NEGATIVE   Bilirubin Urine NEGATIVE NEGATIVE   Ketones, ur NEGATIVE NEGATIVE mg/dL   Protein, ur NEGATIVE NEGATIVE mg/dL   Nitrite NEGATIVE NEGATIVE   Leukocytes,Ua NEGATIVE NEGATIVE   RBC / HPF 0-5 0 - 5 RBC/hpf   WBC, UA 0-5 0 - 5 WBC/hpf   Bacteria, UA NONE SEEN NONE SEEN   Squamous Epithelial / LPF NONE SEEN 0 - 5   Mucus PRESENT  Assessment & Plan:   Problem List Items Addressed This Visit      Digestive   Gastroesophageal reflux disease - Primary    Has  previously been stable on omeprazole 20mg  daily, believes this is not appropriately controlling her GERD, sometimes has globus type sensation in throat.  Discussed returning to GI due to need for possible endoscopy, patient prefers to try omeprazole 20mg  BID and re-evaluate in 4 weeks.  Plan: 1. Begin omeprazole 20mg  BID WC 2. It is encouraged re-evaluation with GI for globus sensation 3. RTC in 4 weeks      Relevant Medications   omeprazole (PRILOSEC) 20 MG capsule     Other   Anxiety    PHQ9-6/GAD7-12.  Reports has had an increase in anxiety with her employment, which has prompted her to find a new employer.  Reports intermittent chest discomfort with increased anxiety.  Has been taking buspar 20mg  BID and likely will benefit from low dose SSRI daily.  Will begin on citalopram 10mg  daily, continue buspar, and RTC in 4 weeks.  Plan: 1. Begin citalopram 10mg  daily 2. Continue buspar 20mg  BID 3. Mood handout to review 4. RTC in 4 weeks, sooner, if needed      Relevant Medications   citalopram (CELEXA) 10 MG tablet   busPIRone (BUSPAR) 10 MG tablet      Meds ordered this encounter  Medications  . omeprazole (PRILOSEC) 20 MG capsule    Sig: Take 1 capsule (20 mg total) by mouth 2 (two) times daily before a meal.    Dispense:  180 capsule    Refill:  1  . citalopram (CELEXA) 10 MG tablet    Sig: Take 1 tablet (10 mg total) by mouth daily.    Dispense:  90 tablet    Refill:  1  . busPIRone (BUSPAR) 10 MG tablet    Sig: Take 2 tablets (20 mg total) by mouth 2 (two) times daily.    Dispense:  360 tablet    Refill:  1    Follow up plan: No follow-ups on file.   , FNP Family Nurse Practitioner [facility] 03/25/2020, 4:17 PM

## 2020-04-29 ENCOUNTER — Other Ambulatory Visit: Payer: 59

## 2020-04-29 DIAGNOSIS — Z20822 Contact with and (suspected) exposure to covid-19: Secondary | ICD-10-CM

## 2020-04-30 LAB — SARS-COV-2, NAA 2 DAY TAT

## 2020-04-30 LAB — NOVEL CORONAVIRUS, NAA: SARS-CoV-2, NAA: NOT DETECTED

## 2020-05-16 ENCOUNTER — Emergency Department
Admission: EM | Admit: 2020-05-16 | Discharge: 2020-05-16 | Disposition: A | Discharge status: Home / Self Care | Payer: 59 | Attending: Emergency Medicine | Admitting: Emergency Medicine

## 2020-05-16 ENCOUNTER — Other Ambulatory Visit: Payer: Self-pay

## 2020-05-16 ENCOUNTER — Emergency Department: Payer: 59

## 2020-05-16 ENCOUNTER — Encounter: Payer: Self-pay | Admitting: *Deleted

## 2020-05-16 DIAGNOSIS — Z91040 Latex allergy status: Secondary | ICD-10-CM | POA: Insufficient documentation

## 2020-05-16 DIAGNOSIS — M25561 Pain in right knee: Secondary | ICD-10-CM | POA: Insufficient documentation

## 2020-05-16 DIAGNOSIS — Y92410 Unspecified street and highway as the place of occurrence of the external cause: Secondary | ICD-10-CM | POA: Diagnosis not present

## 2020-05-16 DIAGNOSIS — Z79899 Other long term (current) drug therapy: Secondary | ICD-10-CM | POA: Insufficient documentation

## 2020-05-16 DIAGNOSIS — Z87891 Personal history of nicotine dependence: Secondary | ICD-10-CM | POA: Insufficient documentation

## 2020-05-16 DIAGNOSIS — M545 Low back pain, unspecified: Secondary | ICD-10-CM | POA: Diagnosis not present

## 2020-05-16 DIAGNOSIS — M7918 Myalgia, other site: Secondary | ICD-10-CM

## 2020-05-16 LAB — POC URINE PREG, ED: Preg Test, Ur: NEGATIVE

## 2020-05-16 MED ORDER — HYDROCODONE-ACETAMINOPHEN 5-325 MG PO TABS
1.0000 | ORAL_TABLET | Freq: Once | ORAL | Status: AC
  Administered 2020-05-16: 1 via ORAL
  Filled 2020-05-16: qty 1

## 2020-05-16 MED ORDER — CYCLOBENZAPRINE HCL 10 MG PO TABS
10.0000 mg | ORAL_TABLET | Freq: Once | ORAL | Status: AC
  Administered 2020-05-16: 10 mg via ORAL
  Filled 2020-05-16: qty 1

## 2020-05-16 MED ORDER — KETOROLAC TROMETHAMINE 30 MG/ML IJ SOLN
30.0000 mg | Freq: Once | INTRAMUSCULAR | Status: AC
  Administered 2020-05-16: 30 mg via INTRAMUSCULAR
  Filled 2020-05-16: qty 1

## 2020-05-16 MED ORDER — CYCLOBENZAPRINE HCL 5 MG PO TABS: 5.0000 mg | ORAL_TABLET | Freq: Three times a day (TID) | ORAL | 0 refills | Status: DC | PRN

## 2020-05-16 MED ORDER — HYDROCODONE-ACETAMINOPHEN 5-325 MG PO TABS: 1.0000 | ORAL_TABLET | Freq: Once | ORAL | Status: DC

## 2020-05-16 MED ORDER — HYDROCODONE-ACETAMINOPHEN 5-325 MG PO TABS: 1.0000 | ORAL_TABLET | Freq: Three times a day (TID) | ORAL | 0 refills | Status: AC | PRN

## 2020-05-16 MED ORDER — KETOROLAC TROMETHAMINE 10 MG PO TABS: 10.0000 mg | ORAL_TABLET | Freq: Three times a day (TID) | ORAL | 0 refills | Status: DC
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
## 2020-05-17 ENCOUNTER — Other Ambulatory Visit: Payer: Self-pay | Admitting: Family Medicine

## 2020-05-17 DIAGNOSIS — Z3040 Encounter for surveillance of contraceptives, unspecified: Secondary | ICD-10-CM

## 2020-05-17 MED ORDER — CRYSELLE-28 0.3-30 MG-MCG PO TABS: 1.0000 | ORAL_TABLET | Freq: Every day | ORAL | 0 refills | Status: DC

## 2020-05-17 NOTE — Telephone Encounter (Signed)
Copied from CRM [id number]. Topic: Quick Communication - Rx Refill/Question >> May 17, 2020  9:26 AM Jaquita Rector A wrote: Medication: CRYSELLE-28 0.3-30 MG-MCG tablet  Completely out please send Rx today   Has the patient contacted their pharmacy? Yes.   (Agent: If no, request that the patient contact the pharmacy for the refill.) (Agent: If yes, when and what did the pharmacy advise?)  Preferred Pharmacy (with phone number or street name): [id number] - [place], [place] - [address]  Phone:  [phone] Fax:  [phone]     Agent: Please be advised that RX refills may take up to 3 business days. We ask that you follow-up with your pharmacy.

## 2020-05-24 ENCOUNTER — Ambulatory Visit (INDEPENDENT_AMBULATORY_CARE_PROVIDER_SITE_OTHER): Payer: 59 | Admitting: Family Medicine

## 2020-05-24 ENCOUNTER — Encounter: Payer: Self-pay | Admitting: Family Medicine

## 2020-05-24 ENCOUNTER — Other Ambulatory Visit: Payer: Self-pay

## 2020-05-24 VITALS — BP 113/82 | HR 101 | Ht 62.0 in | Wt 185.2 lb

## 2020-05-24 DIAGNOSIS — M25561 Pain in right knee: Secondary | ICD-10-CM

## 2020-05-24 DIAGNOSIS — G4700 Insomnia, unspecified: Secondary | ICD-10-CM | POA: Diagnosis not present

## 2020-05-24 MED ORDER — HYDROXYZINE HCL 25 MG PO TABS: 25.0000 mg | ORAL_TABLET | Freq: Three times a day (TID) | ORAL | 0 refills | Status: DC | PRN

## 2020-05-24 MED ORDER — CYCLOBENZAPRINE HCL 5 MG PO TABS: 5.0000 mg | ORAL_TABLET | Freq: Three times a day (TID) | ORAL | 0 refills | Status: DC | PRN

## 2020-05-24 NOTE — Patient Instructions (Signed)
I have sent in a refill on the cyclobenzaprine to your pharmacy on file to take 1 tablet up to 3x per day as needed.  I have sent in a prescription for hydroxyzine 25mg  to take 1 tablet at bedtime, as needed for insomnia.  A referral to Orthopedics has been placed today.  If you have not heard from the specialty office or our referral coordinator within 1 week, please let know and we will follow up with the referral coordinator for an update.  We will plan to see you back if your symptoms worsen or fail to improve  You will receive a survey after today's visit either digitally by e-mail or paper by USPS mail. Your experiences and feedback matter to [place].  Please respond so we know how we are doing as we provide care for you.  Call us with any questions/concerns/needs.  It is my goal to be available to you for your health concerns.  Thanks for choosing me to be a partner in your healthcare needs!  [place], FNP-C Family Nurse Practitioner [facility] Phone: [phone]
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
## 2020-05-25 NOTE — Assessment & Plan Note (Signed)
Acute pain of the right knee, has not improved since injury (05/16/2020).  Loss of ROM due to pain and painful ambulation.  Discussed physical therapy and management by orthopedics, as is going to be billed through a 3rd party insurance, and we do not take that in clinic.  Patient agreeable to physical therapy and proceeding to Orthopedics for evaluation.  Provided with local orthopedic contact information.  Patient will schedule an appt.

## 2020-05-26 ENCOUNTER — Encounter: Payer: Self-pay | Admitting: Family Medicine

## 2020-05-30 ENCOUNTER — Encounter: Payer: Self-pay | Admitting: Family Medicine

## 2020-07-22 ENCOUNTER — Other Ambulatory Visit (HOSPITAL_COMMUNITY): Payer: Self-pay | Admitting: Orthopedic Surgery

## 2020-07-22 ENCOUNTER — Other Ambulatory Visit: Payer: Self-pay | Admitting: Orthopedic Surgery

## 2020-07-22 DIAGNOSIS — S7421XD Injury of cutaneous sensory nerve at hip and high level, right leg, subsequent encounter: Secondary | ICD-10-CM

## 2020-07-22 DIAGNOSIS — M5441 Lumbago with sciatica, right side: Secondary | ICD-10-CM

## 2020-07-22 DIAGNOSIS — G8929 Other chronic pain: Secondary | ICD-10-CM

## 2020-07-30 ENCOUNTER — Other Ambulatory Visit: Payer: Self-pay

## 2020-07-30 ENCOUNTER — Ambulatory Visit
Admission: RE | Admit: 2020-07-30 | Discharge: 2020-07-30 | Disposition: A | Discharge status: Home / Self Care | Payer: 59 | Source: Ambulatory Visit | Attending: Orthopedic Surgery | Admitting: Orthopedic Surgery

## 2020-07-30 DIAGNOSIS — S7421XD Injury of cutaneous sensory nerve at hip and high level, right leg, subsequent encounter: Secondary | ICD-10-CM | POA: Diagnosis present

## 2020-07-30 DIAGNOSIS — G8929 Other chronic pain: Secondary | ICD-10-CM

## 2020-07-30 DIAGNOSIS — M5441 Lumbago with sciatica, right side: Secondary | ICD-10-CM | POA: Insufficient documentation

## 2020-08-11 ENCOUNTER — Other Ambulatory Visit: Payer: Self-pay

## 2020-08-11 DIAGNOSIS — Z3040 Encounter for surveillance of contraceptives, unspecified: Secondary | ICD-10-CM

## 2020-08-11 MED ORDER — CRYSELLE-28 0.3-30 MG-MCG PO TABS: 1.0000 | ORAL_TABLET | Freq: Every day | ORAL | 0 refills | Status: DC

## 2020-09-05 ENCOUNTER — Other Ambulatory Visit: Payer: Self-pay | Admitting: Family Medicine

## 2020-09-05 DIAGNOSIS — Z3040 Encounter for surveillance of contraceptives, unspecified: Secondary | ICD-10-CM

## 2020-10-02 ENCOUNTER — Other Ambulatory Visit: Payer: Self-pay

## 2020-10-02 DIAGNOSIS — Z3040 Encounter for surveillance of contraceptives, unspecified: Secondary | ICD-10-CM

## 2020-10-03 MED ORDER — CRYSELLE-28 0.3-30 MG-MCG PO TABS: 1.0000 | ORAL_TABLET | Freq: Every day | ORAL | 0 refills | Status: DC

## 2020-12-05 ENCOUNTER — Ambulatory Visit (INDEPENDENT_AMBULATORY_CARE_PROVIDER_SITE_OTHER): Payer: 59 | Admitting: Internal Medicine

## 2020-12-05 ENCOUNTER — Encounter: Payer: Self-pay | Admitting: Internal Medicine

## 2020-12-05 ENCOUNTER — Other Ambulatory Visit: Payer: Self-pay

## 2020-12-05 VITALS — BP 129/89 | HR 69 | Temp 97.2°F | Resp 17 | Ht 62.0 in | Wt 172.4 lb

## 2020-12-05 DIAGNOSIS — R109 Unspecified abdominal pain: Secondary | ICD-10-CM | POA: Diagnosis not present

## 2020-12-05 DIAGNOSIS — Z3040 Encounter for surveillance of contraceptives, unspecified: Secondary | ICD-10-CM

## 2020-12-05 DIAGNOSIS — G8929 Other chronic pain: Secondary | ICD-10-CM

## 2020-12-05 DIAGNOSIS — F419 Anxiety disorder, unspecified: Secondary | ICD-10-CM | POA: Diagnosis not present

## 2020-12-05 DIAGNOSIS — R21 Rash and other nonspecific skin eruption: Secondary | ICD-10-CM | POA: Diagnosis not present

## 2020-12-05 DIAGNOSIS — K219 Gastro-esophageal reflux disease without esophagitis: Secondary | ICD-10-CM

## 2020-12-05 DIAGNOSIS — F5101 Primary insomnia: Secondary | ICD-10-CM

## 2020-12-05 DIAGNOSIS — M5441 Lumbago with sciatica, right side: Secondary | ICD-10-CM

## 2020-12-05 MED ORDER — BUSPIRONE HCL 10 MG PO TABS: 20.0000 mg | ORAL_TABLET | Freq: Two times a day (BID) | ORAL | 5 refills | Status: DC

## 2020-12-05 MED ORDER — CRYSELLE-28 0.3-30 MG-MCG PO TABS: 1.0000 | ORAL_TABLET | Freq: Every day | ORAL | 5 refills | Status: DC

## 2020-12-05 MED ORDER — OMEPRAZOLE 20 MG PO CPDR: 20.0000 mg | DELAYED_RELEASE_CAPSULE | Freq: Two times a day (BID) | ORAL | 5 refills | Status: DC

## 2020-12-05 MED ORDER — DICYCLOMINE HCL 20 MG PO TABS: 20.0000 mg | ORAL_TABLET | Freq: Three times a day (TID) | ORAL | 5 refills | Status: DC | PRN

## 2020-12-05 NOTE — Assessment & Plan Note (Signed)
Continue Bentyl, refilled today

## 2020-12-05 NOTE — Assessment & Plan Note (Signed)
Continue Melatonin for now Will monitor

## 2020-12-05 NOTE — Assessment & Plan Note (Signed)
Continue Gabapentin Working with PT Has appt with neurology tomorrow

## 2020-12-05 NOTE — Patient Instructions (Signed)

## 2020-12-05 NOTE — Assessment & Plan Note (Signed)
Stable on current dose of Buspar, refilled today Support offered

## 2020-12-05 NOTE — Assessment & Plan Note (Signed)
Avoid foods that trigger your reflux Encouraged weight loss as this can help reduce reflux symptoms Omeprazole refilled today CBC and CMET today

## 2020-12-05 NOTE — Progress Notes (Signed)
Subjective:    Patient ID: Cindy Bonilla, female    DOB: [birth year], 40 y.o.   MRN: [id number]  HPI  Patient presents the clinic today for follow-up of chronic conditions.  She is establishing care with me today, transferring care from Malva Cogan, NP.  Chronic Cramping: Managed on Bentyl.  Colonoscopy from 10/2019 reviewed.  She does not follow with GI.  GERD: Triggered by spicy foods, tomato based.  She denies breakthrough on Omeprazole.  Upper GI from 10/2019 reviewed.  Insomnia: She has difficulty falling asleep and staying asleep.  She does take Melatonin OTC with minimal results.  There is no sleep study on file.  Anxiety: Chronic, managed on BuSpar. She is not longer taking Citalopram and Hydroxyzine. She is not currently seeing a therapist.  She denies depression, SI/HI.  Chronic Low Back Pain with Right Sided Sciatica: s/p back injections. MRI lumbar spine from 07/2020 reviewed. She has seen orthopedic and PT. She takes Gabapentin as prescribed. She has an appt with Neurology tomorrow.    Review of Systems   Past Medical History:  Diagnosis Date   Anxiety    Arthritis    Depression    Urinary urgency     Current Outpatient Medications  Medication Sig Dispense Refill   busPIRone (BUSPAR) 10 MG tablet Take 2 tablets (20 mg total) by mouth 2 (two) times daily. 360 tablet 1   citalopram (CELEXA) 10 MG tablet Take 1 tablet (10 mg total) by mouth daily. 90 tablet 1   cyclobenzaprine (FLEXERIL) 5 MG tablet Take 1 tablet (5 mg total) by mouth 3 (three) times daily as needed. 15 tablet 0   dicyclomine (BENTYL) 20 MG tablet Take 1 tablet (20 mg total) by mouth 3 (three) times daily as needed for spasms. 21 tablet 0   hydrOXYzine (ATARAX/VISTARIL) 25 MG tablet Take 1 tablet (25 mg total) by mouth 3 (three) times daily as needed. 30 tablet 0   norgestrel-ethinyl estradiol (CRYSELLE-28) 0.3-30 MG-MCG tablet Take 1 tablet by mouth daily. 28 tablet 0   omeprazole (PRILOSEC) 20 MG  capsule Take 1 capsule (20 mg total) by mouth 2 (two) times daily before a meal. 180 capsule 1   No current facility-administered medications for this visit.    Allergies  Allergen Reactions   Amoxicillin    Motrin [Ibuprofen] Other (See Comments)    Rectal bleeding.   Latex Rash    Family History  Problem Relation Age of Onset   Diabetes Mother    Depression Mother    Aneurysm Brother    Other Father        "some rare disease"   Bladder Cancer Neg Hx    Prostate cancer Neg Hx    Kidney cancer Neg Hx     Social History   Socioeconomic History   Marital status: Significant Other    Spouse name: Not on file   Number of children: Not on file   Years of education: Not on file   Highest education level: Not on file  Occupational History   Not on file  Tobacco Use   Smoking status: Former    Pack years: 0.00    Types: Cigarettes    Quit date: 09/15/2004    Years since quitting: 16.2   Smokeless tobacco: Never  Vaping Use   Vaping Use: Some days   Substances: Flavoring  Substance and Sexual Activity   Alcohol use: Not Currently    Alcohol/week: 0.0 standard drinks    Comment: occasional  Drug use: No   Sexual activity: Not on file  Other Topics Concern   Not on file  Social History Narrative   Not on file   Social Determinants of Health   Financial Resource Strain: Not on file  Food Insecurity: Not on file  Transportation Needs: Not on file  Physical Activity: Not on file  Stress: Not on file  Social Connections: Not on file  Intimate Partner Violence: Not on file     Constitutional: Denies fever, malaise, fatigue, headache or abrupt weight changes.  HEENT: Denies eye pain, eye redness, ear pain, ringing in the ears, wax buildup, runny nose, nasal congestion, bloody nose, or sore throat. Respiratory: Denies difficulty breathing, shortness of breath, cough or sputum production.   Cardiovascular: Denies chest pain, chest tightness, palpitations or  swelling in the hands or feet.  Gastrointestinal: Patient reports chronic cramping.  Denies abdominal pain, bloating, constipation, diarrhea or blood in the stool.  GU: Denies urgency, frequency, pain with urination, burning sensation, blood in urine, odor or discharge. Musculoskeletal: Pt reports low back pain with right sided sciatica. Denies decrease in range of motion, difficulty with gait, or joint  swelling.  Skin: Pt reports rash to left thigh. Denies lesions or ulcercations.  Neurological: Patient reports insomnia.  Denies dizziness, difficulty with memory, difficulty with speech or problems with balance and coordination.  Psych: Patient reports anxiety.  Denies depression, SI/HI.  No other specific complaints in a complete review of systems (except as listed in HPI above).   Objective:   Physical Exam  BP 129/89 (BP Location: Left Arm, Patient Position: Sitting, Cuff Size: Normal)   Pulse 69   Temp (!) 97.2 F (36.2 C) (Temporal)   Resp 17   Ht 5\' 2"  (1.575 m)   Wt 172 lb 6.4 oz (78.2 kg)   SpO2 100%   BMI 31.53 kg/m   Wt Readings from Last 3 Encounters:  05/24/20 185 lb 3.2 oz (84 kg)  05/16/20 185 lb (83.9 kg)  03/25/20 183 lb (83 kg)    General: Appears her stated age, obese, in NAD. Skin: Linear streak to left medial thigh, c/w excoriation. HEENT: Head: normal shape and size; Eyes: sclera white and EOM's intact;  Cardiovascular: Normal rate and rhythm. S1,S2 noted.  No murmur, rubs or gallops noted. No JVD or BLE edema.  Pulmonary/Chest: Normal effort and positive vesicular breath sounds. No respiratory distress. No wheezes, rales or ronchi noted.  Abdomen: Soft and nontender. Normal bowel sounds. No distention or masses noted.  Musculoskeletal: Strength 4/5 RLE, 5/5 LLE. No difficulty with gait.  Neurological: Alert and oriented.  Psychiatric: Mood and affect normal. Behavior is normal. Judgment and thought content normal.     BMET    Component Value  Date/Time   NA 137 11/18/2019 1517   NA 142 10/18/2015 1047   K 4.1 11/18/2019 1517   CL 103 11/18/2019 1517   CO2 26 11/18/2019 1517   GLUCOSE 107 (H) 11/18/2019 1517   BUN 8 11/18/2019 1517   BUN 6 10/18/2015 1047   CREATININE 1.03 (H) 11/18/2019 1517   CREATININE 0.89 09/29/2019 0850   CALCIUM 8.9 11/18/2019 1517   GFRNONAA >60 11/18/2019 1517   GFRNONAA 81 09/29/2019 0850   GFRAA >60 11/18/2019 1517   GFRAA 94 09/29/2019 0850    Lipid Panel     Component Value Date/Time   CHOL 168 09/04/2019 1047   CHOL 156 10/18/2015 1047   TRIG 91 09/04/2019 1047   HDL 55  09/04/2019 1047   HDL 48 10/18/2015 1047   CHOLHDL 3.1 09/04/2019 1047   LDLCALC 94 09/04/2019 1047    CBC    Component Value Date/Time   WBC 6.1 11/18/2019 1517   RBC 4.40 11/18/2019 1517   HGB 13.8 11/18/2019 1517   HCT 40.0 11/18/2019 1517   PLT 225 11/18/2019 1517   MCV 90.9 11/18/2019 1517   MCH 31.4 11/18/2019 1517   MCHC 34.5 11/18/2019 1517   RDW 11.8 11/18/2019 1517   LYMPHSABS 2,760 09/29/2019 0850   EOSABS 218 09/29/2019 0850   BASOSABS 30 09/29/2019 0850    Hgb A1C Lab Results  Component Value Date   HGBA1C 5.3 09/04/2019            Assessment & Plan:   Rash to Left Thigh:  Appears to be excoriation but she denies scratching the arean It itches, advised  her to try Hydrocortisone OTC as needed  RTC in 6 months for your annual exam Nicki Reaper, NP This visit occurred during the SARS-CoV-2 public health emergency.  Safety protocols were in place, including screening questions prior to the visit, additional usage of staff PPE, and extensive cleaning of exam room while observing appropriate contact time as indicated for disinfecting solutions.

## 2020-12-06 LAB — COMPLETE METABOLIC PANEL WITH GFR
AG Ratio: 1.7 (calc) (ref 1.0–2.5)
ALT: 15 U/L (ref 6–29)
AST: 18 U/L (ref 10–30)
Albumin: 4 g/dL (ref 3.6–5.1)
Alkaline phosphatase (APISO): 44 U/L (ref 31–125)
BUN: 13 mg/dL (ref 7–25)
CO2: 27 mmol/L (ref 20–32)
Calcium: 9.2 mg/dL (ref 8.6–10.2)
Chloride: 101 mmol/L (ref 98–110)
Creat: 1 mg/dL (ref 0.50–1.10)
GFR, Est African American: 81 mL/min/{1.73_m2} (ref >=60)
GFR, Est Non African American: 70 mL/min/{1.73_m2} (ref >=60)
Globulin: 2.4 g/dL (calc) (ref 1.9–3.7)
Glucose, Bld: 100 mg/dL — ABNORMAL HIGH (ref 65–99)
Potassium: 4.1 mmol/L (ref 3.5–5.3)
Sodium: 137 mmol/L (ref 135–146)
Total Bilirubin: 0.5 mg/dL (ref 0.2–1.2)
Total Protein: 6.4 g/dL (ref 6.1–8.1)

## 2020-12-06 LAB — CBC
HCT: 39.6 % (ref 35.0–45.0)
Hemoglobin: 13.5 g/dL (ref 11.7–15.5)
MCH: 32.4 pg (ref 27.0–33.0)
MCHC: 34.1 g/dL (ref 32.0–36.0)
MCV: 95 fL (ref 80.0–100.0)
MPV: 10 fL (ref 7.5–12.5)
Platelets: 231 10*3/uL (ref 140–400)
RBC: 4.17 10*6/uL (ref 3.80–5.10)
RDW: 11.8 % (ref 11.0–15.0)
WBC: 6.6 10*3/uL (ref 3.8–10.8)

## 2021-01-30 ENCOUNTER — Other Ambulatory Visit: Payer: Self-pay | Admitting: Orthopedic Surgery

## 2021-01-30 DIAGNOSIS — M5126 Other intervertebral disc displacement, lumbar region: Secondary | ICD-10-CM

## 2021-01-30 DIAGNOSIS — G8929 Other chronic pain: Secondary | ICD-10-CM

## 2021-01-30 DIAGNOSIS — M5136 Other intervertebral disc degeneration, lumbar region: Secondary | ICD-10-CM

## 2021-02-06 ENCOUNTER — Ambulatory Visit
Admission: RE | Admit: 2021-02-06 | Discharge: 2021-02-06 | Disposition: A | Discharge status: Home / Self Care | Payer: 59 | Source: Ambulatory Visit | Attending: Orthopedic Surgery | Admitting: Orthopedic Surgery

## 2021-02-06 DIAGNOSIS — M5441 Lumbago with sciatica, right side: Secondary | ICD-10-CM

## 2021-02-06 DIAGNOSIS — G8929 Other chronic pain: Secondary | ICD-10-CM

## 2021-02-06 DIAGNOSIS — M5126 Other intervertebral disc displacement, lumbar region: Secondary | ICD-10-CM

## 2021-02-06 DIAGNOSIS — M5136 Other intervertebral disc degeneration, lumbar region: Secondary | ICD-10-CM

## 2021-02-06 MED ORDER — IOPAMIDOL (ISOVUE-M 200) INJECTION 41%
1.0000 mL | Freq: Once | INTRAMUSCULAR | Status: AC
  Administered 2021-02-06: 1 mL via EPIDURAL

## 2021-02-06 MED ORDER — METHYLPREDNISOLONE ACETATE 40 MG/ML INJ SUSP (RADIOLOG
80.0000 mg | Freq: Once | INTRAMUSCULAR | Status: AC
  Administered 2021-02-06: 80 mg via EPIDURAL

## 2021-02-06 NOTE — Discharge Instructions (Signed)

## 2021-04-11 ENCOUNTER — Other Ambulatory Visit: Payer: Self-pay

## 2021-04-11 ENCOUNTER — Encounter: Payer: Self-pay | Admitting: Internal Medicine

## 2021-04-11 ENCOUNTER — Ambulatory Visit (INDEPENDENT_AMBULATORY_CARE_PROVIDER_SITE_OTHER): Payer: 59 | Admitting: Internal Medicine

## 2021-04-11 VITALS — BP 117/84 | HR 80 | Temp 97.7°F | Resp 17 | Ht 62.0 in | Wt 162.2 lb

## 2021-04-11 DIAGNOSIS — E663 Overweight: Secondary | ICD-10-CM

## 2021-04-11 DIAGNOSIS — Z6829 Body mass index (BMI) 29.0-29.9, adult: Secondary | ICD-10-CM

## 2021-04-11 DIAGNOSIS — Z0001 Encounter for general adult medical examination with abnormal findings: Secondary | ICD-10-CM

## 2021-04-11 DIAGNOSIS — Z1159 Encounter for screening for other viral diseases: Secondary | ICD-10-CM

## 2021-04-11 DIAGNOSIS — Z6828 Body mass index (BMI) 28.0-28.9, adult: Secondary | ICD-10-CM | POA: Insufficient documentation

## 2021-04-11 NOTE — Assessment & Plan Note (Signed)
Encourage diet and exercise for weight loss 

## 2021-04-11 NOTE — Progress Notes (Signed)
Subjective:    Patient ID: Cindy Bonilla, female    DOB: Jan 17, 1981, 40 y.o.   MRN: [id number]  HPI  Pt presents to the clinic today for her annual exam.  Flu: never Tetanus: 09/2017 Covid: Moderna x 2 Pap Smear: 09/2019 Mammogram: never Vision Screening: as needed Dentist: biannually  Diet: She does eat meat. She consumes fruits and veggies. She does eat some fried foods. She drinks mostly water.  Exercise: Cardio  Review of Systems     Past Medical History:  Diagnosis Date   Anxiety    Arthritis    Depression    Urinary urgency     Current Outpatient Medications  Medication Sig Dispense Refill   busPIRone (BUSPAR) 10 MG tablet Take 2 tablets (20 mg total) by mouth 2 (two) times daily. 120 tablet 5   dicyclomine (BENTYL) 20 MG tablet Take 1 tablet (20 mg total) by mouth 3 (three) times daily as needed for spasms. 90 tablet 5   norgestrel-ethinyl estradiol (CRYSELLE-28) 0.3-30 MG-MCG tablet Take 1 tablet by mouth daily. 28 tablet 5   omeprazole (PRILOSEC) 20 MG capsule Take 1 capsule (20 mg total) by mouth 2 (two) times daily before a meal. 60 capsule 5   No current facility-administered medications for this visit.    Allergies  Allergen Reactions   Amoxicillin    Motrin [Ibuprofen] Other (See Comments)    Rectal bleeding.   Latex Rash and Hives    Family History  Problem Relation Age of Onset   Diabetes Mother    Depression Mother    Aneurysm Brother    Other Father        "some rare disease"   Bladder Cancer Neg Hx    Prostate cancer Neg Hx    Kidney cancer Neg Hx     Social History   Socioeconomic History   Marital status: Significant Other    Spouse name: Not on file   Number of children: Not on file   Years of education: Not on file   Highest education level: Not on file  Occupational History   Not on file  Tobacco Use   Smoking status: Former    Types: Cigarettes    Quit date: 09/15/2004    Years since quitting: 16.5   Smokeless  tobacco: Never  Vaping Use   Vaping Use: Former   Substances: Flavoring  Substance and Sexual Activity   Alcohol use: Yes    Comment: occasional   Drug use: No   Sexual activity: Not on file  Other Topics Concern   Not on file  Social History Narrative   Not on file   Social Determinants of Health   Financial Resource Strain: Not on file  Food Insecurity: Not on file  Transportation Needs: Not on file  Physical Activity: Not on file  Stress: Not on file  Social Connections: Not on file  Intimate Partner Violence: Not on file     Constitutional: Denies fever, malaise, fatigue, headache or abrupt weight changes.  HEENT: Denies eye pain, eye redness, ear pain, ringing in the ears, wax buildup, runny nose, nasal congestion, bloody nose, or sore throat. Respiratory: Denies difficulty breathing, shortness of breath, cough or sputum production.   Cardiovascular: Denies chest pain, chest tightness, palpitations or swelling in the hands or feet.  Gastrointestinal: Pt reports reflux. Denies abdominal pain, bloating, constipation, diarrhea or blood in the stool.  GU: Denies urgency, frequency, pain with urination, burning sensation, blood in urine, odor or discharge.  Musculoskeletal: Pt reports chronic joint pain. Denies decrease in range of motion, difficulty with gait, or joint swelling.  Skin: Denies redness, rashes, lesions or ulcercations.  Neurological: Pt reports insomnia. Denies dizziness, difficulty with memory, difficulty with speech or problems with balance and coordination.  Psych: Pt has a history of anxiety. Denies depression, SI/HI.  No other specific complaints in a complete review of systems (except as listed in HPI above).  Objective:   Physical Exam   BP 117/84 (BP Location: Left Arm, Patient Position: Sitting, Cuff Size: Normal)   Pulse 80   Temp 97.7 F (36.5 C) (Temporal)   Resp 17   Ht 5\' 2"  (1.575 m)   Wt 162 lb 3.2 oz (73.6 kg)   SpO2 100%   BMI 29.67  kg/m   Wt Readings from Last 3 Encounters:  12/05/20 172 lb 6.4 oz (78.2 kg)  05/24/20 185 lb 3.2 oz (84 kg)  05/16/20 185 lb (83.9 kg)    General: Appears her stated age, overweight, in NAD. Skin: Warm, dry and intact.  HEENT: Head: normal shape and size; Eyes: EOMs intact;  Neck:  Neck supple, trachea midline. No masses, lumps or thyromegaly present.  Cardiovascular: Normal rate and rhythm. S1,S2 noted.  No murmur, rubs or gallops noted. No JVD or BLE edema. No carotid bruits noted. Pulmonary/Chest: Normal effort and positive vesicular breath sounds. No respiratory distress. No wheezes, rales or ronchi noted.  Abdomen: Soft and nontender. Normal bowel sounds. No distention or masses noted. Liver, spleen and kidneys non palpable. Musculoskeletal: Strength 4/5 BUE/BLE.  No difficulty with gait.  Neurological: Alert and oriented. Cranial nerves II-XII grossly intact. Coordination normal.  Psychiatric: Mood and affect normal. Behavior is normal. Judgment and thought content normal.    BMET    Component Value Date/Time   NA 137 12/05/2020 0955   NA 142 10/18/2015 1047   K 4.1 12/05/2020 0955   CL 101 12/05/2020 0955   CO2 27 12/05/2020 0955   GLUCOSE 100 (H) 12/05/2020 0955   BUN 13 12/05/2020 0955   BUN 6 10/18/2015 1047   CREATININE 1.00 12/05/2020 0955   CALCIUM 9.2 12/05/2020 0955   GFRNONAA 70 12/05/2020 0955   GFRAA 81 12/05/2020 0955    Lipid Panel     Component Value Date/Time   CHOL 168 09/04/2019 1047   CHOL 156 10/18/2015 1047   TRIG 91 09/04/2019 1047   HDL 55 09/04/2019 1047   HDL 48 10/18/2015 1047   CHOLHDL 3.1 09/04/2019 1047   LDLCALC 94 09/04/2019 1047    CBC    Component Value Date/Time   WBC 6.6 12/05/2020 0955   RBC 4.17 12/05/2020 0955   HGB 13.5 12/05/2020 0955   HCT 39.6 12/05/2020 0955   PLT 231 12/05/2020 0955   MCV 95.0 12/05/2020 0955   MCH 32.4 12/05/2020 0955   MCHC 34.1 12/05/2020 0955   RDW 11.8 12/05/2020 0955   LYMPHSABS  2,760 09/29/2019 0850   EOSABS 218 09/29/2019 0850   BASOSABS 30 09/29/2019 0850    Hgb A1C Lab Results  Component Value Date   HGBA1C 5.3 09/04/2019          Assessment & Plan:   Preventative Health Maintenance:  She declines flu shot Tetanus UTD Encouraged her to get her covid booster Pap smear UTD Will start screening mammogram at 43 Encouraged her to consume a balanced diet and exercise regimen Advised her to see an eye doctor and dentist annually Will check CBC, CMET, Lipid,  A1C and Hep C today  RTC in 1 year, sooner if needed Nicki Reaper, NP This visit occurred during the SARS-CoV-2 public health emergency.  Safety protocols were in place, including screening questions prior to the visit, additional usage of staff PPE, and extensive cleaning of exam room while observing appropriate contact time as indicated for disinfecting solutions.

## 2021-04-11 NOTE — Patient Instructions (Signed)
Caring for Your Mental Health Mental health is emotional, psychological, and social well-being. Mental health is just as important as physical health. In fact, mental and physical health are connected, and you need both to be healthy. Some signs of good mental health (well-being) include: Being able to attend to tasks at home, school, or work. Being able to manage stress and emotions. Practicing self-care, which may include: A regular exercise pattern. A reasonably healthy diet. Supportive and trusting relationships. The ability to relax and calm yourself (self-calm). Having pleasurable hobbies and activities to do. Believing that you have meaning and purpose in your life. Recovering and adjusting after facing challenges (resilience). You can take steps to build or strengthen these mentally healthy behaviors. There are resources and support to help you with this. Why is caring for mental health important? Caring for your mental health is a big part of staying healthy. Everyone has times when feelings, thoughts, or situations feel overwhelming. Mental health means having the skills to manage what feels overwhelming. If this sense of being overwhelmed persists, however, you might need some help. If you have some of the following signs, you may need to take better care of your mental health or seek help from a health care provider or mental health professional: Problems with energy or focus. Changes in eating habits. Problems sleeping, such as sleeping too much or not enough. Emotional distress, such as anger, sadness, depression, or anxiety. Major changes in your relationships. Losing interest in life or activities that you used to enjoy. If you have any of these symptoms on most days for 2 weeks or longer: Talk with a close friend or family member about how you are feeling. Contact your health care provider to discuss your symptoms. Consider working with a Financial trader. Your  health care provider, family, or friends may be able to recommend a therapist. What can I do to promote emotional and mental health? Managing emotions Learn to identify emotions and deal with them. Recognizing your emotions is the first step in learning to deal with them. Practice ways to appropriately express feelings. Remember that you can control your feelings. They do not control you. Practice stress management techniques, such as: Relaxation techniques, like breathing or muscle relaxation exercises. Exercise. Regular activity can lower your stress level. Changing what you can change and accepting what you cannot change. Build up your resilience so that you can recover and adjust after big problems or challenges. Practice resilient behaviors and attitudes: Set and focus on long-term goals. Develop and maintain healthy, supportive relationships. Learn to accept change and make the best of the situation. Take care of yourself physically by eating a healthy diet, getting plenty of sleep, and exercising regularly. Develop self-awareness. Ask others to give feedback about how they see you. Practice mindfulness meditation to help you stay calm when dealing with daily challenges. Learn to respond to situations in healthy ways, rather than reacting with your emotions. Keep a positive attitude, and believe in yourself. Your view of yourself affects your mental health. Develop your listening and empathy skills. These will help you deal with difficult situations and communications. Remember that emotions can be used as a good source of communication and are a great source of energy. Try to laugh and find humor in life. Sleeping Get the right amount and quality of sleep. Sleep has a big impact on physical and mental health. To improve your sleep: Go to bed and wake up around the same time every day. Limit  screen time before bedtime. This includes the use of your cell phone, TV, computer, and  tablet. Keep your bedroom dark and cool. Activity  Exercise or do some physical activity regularly. This helps: Keep your body strong, especially during times of stress. Get rid of chemicals in your body (hormones) that build up when you are stressed. Build up your resilience. Eating and drinking  Eat a healthy diet that includes whole grains, vegetables, fresh fruits, and lean proteins. If you have questions about what foods are best for you, ask your health care provider. Try not to turn to sweet, salty, or otherwise unhealthy foods when you are tired or unhappy. This can lead to unwanted weight gain and is not a healthy way to cope with emotions. Where to find more information You can find more information about how to care for your mental health from: The [employer] on Mental Illness (NAMI): www.nami.[employer] of Mental Health: http://www.maynard.net/ Centers for Disease Control and Prevention: https://www.washington.net/ Contact a health care provider if: You lose interest in being with others or you do not want to leave the house. You have a hard time completing your normal activities or you have less energy than normal. You cannot stay focused or you have problems with memory. You feel that your senses are heightened, and this makes you upset or concerned. You feel nervous or have rapid mood changes. You are sleeping or eating more or less than normal. You question reality or you show odd behavior that disturbs you or others. Get help right away if: You have thoughts about hurting yourself or others. If you ever feel like you may hurt yourself or others, or have thoughts about taking your own life, get help right away. You can go to your nearest emergency department or call: Your local emergency services (911 in the [place] A suicide crisis helpline, such as the National Suicide Prevention Lifeline at (657)704-4449. This is open 24 hours a day. Summary Mental health  is not just the absence of mental illness. It involves understanding your emotions and behaviors, and taking steps to cope with them in a healthy way. If you have symptoms of mental or emotional distress, get help from family, friends, a health care provider, or a mental health professional. Practice good mental health behaviors such as stress management skills, self-calming skills, exercise, and healthy sleeping and eating. This information is not intended to replace advice given to you by your health care provider. Make sure you discuss any questions you have with your health care provider. Document Revised: 08/31/2020 Document Reviewed: 12/03/2019 Elsevier Patient Education  2022 [employer].

## 2021-04-12 LAB — COMPLETE METABOLIC PANEL WITH GFR
AG Ratio: 1.7 (calc) (ref 1.0–2.5)
ALT: 12 U/L (ref 6–29)
AST: 13 U/L (ref 10–30)
Albumin: 4 g/dL (ref 3.6–5.1)
Alkaline phosphatase (APISO): 35 U/L (ref 31–125)
BUN: 8 mg/dL (ref 7–25)
CO2: 25 mmol/L (ref 20–32)
Calcium: 9 mg/dL (ref 8.6–10.2)
Chloride: 105 mmol/L (ref 98–110)
Creat: 0.87 mg/dL (ref 0.50–0.99)
Globulin: 2.3 g/dL (calc) (ref 1.9–3.7)
Glucose, Bld: 117 mg/dL (ref 65–139)
Potassium: 4.1 mmol/L (ref 3.5–5.3)
Sodium: 139 mmol/L (ref 135–146)
Total Bilirubin: 0.3 mg/dL (ref 0.2–1.2)
Total Protein: 6.3 g/dL (ref 6.1–8.1)
eGFR: 86 mL/min/{1.73_m2} (ref >=60)

## 2021-04-12 LAB — CBC
HCT: 40.7 % (ref 35.0–45.0)
Hemoglobin: 13.5 g/dL (ref 11.7–15.5)
MCH: 30.8 pg (ref 27.0–33.0)
MCHC: 33.2 g/dL (ref 32.0–36.0)
MCV: 92.9 fL (ref 80.0–100.0)
MPV: 10.8 fL (ref 7.5–12.5)
Platelets: 253 10*3/uL (ref 140–400)
RBC: 4.38 10*6/uL (ref 3.80–5.10)
RDW: 12.1 % (ref 11.0–15.0)
WBC: 7.6 10*3/uL (ref 3.8–10.8)

## 2021-04-12 LAB — LIPID PANEL
Cholesterol: 195 mg/dL (ref <200)
HDL: 64 mg/dL (ref >=50)
LDL Cholesterol (Calc): 109 mg/dL (calc) — ABNORMAL HIGH
Non-HDL Cholesterol (Calc): 131 mg/dL (calc) — ABNORMAL HIGH (ref <130)
Total CHOL/HDL Ratio: 3 (calc) (ref <5.0)
Triglycerides: 110 mg/dL (ref <150)

## 2021-04-12 LAB — HEPATITIS C ANTIBODY
Hepatitis C Ab: NONREACTIVE
SIGNAL TO CUT-OFF: 0.04 (ref <1.00)

## 2021-04-12 LAB — HEMOGLOBIN A1C
Hgb A1c MFr Bld: 5.4 % of total Hgb (ref <5.7)
Mean Plasma Glucose: 108 mg/dL
eAG (mmol/L): 6 mmol/L
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
## 2021-06-13 ENCOUNTER — Encounter: Payer: 59 | Admitting: Internal Medicine

## 2021-06-16 ENCOUNTER — Other Ambulatory Visit: Payer: Self-pay | Admitting: Internal Medicine

## 2021-06-16 DIAGNOSIS — Z3040 Encounter for surveillance of contraceptives, unspecified: Secondary | ICD-10-CM

## 2021-06-16 NOTE — Telephone Encounter (Signed)
Requested Prescriptions  Pending Prescriptions Disp Refills   ELINEST 0.3-30 MG-MCG tablet [Pharmacy Med Name: Elinest 0.3-30 MG-MCG Oral Tablet] 84 tablet 0    Sig: Take 1 tablet by mouth once daily     OB/GYN:  Contraceptives Passed - 06/16/2021  5:04 PM      Passed - Last BP in normal range    BP Readings from Last 1 Encounters:  04/11/21 117/84         Passed - Valid encounter within last 12 months    Recent Outpatient Visits          2 months ago Encounter for general adult medical examination with abnormal findings   [facility] [place], Salvadore Oxford, NP   6 months ago Anxiety   [facility] [place], Salvadore Oxford, NP   1 year ago Acute pain of right knee   [facility], Jodelle Gross, FNP   1 year ago Encounter for Department of Transportation (DOT) examination for trucking license   [facility], Jodelle Gross, FNP   1 year ago Gastroesophageal reflux disease, unspecified whether esophagitis present   [facility], Jodelle Gross, [place]

## 2021-06-20 ENCOUNTER — Other Ambulatory Visit: Payer: Self-pay | Admitting: Internal Medicine

## 2021-06-20 DIAGNOSIS — F419 Anxiety disorder, unspecified: Secondary | ICD-10-CM

## 2021-07-25 ENCOUNTER — Other Ambulatory Visit: Payer: Self-pay | Admitting: Internal Medicine

## 2021-07-25 DIAGNOSIS — F419 Anxiety disorder, unspecified: Secondary | ICD-10-CM

## 2021-07-26 NOTE — Telephone Encounter (Signed)
Requested Prescriptions  Pending Prescriptions Disp Refills   busPIRone (BUSPAR) 10 MG tablet [Pharmacy Med Name: busPIRone HCl 10 MG Oral Tablet] 120 tablet 0    Sig: Take 2 tablets by mouth twice daily     Psychiatry: Anxiolytics/Hypnotics - Non-controlled Passed - 07/25/2021  9:01 PM      Passed - Valid encounter within last 12 months    Recent Outpatient Visits          3 months ago Encounter for general adult medical examination with abnormal findings   [facility] [place], Coralie Keens, NP   7 months ago [facility] [place], Coralie Keens, NP   1 year ago Acute pain of right knee   [facility], Lupita Raider, FNP   1 year ago Encounter for Department of Transportation (DOT) examination for trucking license   [facility], Lupita Raider, FNP   1 year ago Gastroesophageal reflux disease, unspecified whether esophagitis present   [facility], Lupita Raider, [place]

## 2021-07-31 ENCOUNTER — Other Ambulatory Visit: Payer: Self-pay

## 2021-07-31 ENCOUNTER — Ambulatory Visit: Payer: Managed Care, Other (non HMO) | Admitting: Internal Medicine

## 2021-07-31 ENCOUNTER — Encounter: Payer: Self-pay | Admitting: Internal Medicine

## 2021-07-31 VITALS — BP 120/79 | HR 83 | Temp 97.3°F | Wt 156.0 lb

## 2021-07-31 DIAGNOSIS — J329 Chronic sinusitis, unspecified: Secondary | ICD-10-CM | POA: Diagnosis not present

## 2021-07-31 DIAGNOSIS — E663 Overweight: Secondary | ICD-10-CM | POA: Diagnosis not present

## 2021-07-31 DIAGNOSIS — F32A Depression, unspecified: Secondary | ICD-10-CM | POA: Diagnosis not present

## 2021-07-31 DIAGNOSIS — F419 Anxiety disorder, unspecified: Secondary | ICD-10-CM

## 2021-07-31 DIAGNOSIS — Z6828 Body mass index (BMI) 28.0-28.9, adult: Secondary | ICD-10-CM

## 2021-07-31 DIAGNOSIS — B9789 Other viral agents as the cause of diseases classified elsewhere: Secondary | ICD-10-CM

## 2021-07-31 MED ORDER — CITALOPRAM HYDROBROMIDE 10 MG PO TABS: 10.0000 mg | ORAL_TABLET | Freq: Every day | ORAL | 2 refills | Status: DC

## 2021-07-31 MED ORDER — BUSPIRONE HCL 10 MG PO TABS: 10.0000 mg | ORAL_TABLET | Freq: Two times a day (BID) | ORAL | 1 refills | Status: DC

## 2021-07-31 MED ORDER — PREDNISONE 10 MG PO TABS: ORAL_TABLET | ORAL | 0 refills | Status: DC

## 2021-07-31 NOTE — Assessment & Plan Note (Signed)
Persistent We will restart Citalopram 10 mg daily Buspirone refilled today She declines referral for therapy at this time Support offered

## 2021-07-31 NOTE — Patient Instructions (Signed)

## 2021-07-31 NOTE — Assessment & Plan Note (Signed)
Encourage diet and exercise for weight loss Exercise may also help boost mood

## 2021-07-31 NOTE — Progress Notes (Signed)
Subjective:    Patient ID: Cindy Bonilla, female    DOB: 1981/04/30, 41 y.o.   MRN: [id number]  HPI  Patient presents the clinic today for follow-up of anxiety and depression.  This is a chronic issue for her, managed on Buspirone.  She does feel like her anxiety and depression has gotten worse.  She thinks this situational and secondary to generalized stress.  She was on Citalopram in the past and thinks she may need to get restarted on this.  She is not currently seeing a therapist.  She denies SI/HI.  She also reports headaches and sinus pressure.  She reports this started a few months ago.  The headache is located in her temples.  She describes the pain as pressure.  She reports associated dizziness and runny nose.  She denies nasal congestion, ear pain, sore throat, cough, shortness of breath, fever, chills or body aches.  She has tried Claritin-D OTC with minimal relief of symptoms.  She has no pets living in the home.  She does not smoke.  Review of Systems  Past Medical History:  Diagnosis Date   Anxiety    Arthritis    Depression    Urinary urgency     Current Outpatient Medications  Medication Sig Dispense Refill   busPIRone (BUSPAR) 10 MG tablet Take 2 tablets by mouth twice daily 120 tablet 0   dicyclomine (BENTYL) 20 MG tablet Take 1 tablet (20 mg total) by mouth 3 (three) times daily as needed for spasms. 90 tablet 5   ELINEST 0.3-30 MG-MCG tablet Take 1 tablet by mouth once daily 84 tablet 0   gabapentin (NEURONTIN) 300 MG capsule 2 tabs TID     methocarbamol (ROBAXIN) 500 MG tablet Take 250-500 mg by mouth at bedtime as needed.     omeprazole (PRILOSEC) 20 MG capsule Take 1 capsule (20 mg total) by mouth 2 (two) times daily before a meal. 60 capsule 5   No current facility-administered medications for this visit.    Allergies  Allergen Reactions   Latex Rash and Hives   Amoxicillin    Motrin [Ibuprofen] Other (See Comments)    Rectal bleeding.    Family  History  Problem Relation Age of Onset   Diabetes Mother    Depression Mother    Aneurysm Brother    Other Father        "some rare disease"   Bladder Cancer Neg Hx    Prostate cancer Neg Hx    Kidney cancer Neg Hx     Social History   Socioeconomic History   Marital status: Significant Other    Spouse name: Not on file   Number of children: Not on file   Years of education: Not on file   Highest education level: Not on file  Occupational History   Not on file  Tobacco Use   Smoking status: Former    Types: Cigarettes    Quit date: 09/15/2004    Years since quitting: 16.8   Smokeless tobacco: Never  Vaping Use   Vaping Use: Former   Substances: Flavoring  Substance and Sexual Activity   Alcohol use: Yes    Comment: occasional   Drug use: No   Sexual activity: Not on file  Other Topics Concern   Not on file  Social History Narrative   Not on file   Social Determinants of Health   Financial Resource Strain: Not on file  Food Insecurity: Not on file  Transportation Needs:  Not on file  Physical Activity: Not on file  Stress: Not on file  Social Connections: Not on file  Intimate Partner Violence: Not on file     Constitutional: Patient reports headaches.  Denies fever, malaise, fatigue, or abrupt weight changes.  HEENT: Patient reports sinus pressure and runny nose.  Denies eye pain, eye redness, ear pain, ringing in the ears, wax buildup, nasal congestion, bloody nose, or sore throat. Respiratory: Denies difficulty breathing, shortness of breath, cough or sputum production.   Cardiovascular: Denies chest pain, chest tightness, palpitations or swelling in the hands or feet.  Neurological: Patient reports intermittent dizziness.  Denies difficulty with memory, difficulty with speech or problems with balance and coordination.  Psych: Patient reports anxiety and depression.  Denies SI/HI.  No other specific complaints in a complete review of systems (except as  listed in HPI above).     Objective:   Physical Exam  BP 120/79 (BP Location: Right Arm, Patient Position: Sitting, Cuff Size: Normal)    Pulse 83    Temp (!) 97.3 F (36.3 C) (Temporal)    Wt 156 lb (70.8 kg)    SpO2 98%    BMI 28.53 kg/m   Wt Readings from Last 3 Encounters:  04/11/21 162 lb 3.2 oz (73.6 kg)  12/05/20 172 lb 6.4 oz (78.2 kg)  05/24/20 185 lb 3.2 oz (84 kg)    General: Appears her stated age, overweight, in NAD. HEENT: Head: normal shape and size; Eyes: sclera white and EOMs intact;  Cardiovascular: Normal rate. Pulmonary/Chest: Normal effort. Neurological: Alert and oriented.  Psychiatric: Mood and affect mildly flat. Behavior is normal. Judgment and thought content normal.    BMET    Component Value Date/Time   NA 139 04/11/2021 1447   NA 142 10/18/2015 1047   K 4.1 04/11/2021 1447   CL 105 04/11/2021 1447   CO2 25 04/11/2021 1447   GLUCOSE 117 04/11/2021 1447   BUN 8 04/11/2021 1447   BUN 6 10/18/2015 1047   CREATININE 0.87 04/11/2021 1447   CALCIUM 9.0 04/11/2021 1447   GFRNONAA 70 12/05/2020 0955   GFRAA 81 12/05/2020 0955    Lipid Panel     Component Value Date/Time   CHOL 195 04/11/2021 1447   CHOL 156 10/18/2015 1047   TRIG 110 04/11/2021 1447   HDL 64 04/11/2021 1447   HDL 48 10/18/2015 1047   CHOLHDL 3.0 04/11/2021 1447   LDLCALC 109 (H) 04/11/2021 1447    CBC    Component Value Date/Time   WBC 7.6 04/11/2021 1447   RBC 4.38 04/11/2021 1447   HGB 13.5 04/11/2021 1447   HCT 40.7 04/11/2021 1447   PLT 253 04/11/2021 1447   MCV 92.9 04/11/2021 1447   MCH 30.8 04/11/2021 1447   MCHC 33.2 04/11/2021 1447   RDW 12.1 04/11/2021 1447   LYMPHSABS 2,760 09/29/2019 0850   EOSABS 218 09/29/2019 0850   BASOSABS 30 09/29/2019 0850    Hgb A1C Lab Results  Component Value Date   HGBA1C 5.4 04/11/2021           Assessment & Plan:   Viral Sinusitis:  Can use a Nettie pot which can be purchased from your local pharmacy Do  not take Claritin-D every day as the decongestant use can lead to dependence and high blood pressure Could consider trying Flonase 1 spray each nostril daily after prednisone use Rx for Pred taper x6 days No indication for antibiotics at this time  Update me in  1 month and let me know how you are doing as far as anxiety and depression notes  Webb Silversmith, NP This visit occurred during the SARS-CoV-2 public health emergency.  Safety protocols were in place, including screening questions prior to the visit, additional usage of staff PPE, and extensive cleaning of exam room while observing appropriate contact time as indicated for disinfecting solutions.

## 2021-08-21 ENCOUNTER — Other Ambulatory Visit: Payer: Self-pay | Admitting: Internal Medicine

## 2021-08-22 NOTE — Telephone Encounter (Signed)
Requested medication (s) are due for refill today - yes  Requested medication (s) are on the active medication list -yes  Future visit scheduled -no  Last refill: 07/31/21 #120 1RF  Notes to clinic: Request RF: patient directions different on request than directions on medication list- needs update  Requested Prescriptions  Pending Prescriptions Disp Refills   busPIRone (BUSPAR) 10 MG tablet [Pharmacy Med Name: busPIRone HCl 10 MG Oral Tablet] 120 tablet 0    Sig: Take 2 tablets by mouth twice daily     Psychiatry: Anxiolytics/Hypnotics - Non-controlled Passed - 08/21/2021  9:41 PM      Passed - Valid encounter within last 12 months    Recent Outpatient Visits           3 weeks ago Anxiety and depression   [facility] [place], Salvadore Oxford, NP   4 months ago Encounter for general adult medical examination with abnormal findings   [facility], Salvadore Oxford, NP   8 months ago Anxiety   [facility] [place], Salvadore Oxford, NP   1 year ago Acute pain of right knee   [facility], Jodelle Gross, FNP   1 year ago Encounter for Department of Transportation (DOT) examination for trucking license   [facility], Jodelle Gross, [place]                 Requested Prescriptions  Pending Prescriptions Disp Refills   busPIRone (BUSPAR) 10 MG tablet [Pharmacy Med Name: busPIRone HCl 10 MG Oral Tablet] 120 tablet 0    Sig: Take 2 tablets by mouth twice daily     Psychiatry: Anxiolytics/Hypnotics - Non-controlled Passed - 08/21/2021  9:41 PM      Passed - Valid encounter within last 12 months    Recent Outpatient Visits           3 weeks ago Anxiety and depression   [facility], Salvadore Oxford, NP   4 months ago Encounter for general adult medical examination with abnormal findings   [facility] [place], Salvadore Oxford, NP   8 months ago Anxiety   [facility] [place], Salvadore Oxford, NP    1 year ago Acute pain of right knee   [facility], Jodelle Gross, FNP   1 year ago Encounter for Department of Transportation (DOT) examination for trucking license   [facility], Jodelle Gross, [place]

## 2021-09-15 ENCOUNTER — Ambulatory Visit: Payer: Self-pay

## 2021-09-15 DIAGNOSIS — G8929 Other chronic pain: Secondary | ICD-10-CM
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
## 2021-09-19 NOTE — Addendum Note (Signed)
Addended by: Lorre Munroe on: 09/19/2021 05:48 AM ? ? Modules accepted: Orders ? ?

## 2021-09-19 NOTE — Telephone Encounter (Signed)
Referral placed.

## 2021-10-03 ENCOUNTER — Other Ambulatory Visit: Payer: Self-pay | Admitting: Internal Medicine

## 2021-10-03 DIAGNOSIS — Z3040 Encounter for surveillance of contraceptives, unspecified: Secondary | ICD-10-CM

## 2021-10-04 MED ORDER — ELINEST 0.3-30 MG-MCG PO TABS: 1.0000 | ORAL_TABLET | Freq: Every day | ORAL | 1 refills | Status: DC

## 2021-10-04 NOTE — Telephone Encounter (Signed)
Requested medication (s) are due for refill today: yes ? ?Requested medication (s) are on the active medication list: yes ? ?Last refill:  10/04/21 ? ?Future visit scheduled: yes ? ?Notes to clinic:  Unable to refill per protocol, last refill by provider 10/04/21. ? ? ?  ?Requested Prescriptions  ?Pending Prescriptions Disp Refills  ? ELINEST 0.3-30 MG-MCG tablet [Pharmacy Med Name: Elinest 0.3-30 MG-MCG Oral Tablet] 84 tablet 0  ?  Sig: Take 1 tablet by mouth once daily  ?  ? OB/GYN:  Contraceptives Passed - 10/03/2021  2:58 PM  ?  ?  Passed - Last BP in normal range  ?  BP Readings from Last 1 Encounters:  ?07/31/21 120/79  ?  ?  ?  ?  Passed - Valid encounter within last 12 months  ?  Recent Outpatient Visits   ? ?      ? 2 months ago Anxiety and depression  ? [facility] [place], Salvadore Oxford, NP  ? 5 months ago Encounter for general adult medical examination with abnormal findings  ? [facility] [place], Salvadore Oxford, NP  ? 10 months ago Anxiety  ? [facility] [place], [place] W, NP  ? 1 year ago Acute pain of right knee  ? [facility], Jodelle Gross, FNP  ? 1 year ago Encounter for Department of Transportation (DOT) examination for trucking license  ? [facility], Jodelle Gross, FNP  ? ?  ?  ? ?  ?  ?  Passed - Patient is not a smoker  ?  ?  ? ? ?

## 2021-10-21 ENCOUNTER — Other Ambulatory Visit: Payer: Self-pay | Admitting: Internal Medicine

## 2021-10-21 DIAGNOSIS — K219 Gastro-esophageal reflux disease without esophagitis: Secondary | ICD-10-CM

## 2021-10-23 NOTE — Telephone Encounter (Signed)
Requested Prescriptions  ?Pending Prescriptions Disp Refills  ?? citalopram (CELEXA) 10 MG tablet [Pharmacy Med Name: Citalopram Hydrobromide 10 MG Oral Tablet] 30 tablet 2  ?  Sig: Take 1 tablet by mouth once daily  ?  ? Psychiatry:  Antidepressants - SSRI Passed - 10/21/2021 11:53 AM  ?  ?  Passed - Completed PHQ-2 or PHQ-9 in the last 360 days  ?  ?  Passed - Valid encounter within last 6 months  ?  Recent Outpatient Visits   ?      ? 2 months ago Anxiety and depression  ? [facility] [place], Salvadore Oxford, NP  ? 6 months ago Encounter for general adult medical examination with abnormal findings  ? [facility] [place], Salvadore Oxford, NP  ? 10 months ago Anxiety  ? [facility] [place], [place], NP  ? 1 year ago Acute pain of right knee  ? [facility], Jodelle Gross, FNP  ? 1 year ago Encounter for Department of Transportation (DOT) examination for trucking license  ? [facility] [place], Jodelle Gross, FNP  ?  ?  ? ?  ?  ?  ?? omeprazole (PRILOSEC) 20 MG capsule [Pharmacy Med Name: Omeprazole 20 MG Oral Capsule Delayed Release] 60 capsule 2  ?  Sig: TAKE 1 CAPSULE BY MOUTH TWICE DAILY BEFORE A MEAL  ?  ? Gastroenterology: Proton Pump Inhibitors Passed - 10/21/2021 11:53 AM  ?  ?  Passed - Valid encounter within last 12 months  ?  Recent Outpatient Visits   ?      ? 2 months ago Anxiety and depression  ? [facility] [place], Salvadore Oxford, NP  ? 6 months ago Encounter for general adult medical examination with abnormal findings  ? [facility] [place], Salvadore Oxford, NP  ? 10 months ago Anxiety  ? [facility] [place], [place], NP  ? 1 year ago Acute pain of right knee  ? [facility], Jodelle Gross, FNP  ? 1 year ago Encounter for Department of Transportation (DOT) examination for trucking license  ? [facility], Jodelle Gross, FNP  ?  ?  ? ?  ?  ?  ? ?

## 2021-11-14 ENCOUNTER — Other Ambulatory Visit: Payer: Self-pay | Admitting: Internal Medicine

## 2021-11-15 ENCOUNTER — Encounter: Payer: Self-pay | Admitting: Internal Medicine

## 2021-11-15 NOTE — Telephone Encounter (Signed)
Pt is very upset refill is not done, states it is causing her anxiety to be dependent on it and maybe should not be on it.

## 2021-11-15 NOTE — Telephone Encounter (Signed)
Pt wanted to know why she has to request a refill of this every month, please advise.

## 2021-11-15 NOTE — Telephone Encounter (Signed)
Requested Prescriptions  Pending Prescriptions Disp Refills  . busPIRone (BUSPAR) 10 MG tablet [Pharmacy Med Name: busPIRone HCl 10 MG Oral Tablet] 120 tablet 1    Sig: Take 1 tablet by mouth twice daily     Psychiatry: Anxiolytics/Hypnotics - Non-controlled Passed - 11/15/2021  1:49 PM      Passed - Valid encounter within last 12 months    Recent Outpatient Visits          3 months ago Anxiety and depression   [facility] [place], Salvadore Oxford, NP   7 months ago Encounter for general adult medical examination with abnormal findings   [facility] [place], Salvadore Oxford, NP   11 months ago Anxiety   [facility] [place], Salvadore Oxford, NP   1 year ago Acute pain of right knee   [facility], Jodelle Gross, FNP   1 year ago Encounter for Department of Transportation (DOT) examination for trucking license   [facility], Jodelle Gross, [place]

## 2021-11-16 ENCOUNTER — Telehealth: Payer: Self-pay | Admitting: *Deleted

## 2021-11-16 ENCOUNTER — Other Ambulatory Visit: Payer: Self-pay | Admitting: Internal Medicine

## 2021-11-16 MED ORDER — BUSPIRONE HCL 10 MG PO TABS: 20.0000 mg | ORAL_TABLET | Freq: Two times a day (BID) | ORAL | 1 refills | Status: DC

## 2021-11-16 NOTE — Telephone Encounter (Signed)
Pt reports she has been taking Buspirone 10 mg  "Two during the day and 2 at night." States she has always taken med this way. Reports went to pick up  and pharmacist states order was incorrect. Pt expressing frustration.  Please review and advise. Pt requesting CB as well.

## 2021-11-16 NOTE — Addendum Note (Signed)
Addended by: Lorre Munroe on: 11/16/2021 04:14 PM   Modules accepted: Orders

## 2021-11-16 NOTE — Telephone Encounter (Signed)
I have fixed the prescription and sent it back to her pharmacy.

## 2021-11-21 NOTE — Telephone Encounter (Signed)
See Refill encounter.    busPIRone (BUSPAR) 10 MG tablet 120 tablet 1 11/16/2021    Sig - Route: Take 2 tablets (20 mg total) by mouth 2 (two) times daily. - Oral   Sent to pharmacy as: busPIRone (BUSPAR) 10 MG tablet   E-Prescribing Status: Receipt confirmed by pharmacy (11/16/2021  4:14 PM EDT)

## 2021-11-21 NOTE — Telephone Encounter (Signed)
Refused Buspar 10 mg.   Received at [facility] on 11/16/2021 #120, 1 refill at 4:14 PM.   This request is for 0 refills instead of 1 refill.

## 2021-12-07 ENCOUNTER — Other Ambulatory Visit: Payer: Self-pay | Admitting: Internal Medicine

## 2021-12-08 ENCOUNTER — Other Ambulatory Visit: Payer: Self-pay | Admitting: Internal Medicine

## 2021-12-08 DIAGNOSIS — R109 Unspecified abdominal pain: Secondary | ICD-10-CM

## 2021-12-08 DIAGNOSIS — K219 Gastro-esophageal reflux disease without esophagitis: Secondary | ICD-10-CM

## 2021-12-08 NOTE — Telephone Encounter (Signed)
Requested medication (s) are due for refill today: Yes  Requested medication (s) are on the active medication list: Yes  Last refill:  Bentyl - 12/05/20  Prilosec - 10/23/21 #60 with 2 refills  Future visit scheduled: No  Notes to clinic:  Bentyl has expired, Prilosec has refills.    Requested Prescriptions  Pending Prescriptions Disp Refills   dicyclomine (BENTYL) 20 MG tablet [Pharmacy Med Name: Dicyclomine HCl 20 MG Oral Tablet] 90 tablet 0    Sig: TAKE 1 TABLET BY MOUTH THREE TIMES DAILY AS NEEDED FOR SPASMS     Gastroenterology:  Antispasmodic Agents Passed - 12/08/2021  1:16 PM      Passed - Valid encounter within last 12 months    Recent Outpatient Visits           4 months ago Anxiety and depression   [facility] [place], Salvadore Oxford, NP   8 months ago Encounter for general adult medical examination with abnormal findings   [facility], Salvadore Oxford, NP   1 year ago Anxiety   [facility] [place], Salvadore Oxford, NP   1 year ago Acute pain of right knee   [facility], Jodelle Gross, FNP   1 year ago Encounter for Department of Transportation (DOT) examination for trucking license   [facility], Jodelle Gross, FNP               omeprazole (PRILOSEC) 20 MG capsule [place] Med Name: Omeprazole 20 MG Oral Capsule Delayed Release] 180 capsule 0    Sig: TAKE 1 CAPSULE BY MOUTH TWICE DAILY BEFORE A MEAL     Gastroenterology: Proton Pump Inhibitors Passed - 12/08/2021  1:16 PM      Passed - Valid encounter within last 12 months    Recent Outpatient Visits           4 months ago Anxiety and depression   [facility], Salvadore Oxford, NP   8 months ago Encounter for general adult medical examination with abnormal findings   [facility] [place], Salvadore Oxford, NP   1 year ago Anxiety   [facility] [place], Salvadore Oxford, NP   1 year ago Acute pain of right knee    [facility], Jodelle Gross, FNP   1 year ago Encounter for Department of Transportation (DOT) examination for trucking license   [facility], Jodelle Gross, [place]

## 2021-12-18 DIAGNOSIS — G8929 Other chronic pain: Secondary | ICD-10-CM | POA: Diagnosis not present

## 2021-12-18 DIAGNOSIS — S7011XA Contusion of right thigh, initial encounter: Secondary | ICD-10-CM | POA: Diagnosis not present

## 2021-12-18 DIAGNOSIS — M5441 Lumbago with sciatica, right side: Secondary | ICD-10-CM | POA: Diagnosis not present

## 2021-12-18 DIAGNOSIS — S7421XA Injury of cutaneous sensory nerve at hip and high level, right leg, initial encounter: Secondary | ICD-10-CM | POA: Diagnosis not present

## 2022-01-08 ENCOUNTER — Other Ambulatory Visit: Payer: Self-pay | Admitting: Internal Medicine

## 2022-01-09 NOTE — Telephone Encounter (Signed)
Requested Prescriptions  Pending Prescriptions Disp Refills  . busPIRone (BUSPAR) 10 MG tablet [Pharmacy Med Name: busPIRone HCl 10 MG Oral Tablet] 360 tablet 0    Sig: Take 2 tablets by mouth twice daily     Psychiatry: Anxiolytics/Hypnotics - Non-controlled Passed - 01/08/2022 11:51 AM      Passed - Valid encounter within last 12 months    Recent Outpatient Visits          5 months ago Anxiety and depression   [facility] [place], Salvadore Oxford, NP   9 months ago Encounter for general adult medical examination with abnormal findings   [facility] [place], Salvadore Oxford, NP   1 year ago Anxiety   [facility] [place], Salvadore Oxford, NP   1 year ago Acute pain of right knee   [facility], Jodelle Gross, FNP   1 year ago Encounter for Department of Transportation (DOT) examination for trucking license   [facility], Jodelle Gross, [place]

## 2022-02-12 IMAGING — XA Imaging study
1 series · 1 of 1 positions shown · non-contrast
Comparison: none

CLINICAL DATA: Lumbosacral spondylosis without myelopathy. Little
relief from previous bedside injection.

[Series 1: ortho standard · 1 of 1 slices shown]
[im 1/1]
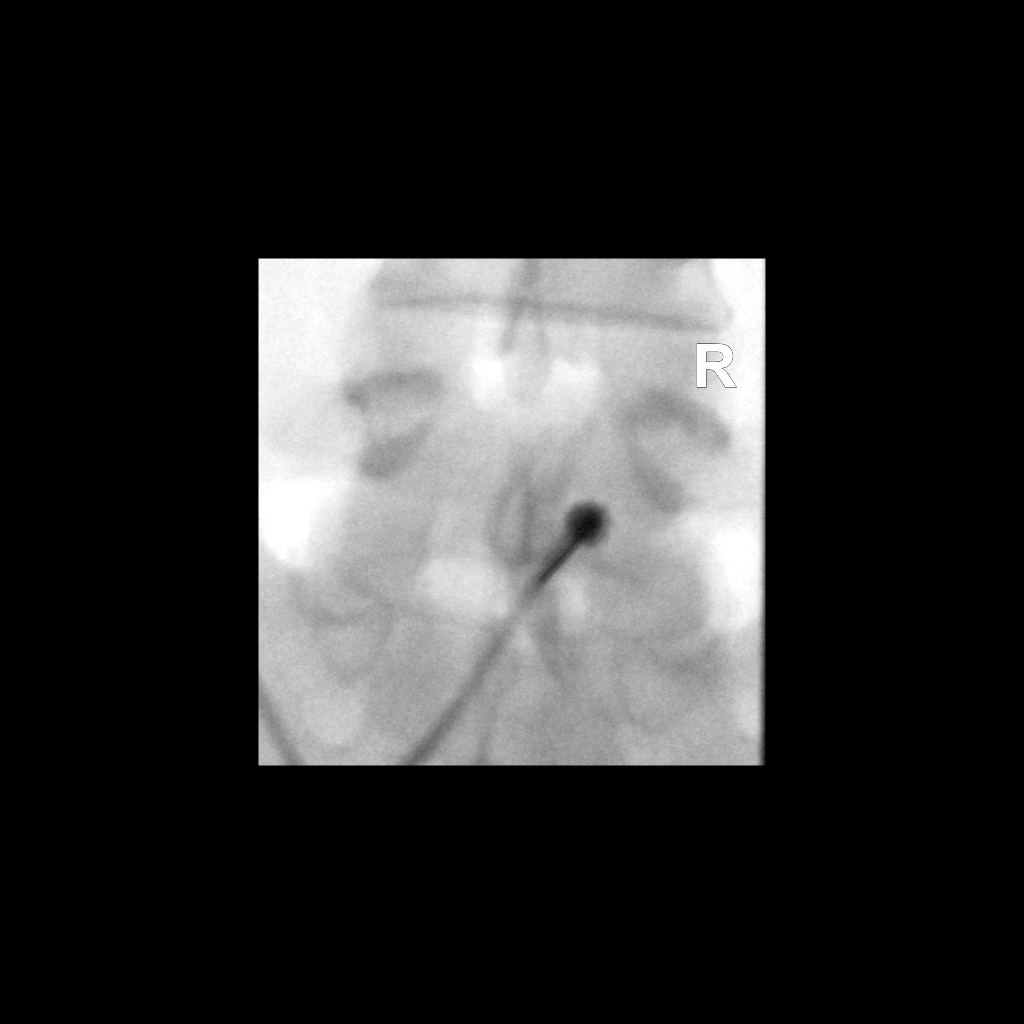

[1 of 1 positions shown; findings below may reference images not displayed]

EXAM:
LUMBAR EPIDURAL INJECTION:

DIAGNOSTIC EPIDURAL INJECTION:

THERAPEUTIC EPIDURAL INJECTION:

PROCEDURE:
The procedure, risks, benefits, and alternatives were explained to
the patient. Questions regarding the procedure were encouraged and
answered. The patient understands and consents to the procedure.

An interlaminar approach was performed on right at L5-S1. The
overlying skin was cleansed and anesthetized. A 20 gauge epidural
needle was advanced using loss-of-resistance technique.

Injection of Isovue-M 200 shows a good epidural pattern with spread
above and below the level of needle placement, primarily on the
right no vascular opacification is seen.

80mg of Depo-Medrol mixed with 2ml lidocaine 1% were instilled. The
procedure was well-tolerated, and the patient was discharged thirty
minutes following the injection in good condition.

FLUOROSCOPY TIME:  19 seconds; 13 uMymR DAP

COMPLICATIONS:
None immediate
IMPRESSION: Technically successful epidural injection on the right at L5-S1.

## 2022-02-21 DIAGNOSIS — M7711 Lateral epicondylitis, right elbow: Secondary | ICD-10-CM | POA: Diagnosis not present

## 2022-02-21 DIAGNOSIS — G8929 Other chronic pain: Secondary | ICD-10-CM | POA: Diagnosis not present

## 2022-02-21 DIAGNOSIS — M5441 Lumbago with sciatica, right side: Secondary | ICD-10-CM | POA: Diagnosis not present

## 2022-02-21 DIAGNOSIS — M5136 Other intervertebral disc degeneration, lumbar region: Secondary | ICD-10-CM | POA: Diagnosis not present

## 2022-02-21 DIAGNOSIS — S7421XD Injury of cutaneous sensory nerve at hip and high level, right leg, subsequent encounter: Secondary | ICD-10-CM | POA: Diagnosis not present

## 2022-02-27 ENCOUNTER — Ambulatory Visit: Payer: Managed Care, Other (non HMO) | Admitting: Internal Medicine

## 2022-03-06 ENCOUNTER — Ambulatory Visit: Payer: Managed Care, Other (non HMO) | Admitting: Internal Medicine

## 2022-03-16 ENCOUNTER — Telehealth (INDEPENDENT_AMBULATORY_CARE_PROVIDER_SITE_OTHER): Payer: Self-pay | Admitting: Nurse Practitioner

## 2022-03-16 NOTE — Telephone Encounter (Signed)
Called and LVM that she needed information.  I returned call and LVM for her to return call.

## 2022-03-19 ENCOUNTER — Other Ambulatory Visit: Payer: Self-pay | Admitting: Internal Medicine

## 2022-03-19 ENCOUNTER — Other Ambulatory Visit: Payer: Self-pay

## 2022-03-19 DIAGNOSIS — Z3040 Encounter for surveillance of contraceptives, unspecified: Secondary | ICD-10-CM

## 2022-03-19 MED ORDER — METHOCARBAMOL 500 MG PO TABS
250.0000 mg | ORAL_TABLET | Freq: Every evening | ORAL | 0 refills | Status: DC | PRN
  Filled 2022-03-19: qty 30, 30d supply, fill #0

## 2022-03-20 NOTE — Telephone Encounter (Signed)
Requested Prescriptions  Pending Prescriptions Disp Refills  . citalopram (CELEXA) 10 MG tablet [Pharmacy Med Name: Citalopram Hydrobromide 10 MG Oral Tablet] 30 tablet 0    Sig: Take 1 tablet by mouth once daily     Psychiatry:  Antidepressants - SSRI Failed - 03/19/2022  2:56 PM      Failed - Valid encounter within last 6 months    Recent Outpatient Visits          7 months ago Anxiety and depression   [place], NP   11 months ago Encounter for general adult medical examination with abnormal findings   [facility] [place], Coralie Keens, NP   1 year ago [facility] [employer], Coralie Keens, NP   1 year ago Acute pain of right knee   [facility], Lupita Raider, FNP   1 year ago Encounter for Department of Transportation (DOT) examination for trucking license   [place], FNP      Future Appointments            In 1 week Garn[facility] Gunner, Coralie Keens, NP [place] - Completed PHQ-2 or PHQ-9 in the last 360 days      . ELINEST 0.3-30 MG-MCG tablet [Pharmacy Med Name: Elinest 0.3-30 MG-MCG Oral Tablet] 84 tablet 0    Sig: Take 1 tablet by mouth once daily     OB/GYN:  Contraceptives Passed - 03/19/2022  2:56 PM      Passed - Last BP in normal range    BP Readings from Last 1 Encounters:  07/31/21 120/79         Passed - Valid encounter within last 12 months    Recent Outpatient Visits          7 months ago Anxiety and depression   [place], Coralie Keens, NP   11 months ago Encounter for general adult medical examination with abnormal findings   [facility] [place], Coralie Keens, NP   1 year ago [facility], Coralie Keens, NP   1 year ago Acute pain of right knee   [facility], Lupita Raider, FNP   1 year ago Encounter for Department of Transportation (DOT)  examination for trucking license   [facility], Lupita Raider, FNP      Future Appointments            In 1 week Garnette Gunner, Coralie Keens, NP [facility] [place] [facility] - Patient is not a smoker

## 2022-03-27 ENCOUNTER — Encounter: Payer: Self-pay | Admitting: Internal Medicine

## 2022-03-27 ENCOUNTER — Ambulatory Visit (INDEPENDENT_AMBULATORY_CARE_PROVIDER_SITE_OTHER): Payer: 59 | Admitting: Internal Medicine

## 2022-03-27 VITALS — BP 106/70 | HR 96 | Temp 97.3°F | Ht 62.0 in | Wt 134.0 lb

## 2022-03-27 DIAGNOSIS — Z0001 Encounter for general adult medical examination with abnormal findings: Secondary | ICD-10-CM

## 2022-03-27 DIAGNOSIS — Z1231 Encounter for screening mammogram for malignant neoplasm of breast: Secondary | ICD-10-CM

## 2022-03-27 NOTE — Patient Instructions (Signed)

## 2022-03-27 NOTE — Progress Notes (Signed)
Subjective:    Patient ID: Cindy Bonilla, female    DOB: [birth year], 41 y.o.   MRN: [id number]  HPI  Patient presents to clinic today for her annual exam.  Flu: never Tetanus: 09/2017 COVID: Moderna x2 Pap smear: 09/2019 Mammogram: Never Vision screening: as needed Dentist: biannually  Diet: She does eat some meat. She consumes fruits and veggies. She does eat some fried foods. She drinks mostly water, coffee, energy drinks. Exercise: Gardening  Review of Systems  Past Medical History:  Diagnosis Date   Anxiety    Arthritis    Depression    Urinary urgency     Current Outpatient Medications  Medication Sig Dispense Refill   busPIRone (BUSPAR) 10 MG tablet Take 2 tablets by mouth twice daily 360 tablet 0   citalopram (CELEXA) 10 MG tablet Take 1 tablet by mouth once daily 30 tablet 0   dicyclomine (BENTYL) 20 MG tablet TAKE 1 TABLET BY MOUTH THREE TIMES DAILY AS NEEDED FOR SPASMS 90 tablet 0   ELINEST 0.3-30 MG-MCG tablet Take 1 tablet by mouth once daily 84 tablet 0   methocarbamol (ROBAXIN) 500 MG tablet Take 0.5-1 tablets (250-500 mg total) by mouth at bedtime as needed. 30 tablet 0   omeprazole (PRILOSEC) 20 MG capsule TAKE 1 CAPSULE BY MOUTH TWICE DAILY BEFORE A MEAL 180 capsule 0   predniSONE (DELTASONE) 10 MG tablet Take 6 tabs on day 1, 5 tabs on day 2, 4 tabs on day 3, 3 tabs on day 4, 2 tabs on day 5, 1 tab on day 6 21 tablet 0   No current facility-administered medications for this visit.    Allergies  Allergen Reactions   Latex Rash and Hives   Amoxicillin    Motrin [Ibuprofen] Other (See Comments)    Rectal bleeding.    Family History  Problem Relation Age of Onset   Diabetes Mother    Depression Mother    Aneurysm Brother    Other Father        "some rare disease"   Bladder Cancer Neg Hx    Prostate cancer Neg Hx    Kidney cancer Neg Hx     Social History   Socioeconomic History   Marital status: Significant Other    Spouse name: Not on  file   Number of children: Not on file   Years of education: Not on file   Highest education level: Not on file  Occupational History   Not on file  Tobacco Use   Smoking status: Former    Types: Cigarettes    Quit date: 09/15/2004    Years since quitting: 17.5   Smokeless tobacco: Never  Vaping Use   Vaping Use: Former   Substances: Flavoring  Substance and Sexual Activity   Alcohol use: Yes    Comment: occasional   Drug use: No   Sexual activity: Not on file  Other Topics Concern   Not on file  Social History Narrative   Not on file   Social Determinants of Health   Financial Resource Strain: Not on file  Food Insecurity: Not on file  Transportation Needs: Not on file  Physical Activity: Not on file  Stress: Not on file  Social Connections: Not on file  Intimate Partner Violence: Not on file     Constitutional: Denies fever, malaise, fatigue, headache or abrupt weight changes.  HEENT: Denies eye pain, eye redness, ear pain, ringing in the ears, wax buildup, runny nose, nasal congestion, bloody  nose, or sore throat. Respiratory: Denies difficulty breathing, shortness of breath, cough or sputum production.   Cardiovascular: Denies chest pain, chest tightness, palpitations or swelling in the hands or feet.  Gastrointestinal: Denies abdominal pain, bloating, constipation, diarrhea or blood in the stool.  GU: Denies urgency, frequency, pain with urination, burning sensation, blood in urine, odor or discharge. Musculoskeletal: Patient reports chronic back pain.  Denies decrease in range of motion, difficulty with gait, or joint swelling.  Skin: Denies redness, rashes, lesions or ulcercations.  Neurological: Patient reports insomnia.  Denies dizziness, difficulty with memory, difficulty with speech or problems with balance and coordination.  Psych: Patient has a history of anxiety and depression.  Denies SI/HI.  No other specific complaints in a complete review of systems  (except as listed in HPI above).     Objective:   Physical Exam  BP 106/70 (BP Location: Left Arm, Patient Position: Sitting, Cuff Size: Normal)   Pulse 96   Temp (!) 97.3 F (36.3 C) (Temporal)   Ht $R'5\' 2"'LH$  (1.575 m)   Wt 134 lb (60.8 kg)   SpO2 97%   BMI 24.51 kg/m   Wt Readings from Last 3 Encounters:  07/31/21 156 lb (70.8 kg)  04/11/21 162 lb 3.2 oz (73.6 kg)  12/05/20 172 lb 6.4 oz (78.2 kg)    General: Appears her stated age, well developed, well nourished in NAD. Skin: Warm, dry and intact.  HEENT: Head: normal shape and size; Eyes: sclera white, no icterus, conjunctiva pink, PERRLA and EOMs intact;  Neck:  Neck supple, trachea midline. No masses, lumps or thyromegaly present.  Cardiovascular: Normal rate and rhythm. S1,S2 noted.  No murmur, rubs or gallops noted. No JVD or BLE edema.  Pulmonary/Chest: Normal effort and positive vesicular breath sounds. No respiratory distress. No wheezes, rales or ronchi noted.  Abdomen: Normal bowel sounds. Musculoskeletal: Strength 5/5 BUE/BLE. No difficulty with gait.  Neurological: Alert and oriented. Cranial nerves II-XII grossly intact. Coordination normal.  Psychiatric: Mood and affect normal. Behavior is normal. Judgment and thought content normal.   BMET    Component Value Date/Time   NA 139 04/11/2021 1447   NA 142 10/18/2015 1047   K 4.1 04/11/2021 1447   CL 105 04/11/2021 1447   CO2 25 04/11/2021 1447   GLUCOSE 117 04/11/2021 1447   BUN 8 04/11/2021 1447   BUN 6 10/18/2015 1047   CREATININE 0.87 04/11/2021 1447   CALCIUM 9.0 04/11/2021 1447   GFRNONAA 70 12/05/2020 0955   GFRAA 81 12/05/2020 0955    Lipid Panel     Component Value Date/Time   CHOL 195 04/11/2021 1447   CHOL 156 10/18/2015 1047   TRIG 110 04/11/2021 1447   HDL 64 04/11/2021 1447   HDL 48 10/18/2015 1047   CHOLHDL 3.0 04/11/2021 1447   LDLCALC 109 (H) 04/11/2021 1447    CBC    Component Value Date/Time   WBC 7.6 04/11/2021 1447   RBC  4.38 04/11/2021 1447   HGB 13.5 04/11/2021 1447   HCT 40.7 04/11/2021 1447   PLT 253 04/11/2021 1447   MCV 92.9 04/11/2021 1447   MCH 30.8 04/11/2021 1447   MCHC 33.2 04/11/2021 1447   RDW 12.1 04/11/2021 1447   LYMPHSABS 2,760 09/29/2019 0850   EOSABS 218 09/29/2019 0850   BASOSABS 30 09/29/2019 0850    Hgb A1C Lab Results  Component Value Date   HGBA1C 5.4 04/11/2021           Assessment &  Plan:   Preventative Health Maintenance:  She declines flu shot today Tetanus UTD Encouraged her to get her COVID booster Pap smear UTD Mammogram ordered- she will call to schedule  Encouraged her to consume a balanced diet and exercise regimen Advised her to see an eye doctor and dentist annually We will check CBC, c-Met, lipid profile today  RTC in 6 months, follow-up chronic conditions Webb Silversmith, NP

## 2022-04-03 ENCOUNTER — Encounter: Payer: Managed Care, Other (non HMO) | Admitting: Internal Medicine

## 2022-04-23 ENCOUNTER — Encounter (INDEPENDENT_AMBULATORY_CARE_PROVIDER_SITE_OTHER): Payer: Self-pay

## 2022-04-24 ENCOUNTER — Encounter: Payer: BLUE CROSS/BLUE SHIELD | Admitting: Internal Medicine

## 2022-04-24 NOTE — Progress Notes (Deleted)
Commercial Driver Medical Examination   Cindy Bonilla is a 41 y.o. female who presents today for a commercial driver fitness determination physical exam. The patient reports no problems. The following portions of the patient's history were reviewed and updated as appropriate: allergies, current medications, past family history, past medical history, past social history, past surgical history, and problem list. Review of Systems  Past Medical History:  Diagnosis Date   Anxiety    Arthritis    Depression    Urinary urgency     Current Outpatient Medications  Medication Sig Dispense Refill   busPIRone (BUSPAR) 10 MG tablet Take 2 tablets by mouth twice daily 360 tablet 0   dicyclomine (BENTYL) 20 MG tablet TAKE 1 TABLET BY MOUTH THREE TIMES DAILY AS NEEDED FOR SPASMS 90 tablet 0   ELINEST 0.3-30 MG-MCG tablet Take 1 tablet by mouth once daily 84 tablet 0   omeprazole (PRILOSEC) 20 MG capsule TAKE 1 CAPSULE BY MOUTH TWICE DAILY BEFORE A MEAL 180 capsule 0   No current facility-administered medications for this visit.    Allergies  Allergen Reactions   Latex Rash and Hives   Amoxicillin    Motrin [Ibuprofen] Other (See Comments)    Rectal bleeding.    Family History  Problem Relation Age of Onset   Diabetes Mother    Depression Mother    Aneurysm Brother    Other Father        "some rare disease"   Bladder Cancer Neg Hx    Prostate cancer Neg Hx    Kidney cancer Neg Hx     Social History   Socioeconomic History   Marital status: Significant Other    Spouse name: Not on file   Number of children: Not on file   Years of education: Not on file   Highest education level: Not on file  Occupational History   Not on file  Tobacco Use   Smoking status: Former    Types: Cigarettes    Quit date: 09/15/2004    Years since quitting: 17.6   Smokeless tobacco: Never  Vaping Use   Vaping Use: Former   Substances: Flavoring  Substance and Sexual Activity   Alcohol use: Yes     Comment: occasional   Drug use: No   Sexual activity: Not on file  Other Topics Concern   Not on file  Social History Narrative   Not on file   Social Determinants of Health   Financial Resource Strain: Not on file  Food Insecurity: Not on file  Transportation Needs: Not on file  Physical Activity: Not on file  Stress: Not on file  Social Connections: Not on file  Intimate Partner Violence: Not on file     Constitutional: Denies fever, malaise, fatigue, headache or abrupt weight changes.  HEENT: Denies eye pain, eye redness, ear pain, ringing in the ears, wax buildup, runny nose, nasal congestion, bloody nose, or sore throat. Respiratory: Denies difficulty breathing, shortness of breath, cough or sputum production.   Cardiovascular: Denies chest pain, chest tightness, palpitations or swelling in the hands or feet.  Gastrointestinal: Denies abdominal pain, bloating, constipation, diarrhea or blood in the stool.  GU: Denies urgency, frequency, pain with urination, burning sensation, blood in urine, odor or discharge. Musculoskeletal: Patient reports chronic low back pain.  Denies decrease in range of motion, difficulty with gait, or joint swelling.  Skin: Denies redness, rashes, lesions or ulcercations.  Neurological: Patient reports insomnia.  Denies dizziness, difficulty with memory, difficulty  with speech or problems with balance and coordination.  Psych: Patient has a history of anxiety and depression.  Denies SI/HI.  No other specific complaints in a complete review of systems (except as listed in HPI above).   Objective:    Vision:  Uncorrected Corrected Horizontal Field of Vision  Right Eye {result:19455::"20/20"} {result:19455::"20/20"} 70 degrees  Left Eye  {result:19455::"20/20"} {result:19455::"20/20"} 70 degrees  Both Eyes  {result:19455::"20/20"} {result:19455::"20/20"}    Applicant canrecognize and distinguish among traffic control signals and devices showing  standard red, green, and amber colors.  {Corrective lens required?:19458::" "}  Monocular Vision?: No   Hearing:        Right Ear  > 61ft     Left Ear  > 78ft      {Hearing aid requirement:19457::" "}  There were no vitals taken for this visit. Wt Readings from Last 3 Encounters:  03/27/22 134 lb (60.8 kg)  07/31/21 156 lb (70.8 kg)  04/11/21 162 lb 3.2 oz (73.6 kg)    General: Appears her stated age, well developed, well nourished in NAD. Skin: Warm, dry and intact. No rashes, lesions or ulcerations noted. HEENT: Head: normal shape and size; Eyes: sclera white, no icterus, conjunctiva pink, PERRLA and EOMs intact; Ears: Tm's gray and intact, normal light reflex; Nose: mucosa pink and moist, septum midline; Throat/Mouth: Teeth present, mucosa pink and moist, no exudate, lesions or ulcerations noted.  Neck:  Neck supple, trachea midline. No masses, lumps or thyromegaly present.  Cardiovascular: Normal rate and rhythm. S1,S2 noted.  No murmur, rubs or gallops noted. No JVD or BLE edema. No carotid bruits noted. Pulmonary/Chest: Normal effort and positive vesicular breath sounds. No respiratory distress. No wheezes, rales or ronchi noted.  Abdomen: Soft and nontender. Normal bowel sounds. No distention or masses noted. Liver, spleen and kidneys non palpable. Musculoskeletal: Normal range of motion. Strength 5/5 BUE/BLE. No difficulty with gait.  Neurological: Alert and oriented. Cranial nerves II-XII grossly intact. Coordination normal.  Psychiatric: Mood and affect normal. Behavior is normal. Judgment and thought content normal.     BMET    Component Value Date/Time   NA 139 04/11/2021 1447   NA 142 10/18/2015 1047   K 4.1 04/11/2021 1447   CL 105 04/11/2021 1447   CO2 25 04/11/2021 1447   GLUCOSE 117 04/11/2021 1447   BUN 8 04/11/2021 1447   BUN 6 10/18/2015 1047   CREATININE 0.87 04/11/2021 1447   CALCIUM 9.0 04/11/2021 1447   GFRNONAA 70 12/05/2020 0955   GFRAA 81  12/05/2020 0955    Lipid Panel     Component Value Date/Time   CHOL 195 04/11/2021 1447   CHOL 156 10/18/2015 1047   TRIG 110 04/11/2021 1447   HDL 64 04/11/2021 1447   HDL 48 10/18/2015 1047   CHOLHDL 3.0 04/11/2021 1447   LDLCALC 109 (H) 04/11/2021 1447    CBC    Component Value Date/Time   WBC 7.6 04/11/2021 1447   RBC 4.38 04/11/2021 1447   HGB 13.5 04/11/2021 1447   HCT 40.7 04/11/2021 1447   PLT 253 04/11/2021 1447   MCV 92.9 04/11/2021 1447   MCH 30.8 04/11/2021 1447   MCHC 33.2 04/11/2021 1447   RDW 12.1 04/11/2021 1447   LYMPHSABS 2,760 09/29/2019 0850   EOSABS 218 09/29/2019 0850   BASOSABS 30 09/29/2019 0850    Hgb A1C Lab Results  Component Value Date   HGBA1C 5.4 04/11/2021        Labs: Lab Results  Component Value Date   SPECGRAV 1.020 09/29/2019   PROTEINUR NEGATIVE 11/18/2019   BILIRUBINUR NEGATIVE 11/18/2019   GLUCOSEU NEGATIVE 11/18/2019      Assessment:    Healthy female exam.  {Determination:19453}    Plan:    {Plan:19460::"Medical examiners certificate completed and printed.","Return as needed."}     Nicki Reaper, NP

## 2022-04-30 ENCOUNTER — Other Ambulatory Visit: Payer: Self-pay | Admitting: Internal Medicine

## 2022-05-01 NOTE — Telephone Encounter (Signed)
Requested Prescriptions  Pending Prescriptions Disp Refills   busPIRone (BUSPAR) 10 MG tablet [Pharmacy Med Name: busPIRone HCl 10 MG Oral Tablet] 360 tablet 3    Sig: Take 2 tablets by mouth twice daily     Psychiatry: Anxiolytics/Hypnotics - Non-controlled Passed - 04/30/2022  8:05 PM      Passed - Valid encounter within last 12 months    Recent Outpatient Visits           1 month ago Encounter for general adult medical examination with abnormal findings   [facility] [place], Coralie Keens, NP   9 months ago Anxiety and depression   [facility] [place], Coralie Keens, NP   1 year ago Encounter for general adult medical examination with abnormal findings   [facility] [place], Coralie Keens, NP   1 year ago [facility] [place], Coralie Keens, NP   1 year ago Acute pain of right knee   [facility], Lupita Raider, [place]

## 2022-05-22 ENCOUNTER — Encounter: Payer: Self-pay | Admitting: Internal Medicine

## 2022-05-22 ENCOUNTER — Ambulatory Visit (INDEPENDENT_AMBULATORY_CARE_PROVIDER_SITE_OTHER): Payer: 59 | Admitting: Internal Medicine

## 2022-05-22 VITALS — BP 128/82 | HR 101 | Temp 96.9°F | Wt 137.0 lb

## 2022-05-22 DIAGNOSIS — Z0001 Encounter for general adult medical examination with abnormal findings: Secondary | ICD-10-CM | POA: Diagnosis not present

## 2022-05-22 DIAGNOSIS — K219 Gastro-esophageal reflux disease without esophagitis: Secondary | ICD-10-CM

## 2022-05-22 DIAGNOSIS — Z3040 Encounter for surveillance of contraceptives, unspecified: Secondary | ICD-10-CM

## 2022-05-22 DIAGNOSIS — R109 Unspecified abdominal pain: Secondary | ICD-10-CM

## 2022-05-22 DIAGNOSIS — R252 Cramp and spasm: Secondary | ICD-10-CM

## 2022-05-22 DIAGNOSIS — J0100 Acute maxillary sinusitis, unspecified: Secondary | ICD-10-CM | POA: Diagnosis not present

## 2022-05-22 MED ORDER — OMEPRAZOLE 20 MG PO CPDR: 20.0000 mg | DELAYED_RELEASE_CAPSULE | Freq: Two times a day (BID) | ORAL | 1 refills | Status: DC

## 2022-05-22 MED ORDER — DICYCLOMINE HCL 20 MG PO TABS: 20.0000 mg | ORAL_TABLET | Freq: Three times a day (TID) | ORAL | 1 refills | Status: DC | PRN

## 2022-05-22 MED ORDER — BUSPIRONE HCL 10 MG PO TABS: 20.0000 mg | ORAL_TABLET | Freq: Two times a day (BID) | ORAL | 1 refills | Status: DC

## 2022-05-22 MED ORDER — ELINEST 0.3-30 MG-MCG PO TABS: 1.0000 | ORAL_TABLET | Freq: Every day | ORAL | 1 refills | Status: DC

## 2022-05-22 MED ORDER — DOXYCYCLINE HYCLATE 100 MG PO TABS: 100.0000 mg | ORAL_TABLET | Freq: Two times a day (BID) | ORAL | 0 refills | Status: DC

## 2022-05-22 NOTE — Progress Notes (Unsigned)
Subjective:    Patient ID: Cindy Bonilla, female    DOB: Nov 04, 1980, 41 y.o.   MRN: [id number]  HPI  Patient presents to clinic today with complaint of left cramps.  This started a few months ago. She reports the right seems worse than the left. She reports this occurs every day. She has not noticed any swelling. She does have a history of sciatica and reports numbness and tingling in the RLE. She does not smoke. She does drink lots of water. She has tried vinegar with some relief of symptoms.  She also reports headache, sinus pressure, ear pain, runny nose, nasal congestion and cough. This started 1 week ago.  The headache is located in her forehead. She describes the pain as pressure. She is blowing yellow/green mucous out of her nose. The cough is productive of yellow/green mucous. She denies sore throat, shortness of breath, nausea, vomiting or diarrhea. She denies fever, chills or body aches. She has tried Copywriter, advertising, Dayquil and Mucinex with minimal relief of symptoms. She has had sick contacts with similar symptoms. She has not taken a home covid test.   Review of Systems     Past Medical History:  Diagnosis Date   Anxiety    Arthritis    Depression    Urinary urgency     Current Outpatient Medications  Medication Sig Dispense Refill   busPIRone (BUSPAR) 10 MG tablet Take 2 tablets by mouth twice daily 360 tablet 3   dicyclomine (BENTYL) 20 MG tablet TAKE 1 TABLET BY MOUTH THREE TIMES DAILY AS NEEDED FOR SPASMS 90 tablet 0   ELINEST 0.3-30 MG-MCG tablet Take 1 tablet by mouth once daily 84 tablet 0   omeprazole (PRILOSEC) 20 MG capsule TAKE 1 CAPSULE BY MOUTH TWICE DAILY BEFORE A MEAL 180 capsule 0   No current facility-administered medications for this visit.    Allergies  Allergen Reactions   Latex Rash and Hives   Amoxicillin    Motrin [Ibuprofen] Other (See Comments)    Rectal bleeding.    Family History  Problem Relation Age of Onset   Diabetes Mother     Depression Mother    Aneurysm Brother    Other Father        "some rare disease"   Bladder Cancer Neg Hx    Prostate cancer Neg Hx    Kidney cancer Neg Hx     Social History   Socioeconomic History   Marital status: Significant Other    Spouse name: Not on file   Number of children: Not on file   Years of education: Not on file   Highest education level: Not on file  Occupational History   Not on file  Tobacco Use   Smoking status: Former    Types: Cigarettes    Quit date: 09/15/2004    Years since quitting: 17.6   Smokeless tobacco: Never  Vaping Use   Vaping Use: Former   Substances: Flavoring  Substance and Sexual Activity   Alcohol use: Yes    Comment: occasional   Drug use: No   Sexual activity: Not on file  Other Topics Concern   Not on file  Social History Narrative   Not on file   Social Determinants of Health   Financial Resource Strain: Not on file  Food Insecurity: Not on file  Transportation Needs: Not on file  Physical Activity: Not on file  Stress: Not on file  Social Connections: Not on file  Intimate Partner  Violence: Not on file     Constitutional: Pt reports headache. Denies fever, malaise, fatigue, or abrupt weight changes.  HEENT: Pt reports sinus pressure, runny nose, nasal congestion. Denies eye pain, eye redness, ear pain, ringing in the ears, wax buildup, bloody nose, or sore throat. Respiratory: Pt reports cough. Denies difficulty breathing, shortness of breath, or sputum production.   Cardiovascular: Denies chest pain, chest tightness, palpitations or swelling in the hands or feet.  Gastrointestinal: Denies abdominal pain, bloating, constipation, diarrhea or blood in the stool.  GU: Denies urgency, frequency, pain with urination, burning sensation, blood in urine, odor or discharge. Musculoskeletal: Patient reports leg cramps.  Denies decrease in range of motion, difficulty with gait, or joint pain and swelling.  Skin: Denies redness,  rashes, lesions or ulcercations.  Neurological: Denies dizziness, difficulty with memory, difficulty with speech or problems with balance and coordination.  Psych: Denies anxiety, depression, SI/HI.  No other specific complaints in a complete review of systems (except as listed in HPI above).  Objective:   Physical Exam  BP 128/82 (BP Location: Left Arm, Patient Position: Sitting, Cuff Size: Normal)   Pulse (!) 101   Temp (!) 96.9 F (36.1 C) (Temporal)   Wt 137 lb (62.1 kg)   SpO2 96%   BMI 25.06 kg/m   Wt Readings from Last 3 Encounters:  03/27/22 134 lb (60.8 kg)  07/31/21 156 lb (70.8 kg)  04/11/21 162 lb 3.2 oz (73.6 kg)    General: Appears her stated age, overweight, in NAD. Skin: Warm, dry and intact. No ulcerations noted. HEENT: Head: normal shape and size, maxillary sinus tenderness noted; Eyes: sclera white, no icterus, conjunctiva pink, PERRLA and EOMs intact; Ears: Tm's gray and intact, normal light reflex; Throat/Mouth: Teeth present, mucosa pink and moist, + PND, no exudate, lesions or ulcerations noted.  Neck:  No adenopathy noted. Cardiovascular: Normal rate and rhythm. S1,S2 noted.  No murmur, rubs or gallops noted.  Pulmonary/Chest: Normal effort and positive vesicular breath sounds. No respiratory distress. No wheezes, rales or ronchi noted.  Musculoskeletal: No swelling noted of the lower extremities. Strength 5/5 BLE. No difficulty with gait.  Neurological: Alert and oriented.    BMET    Component Value Date/Time   NA 139 04/11/2021 1447   NA 142 10/18/2015 1047   K 4.1 04/11/2021 1447   CL 105 04/11/2021 1447   CO2 25 04/11/2021 1447   GLUCOSE 117 04/11/2021 1447   BUN 8 04/11/2021 1447   BUN 6 10/18/2015 1047   CREATININE 0.87 04/11/2021 1447   CALCIUM 9.0 04/11/2021 1447   GFRNONAA 70 12/05/2020 0955   GFRAA 81 12/05/2020 0955    Lipid Panel     Component Value Date/Time   CHOL 195 04/11/2021 1447   CHOL 156 10/18/2015 1047   TRIG 110  04/11/2021 1447   HDL 64 04/11/2021 1447   HDL 48 10/18/2015 1047   CHOLHDL 3.0 04/11/2021 1447   LDLCALC 109 (H) 04/11/2021 1447    CBC    Component Value Date/Time   WBC 7.6 04/11/2021 1447   RBC 4.38 04/11/2021 1447   HGB 13.5 04/11/2021 1447   HCT 40.7 04/11/2021 1447   PLT 253 04/11/2021 1447   MCV 92.9 04/11/2021 1447   MCH 30.8 04/11/2021 1447   MCHC 33.2 04/11/2021 1447   RDW 12.1 04/11/2021 1447   LYMPHSABS 2,760 09/29/2019 0850   EOSABS 218 09/29/2019 0850   BASOSABS 30 09/29/2019 0850    Hgb A1C Lab Results  Component Value Date   HGBA1C 5.4 04/11/2021           Assessment & Plan:   Muscle Cramps in Legs:  We will check CBC, c-Met and magnesium Encourage adequate water intake She already takes a muscle relaxer, could consider switching this to something like Zanaflex or Cyclobenzaprine Encourage daily stretching  Acute Maxillary Sinusitis:  Encouraged her to use a Nettie pot which can be purchased from your local pharmacy Start Zyrtec and Flonase OTC Rx for Doxycycline 100 mg twice daily x10 days   RTC in 5 months for follow-up of chronic conditions Webb Silversmith, NP

## 2022-05-22 NOTE — Patient Instructions (Signed)

## 2022-05-23 LAB — COMPLETE METABOLIC PANEL WITH GFR
AG Ratio: 1.8 (calc) (ref 1.0–2.5)
ALT: 13 U/L (ref 6–29)
AST: 18 U/L (ref 10–30)
Albumin: 4.3 g/dL (ref 3.6–5.1)
Alkaline phosphatase (APISO): 43 U/L (ref 31–125)
BUN/Creatinine Ratio: 11 (calc) (ref 6–22)
BUN: 11 mg/dL (ref 7–25)
CO2: 24 mmol/L (ref 20–32)
Calcium: 9.1 mg/dL (ref 8.6–10.2)
Chloride: 103 mmol/L (ref 98–110)
Creat: 1 mg/dL — ABNORMAL HIGH (ref 0.50–0.99)
Globulin: 2.4 g/dL (calc) (ref 1.9–3.7)
Glucose, Bld: 163 mg/dL — ABNORMAL HIGH (ref 65–99)
Potassium: 4.3 mmol/L (ref 3.5–5.3)
Sodium: 136 mmol/L (ref 135–146)
Total Bilirubin: 0.5 mg/dL (ref 0.2–1.2)
Total Protein: 6.7 g/dL (ref 6.1–8.1)
eGFR: 72 mL/min/{1.73_m2} (ref >=60)

## 2022-05-23 LAB — LIPID PANEL
Cholesterol: 161 mg/dL (ref <200)
HDL: 67 mg/dL (ref >=50)
LDL Cholesterol (Calc): 77 mg/dL (calc)
Non-HDL Cholesterol (Calc): 94 mg/dL (calc) (ref <130)
Total CHOL/HDL Ratio: 2.4 (calc) (ref <5.0)
Triglycerides: 90 mg/dL (ref <150)

## 2022-05-23 LAB — CBC
HCT: 38.9 % (ref 35.0–45.0)
Hemoglobin: 13.4 g/dL (ref 11.7–15.5)
MCH: 31.8 pg (ref 27.0–33.0)
MCHC: 34.4 g/dL (ref 32.0–36.0)
MCV: 92.4 fL (ref 80.0–100.0)
MPV: 10.9 fL (ref 7.5–12.5)
Platelets: 237 10*3/uL (ref 140–400)
RBC: 4.21 10*6/uL (ref 3.80–5.10)
RDW: 11.8 % (ref 11.0–15.0)
WBC: 6.1 10*3/uL (ref 3.8–10.8)

## 2022-05-23 LAB — MAGNESIUM: Magnesium: 1.9 mg/dL (ref 1.5–2.5)

## 2022-06-12 ENCOUNTER — Ambulatory Visit
Admission: RE | Admit: 2022-06-12 | Discharge: 2022-06-12 | Disposition: A | Discharge status: Home / Self Care | Payer: 59 | Source: Ambulatory Visit | Attending: Internal Medicine | Admitting: Internal Medicine

## 2022-06-12 DIAGNOSIS — Z1231 Encounter for screening mammogram for malignant neoplasm of breast: Secondary | ICD-10-CM | POA: Insufficient documentation

## 2022-06-13 DIAGNOSIS — M5136 Other intervertebral disc degeneration, lumbar region: Secondary | ICD-10-CM | POA: Diagnosis not present

## 2022-06-13 DIAGNOSIS — S7421XA Injury of cutaneous sensory nerve at hip and high level, right leg, initial encounter: Secondary | ICD-10-CM | POA: Diagnosis not present

## 2022-06-13 DIAGNOSIS — M5441 Lumbago with sciatica, right side: Secondary | ICD-10-CM | POA: Diagnosis not present

## 2022-06-13 DIAGNOSIS — G8929 Other chronic pain: Secondary | ICD-10-CM | POA: Diagnosis not present

## 2022-06-13 DIAGNOSIS — S7011XA Contusion of right thigh, initial encounter: Secondary | ICD-10-CM | POA: Diagnosis not present

## 2022-06-14 ENCOUNTER — Encounter: Payer: Self-pay | Admitting: Internal Medicine

## 2022-06-15 MED ORDER — TIZANIDINE HCL 4 MG PO CAPS: 4.0000 mg | ORAL_CAPSULE | Freq: Every day | ORAL | 0 refills | Status: DC | PRN

## 2022-07-02 ENCOUNTER — Ambulatory Visit (INDEPENDENT_AMBULATORY_CARE_PROVIDER_SITE_OTHER): Payer: 59 | Admitting: Internal Medicine

## 2022-07-02 ENCOUNTER — Encounter: Payer: Self-pay | Admitting: Internal Medicine

## 2022-07-02 VITALS — BP 124/80 | HR 94 | Temp 97.3°F | Wt 131.0 lb

## 2022-07-02 DIAGNOSIS — R42 Dizziness and giddiness: Secondary | ICD-10-CM | POA: Diagnosis not present

## 2022-07-02 DIAGNOSIS — R202 Paresthesia of skin: Secondary | ICD-10-CM

## 2022-07-02 DIAGNOSIS — R519 Headache, unspecified: Secondary | ICD-10-CM | POA: Diagnosis not present

## 2022-07-02 DIAGNOSIS — R29898 Other symptoms and signs involving the musculoskeletal system: Secondary | ICD-10-CM | POA: Diagnosis not present

## 2022-07-02 NOTE — Patient Instructions (Signed)
Multiple Sclerosis Multiple sclerosis (MS) is a disease of the brain, spinal cord, and optic nerves (central nervous system). It causes the body's disease-fighting system (immunesystem) to destroy the protective covering around nerves in the brain (myelin sheath). When this happens, signals (nerve impulses) going to and from the brain and spinal cord do not get sent properly or may not get sent at all. There are several types of MS: Relapsing-remitting MS. This is the most common type. This causes sudden attacks of symptoms. After an attack, you may recover completely until the next attack, or some symptoms may remain permanently. Secondary progressive MS. This usually develops after the onset of relapsing-remitting MS. Similar to relapsing-remitting MS, this type also causes sudden attacks of symptoms. Attacks may be less frequent, but symptoms slowly get worse over time. Primary progressive MS. This causes symptoms that steadily progress over time. This type of MS does not cause sudden attacks of symptoms. The age of onset of MS varies, but it often develops between 20 and 40 years of age. MS is a lifelong (chronic) condition. There is no cure, but treatment can help slow down the progression of the disease. What are the causes? The cause of this condition is not known. What increases the risk? You are more likely to develop this condition if: You are a woman. You have a relative with MS. However, the condition is not passed from parent to child (inherited). You have a lack (deficiency) of vitamin D. You smoke. MS is more common in the [place] than in the [place]. What are the signs or symptoms? Relapsing-remitting and secondary progressive MS cause symptoms to occur in episodes or attacks that may last weeks to months. There may be long periods between attacks in which there are almost no symptoms. Primary progressive MS causes symptoms to steadily progress after  they develop. Symptoms of MS vary because of the many different ways it affects the central nervous system. The main symptoms include: Vision problems and eye pain. Numbness and weakness. Inability to move your arms, hands, feet, or legs (paralysis). Balance problems. Shaking that you cannot control (tremors). Sudden muscle tightening (spasms). Problems with thinking (cognitive changes). MS can also cause symptoms that are associated with the disease but are not always the direct result of an MS attack. They may include: Inability to control when you urinate or have bowel movements (incontinence). Headaches. Fatigue. Inability to tolerate heat. Emotional changes. Depression. Pain. How is this diagnosed? This condition is diagnosed based on: Your symptoms. A neurological exam. This involves checking your central nervous system function, such as nerve function, reflexes, and coordination. MRIs of the brain and spinal cord. Lab tests, including a lumbar puncture that tests the fluid that surrounds the brain and spinal cord (cerebrospinal fluid). Tests to measure the electrical activity of the brain in response to stimulation (evoked potentials). How is this treated? There is no cure for MS, but medicines can help decrease the number and frequency of attacks and help relieve nuisance symptoms. Treatment options may include: Medicines that: Reduce the frequency of attacks. These medicines may be given by injection, by mouth (orally), or through an IV. Reduce inflammation (steroids). These may provide short-term relief of symptoms. Help control pain, depression, fatigue, or incontinence. Nutritional counseling. Eating a healthy, balanced diet can help with symptoms. Taking vitamin D supplements, if you have a deficiency. Using devices to help you move around (assistive devices), such as braces, a cane, or a walker. Therapy, such   as: Physical therapy to strengthen and stretch your  muscles. Occupational therapy to help you with everyday tasks. Alternative or complementary treatments such as massage or acupuncture. Low-impact, mild exercises, such as swimming, walking, and yoga. Regular exercise can help alleviate symptoms and increase strength and balance. Follow these instructions at home: Medicines Take over-the-counter and prescription medicines only as told by your health care provider. Ask your health care provider if the medicine prescribed to you requires you to avoid driving or using machinery. Activity Use assistive devices as recommended by your physical therapist or your health care provider. Exercise as directed by your health care provider. Return to your normal activities as told by your health care provider. Ask your health care provider what activities are safe for you. General instructions Eating healthy can help manage MS symptoms. Reach out for support. Share your feelings with friends, family, or a support group. Keep all follow-up visits. This is important. Where to find more information National Multiple Sclerosis Society: www.nationalmssociety.org National Institute of Neurological Disorders and Stroke: www.ninds.nih.gov National Center for Complementary and Integrative Health: www.nccih.nih.gov Contact a health care provider if: You feel depressed. You develop new pain or numbness. You have tremors. You have problems with sexual function. Get help right away if: You develop paralysis. You develop numbness. You have problems with your bladder or bowel function. You develop double vision. You lose vision in one or both eyes. You develop suicidal thoughts. You develop severe confusion. Get help right away if you feel like you may hurt yourself or others, or have thoughts about taking your own life. Go to your nearest emergency room or: Call 911. Call the National Suicide Prevention Lifeline at 1-800-273-8255 or 988. This is open 24 hours  a day. Text the Crisis Text Line at 741741. Summary Multiple sclerosis (MS) is a disease of the central nervous system that causes the body's immune system to destroy the protective covering around nerves in the brain (myelin sheath). There are 3 types of MS: relapsing-remitting, secondary progressive, and primary progressive. There is no cure for MS, but medicines can help decrease the number and frequency of attacks and help relieve nuisance symptoms. Treatment may also include physical or occupational therapy. If you develop numbness, paralysis, vision problems, or other neurological symptoms, get help right away. This information is not intended to replace advice given to you by your health care provider. Make sure you discuss any questions you have with your health care provider. Document Revised: 02/15/2021 Document Reviewed: 02/15/2021 Elsevier Patient Education  2023 Elsevier Inc.  

## 2022-07-02 NOTE — Progress Notes (Signed)
Subjective:    Patient ID: Cindy Bonilla, female    DOB: 1980-12-20, 42 y.o.   MRN: [id number]  HPI  Patient presents to clinic today requesting to be worked up for MS. She reports intermittent sharp pains on the right side of body. She has intermittent sharp pains in her head and face. She reports her right upper and lower extremity will "go limp" at times. She has paresthesia of the right side of her face, right upper extremity and right lower extremity.  She was recently seen by orthopedics for low back pain with sciatica. She has had multiple steroid injections and undergone PT for the same. She is unsure if MS runs in her family or not.  Review of Systems  Past Medical History:  Diagnosis Date   Anxiety    Arthritis    Depression    Urinary urgency     Current Outpatient Medications  Medication Sig Dispense Refill   busPIRone (BUSPAR) 10 MG tablet Take 2 tablets (20 mg total) by mouth 2 (two) times daily. 360 tablet 1   dicyclomine (BENTYL) 20 MG tablet Take 1 tablet (20 mg total) by mouth 3 (three) times daily as needed for spasms. 90 tablet 1   doxycycline (VIBRA-TABS) 100 MG tablet Take 1 tablet (100 mg total) by mouth 2 (two) times daily. 20 tablet 0   norgestrel-ethinyl estradiol (ELINEST) 0.3-30 MG-MCG tablet Take 1 tablet by mouth daily. 84 tablet 1   omeprazole (PRILOSEC) 20 MG capsule Take 1 capsule (20 mg total) by mouth 2 (two) times daily before a meal. 180 capsule 1   tiZANidine (ZANAFLEX) 4 MG capsule Take 1 capsule (4 mg total) by mouth daily as needed for muscle spasms. 30 capsule 0   No current facility-administered medications for this visit.    Allergies  Allergen Reactions   Latex Rash and Hives   Amoxicillin    Motrin [Ibuprofen] Other (See Comments)    Rectal bleeding.    Family History  Problem Relation Age of Onset   Diabetes Mother    Depression Mother    Aneurysm Brother    Other Father        "some rare disease"   Bladder Cancer Neg Hx     Prostate cancer Neg Hx    Kidney cancer Neg Hx     Social History   Socioeconomic History   Marital status: Significant Other    Spouse name: Not on file   Number of children: Not on file   Years of education: Not on file   Highest education level: Not on file  Occupational History   Not on file  Tobacco Use   Smoking status: Former    Types: Cigarettes    Quit date: 09/15/2004    Years since quitting: 17.8   Smokeless tobacco: Never  Vaping Use   Vaping Use: Former   Substances: Flavoring  Substance and Sexual Activity   Alcohol use: Yes    Comment: occasional   Drug use: No   Sexual activity: Not on file  Other Topics Concern   Not on file  Social History Narrative   Not on file   Social Determinants of Health   Financial Resource Strain: Not on file  Food Insecurity: Not on file  Transportation Needs: Not on file  Physical Activity: Not on file  Stress: Not on file  Social Connections: Not on file  Intimate Partner Violence: Not on file     Constitutional: Patient reports intermittent headache.  Denies fever, malaise, fatigue, or abrupt weight changes.  HEENT: Denies eye pain, eye redness, ear pain, ringing in the ears, wax buildup, runny nose, nasal congestion, bloody nose, or sore throat. Respiratory: Denies difficulty breathing, shortness of breath, cough or sputum production.   Cardiovascular: Denies chest pain, chest tightness, palpitations or swelling in the hands or feet.  Musculoskeletal: Patient reports right arm and right lower extremity weakness.  Denies decrease in range of motion, difficulty with gait, muscle pain or joint pain and swelling.  Skin: Denies redness, rashes, lesions or ulcercations.  Neurological: Patient reports paresthesia of right side of face, right upper extremity and right lower extremity, intermittent dizziness.  Denies difficulty with memory, difficulty with speech or problems with balance and coordination.    No other  specific complaints in a complete review of systems (except as listed in HPI above).     Objective:   Physical Exam  BP 124/80 (BP Location: Right Arm, Patient Position: Sitting, Cuff Size: Normal)   Pulse 94   Temp (!) 97.3 F (36.3 C) (Temporal)   Wt 131 lb (59.4 kg)   LMP 06/12/2022   SpO2 99%   BMI 23.96 kg/m   Wt Readings from Last 3 Encounters:  05/22/22 137 lb (62.1 kg)  03/27/22 134 lb (60.8 kg)  07/31/21 156 lb (70.8 kg)    General: Appears her stated age, well developed, well nourished in NAD. Skin: Warm, dry and intact.  HEENT: Head: normal shape and size; Eyes: sclera white, no icterus, conjunctiva pink, PERRLA and EOMs intact;  Cardiovascular: Normal rate and rhythm. Pulmonary/Chest: Normal effort and positive vesicular breath sounds. No respiratory distress. No wheezes, rales or ronchi noted.  Musculoskeletal: Shoulder shrug equal.  Strength 5/5 BUE/BLE.  Handgrips equal.  No difficulty with gait.  Neurological: Alert and oriented. Coordination normal.     BMET    Component Value Date/Time   NA 136 05/22/2022 1601   NA 142 10/18/2015 1047   K 4.3 05/22/2022 1601   CL 103 05/22/2022 1601   CO2 24 05/22/2022 1601   GLUCOSE 163 (H) 05/22/2022 1601   BUN 11 05/22/2022 1601   BUN 6 10/18/2015 1047   CREATININE 1.00 (H) 05/22/2022 1601   CALCIUM 9.1 05/22/2022 1601   GFRNONAA 70 12/05/2020 0955   GFRAA 81 12/05/2020 0955    Lipid Panel     Component Value Date/Time   CHOL 161 05/22/2022 1601   CHOL 156 10/18/2015 1047   TRIG 90 05/22/2022 1601   HDL 67 05/22/2022 1601   HDL 48 10/18/2015 1047   CHOLHDL 2.4 05/22/2022 1601   LDLCALC 77 05/22/2022 1601    CBC    Component Value Date/Time   WBC 6.1 05/22/2022 1601   RBC 4.21 05/22/2022 1601   HGB 13.4 05/22/2022 1601   HCT 38.9 05/22/2022 1601   PLT 237 05/22/2022 1601   MCV 92.4 05/22/2022 1601   MCH 31.8 05/22/2022 1601   MCHC 34.4 05/22/2022 1601   RDW 11.8 05/22/2022 1601   LYMPHSABS  2,760 09/29/2019 0850   EOSABS 218 09/29/2019 0850   BASOSABS 30 09/29/2019 0850    Hgb A1C Lab Results  Component Value Date   HGBA1C 5.4 04/11/2021            Assessment & Plan:   Headaches, Dizziness, Paresthesia of Face, Weakness of Right Upper and Lower Extremity:  Will obtain MRI of brain Consider referral to neurology for further evaluation  RTC in 3 months, follow-up chronic conditions  Webb Silversmith, NP

## 2022-07-06 ENCOUNTER — Ambulatory Visit: Admission: RE | Admit: 2022-07-06 | Payer: 59 | Source: Ambulatory Visit

## 2022-07-08 ENCOUNTER — Other Ambulatory Visit: Payer: Self-pay | Admitting: Internal Medicine

## 2022-07-08 DIAGNOSIS — R252 Cramp and spasm: Secondary | ICD-10-CM

## 2022-07-09 NOTE — Telephone Encounter (Signed)
Requested Prescriptions  Pending Prescriptions Disp Refills   dicyclomine (BENTYL) 20 MG tablet [Pharmacy Med Name: DICYCLOMINE 20 MG TABLET] 90 tablet 1    Sig: TAKE 1 TABLET (20 MG TOTAL) BY MOUTH 3 (THREE) TIMES DAILY AS NEEDED FOR SPASMS.     Gastroenterology:  Antispasmodic Agents Passed - 07/08/2022  8:18 PM      Passed - Valid encounter within last 12 months    Recent Outpatient Visits           1 week ago Paresthesia of right upper and lower extremity   [place], NP   1 month ago Muscle cramps   [facility] [place], Coralie Keens, NP   3 months ago Encounter for general adult medical examination with abnormal findings   [facility] [place], Coralie Keens, NP   11 months ago Anxiety and depression   [place], NP   1 year ago Encounter for general adult medical examination with abnormal findings   [facility] [place], Coralie Keens, NP

## 2022-07-17 ENCOUNTER — Telehealth: Payer: 59 | Admitting: Physician Assistant

## 2022-07-17 ENCOUNTER — Other Ambulatory Visit: Payer: Self-pay | Admitting: Internal Medicine

## 2022-07-17 DIAGNOSIS — J019 Acute sinusitis, unspecified: Secondary | ICD-10-CM

## 2022-07-17 MED ORDER — BENZONATATE 100 MG PO CAPS: 100.0000 mg | ORAL_CAPSULE | Freq: Three times a day (TID) | ORAL | 0 refills | Status: DC | PRN

## 2022-07-17 MED ORDER — DOXYCYCLINE HYCLATE 100 MG PO TABS: 100.0000 mg | ORAL_TABLET | Freq: Two times a day (BID) | ORAL | 0 refills | Status: DC

## 2022-07-17 MED ORDER — DOXYCYCLINE HYCLATE 100 MG PO TABS: 100.0000 mg | ORAL_TABLET | Freq: Two times a day (BID) | ORAL | 0 refills | Status: AC

## 2022-07-17 NOTE — Telephone Encounter (Signed)
Requested medication (s) are due for refill today:   Provider to review  Requested medication (s) are on the active medication list:   Yes  Future visit scheduled:   No  Seen 2 wks ago   Last ordered: 06/15/2022 #30, 0 refills  Non delegated refill   Requested Prescriptions  Pending Prescriptions Disp Refills   tiZANidine (ZANAFLEX) 4 MG tablet [Pharmacy Med Name: TIZANIDINE HCL 4 MG TABLET] 30 tablet     Sig: TAKE 1 CAPSULE (4 MG TOTAL) BY MOUTH DAILY AS NEEDED FOR MUSCLE SPASMS.     Not Delegated - Cardiovascular:  Alpha-2 Agonists - tizanidine Failed - 07/17/2022  1:04 AM      Failed - This refill cannot be delegated      Passed - Valid encounter within last 6 months    Recent Outpatient Visits           2 weeks ago Paresthesia of right upper and lower extremity   [facility] [place], Coralie Keens, NP   1 month ago Muscle cramps   [facility] [place], Coralie Keens, NP   3 months ago Encounter for general adult medical examination with abnormal findings   [facility] [place], Coralie Keens, NP   11 months ago Anxiety and depression   [facility] [place], Coralie Keens, NP   1 year ago Encounter for general adult medical examination with abnormal findings   [facility] [place], Coralie Keens, NP

## 2022-07-17 NOTE — Progress Notes (Signed)
Patient no-showed today's appointment; appointment was for Video Urgent Care Visit.  

## 2022-07-17 NOTE — Progress Notes (Signed)

## 2022-08-13 ENCOUNTER — Other Ambulatory Visit: Payer: Self-pay | Admitting: Internal Medicine

## 2022-08-14 NOTE — Telephone Encounter (Signed)
Requested medication (s) are due for refill today: yes  Requested medication (s) are on the active medication list: yes  Last refill:  07/17/22 #30 0 refills  Future visit scheduled: no   Notes to clinic:  not delegated per protocol. Do you want to refill Rx?     Requested Prescriptions  Pending Prescriptions Disp Refills   tiZANidine (ZANAFLEX) 4 MG tablet [Pharmacy Med Name: TIZANIDINE HCL 4 MG TABLET] 30 tablet 0    Sig: TAKE 1 CAPSULE (4 MG TOTAL) BY MOUTH DAILY AS NEEDED FOR MUSCLE SPASMS.     Not Delegated - Cardiovascular:  Alpha-2 Agonists - tizanidine Failed - 08/13/2022  1:21 AM      Failed - This refill cannot be delegated      Passed - Valid encounter within last 6 months    Recent Outpatient Visits           1 month ago Paresthesia of right upper and lower extremity   [facility] [place], Coralie Keens, NP   2 months ago Muscle cramps   [facility] [place], Coralie Keens, NP   4 months ago Encounter for general adult medical examination with abnormal findings   [facility] [place], Coralie Keens, NP   1 year ago Anxiety and depression   [facility] [place], Coralie Keens, NP   1 year ago Encounter for general adult medical examination with abnormal findings   [facility] [place], Coralie Keens, NP

## 2022-08-21 ENCOUNTER — Ambulatory Visit: Payer: 59

## 2022-08-21 ENCOUNTER — Encounter: Payer: 59 | Admitting: Internal Medicine

## 2022-08-21 NOTE — Progress Notes (Deleted)
   Subjective:    Patient ID: Cindy Bonilla, female    DOB: 07-Feb-1981, 42 y.o.   MRN: [id number]  HPI    Review of Systems     Objective:   Physical Exam        Assessment & Plan:

## 2022-08-23 ENCOUNTER — Ambulatory Visit: Payer: 59

## 2022-09-11 ENCOUNTER — Other Ambulatory Visit: Payer: Self-pay | Admitting: Internal Medicine

## 2022-09-11 DIAGNOSIS — R252 Cramp and spasm: Secondary | ICD-10-CM

## 2022-09-11 NOTE — Telephone Encounter (Signed)
Requested Prescriptions  Pending Prescriptions Disp Refills   dicyclomine (BENTYL) 20 MG tablet [Pharmacy Med Name: DICYCLOMINE 20 MG TABLET] 90 tablet 1    Sig: TAKE 1 TABLET BY MOUTH THREE TIMES DAILY AS NEEDED FOR SPASMS.     Gastroenterology:  Antispasmodic Agents Passed - 09/11/2022  1:42 AM      Passed - Valid encounter within last 12 months    Recent Outpatient Visits           2 months ago Paresthesia of right upper and lower extremity   [facility] [place], Coralie Keens, NP   3 months ago Muscle cramps   [facility] [place], Coralie Keens, NP   5 months ago Encounter for general adult medical examination with abnormal findings   [facility] [place], Coralie Keens, NP   1 year ago Anxiety and depression   [facility] [place], Coralie Keens, NP   1 year ago Encounter for general adult medical examination with abnormal findings   [facility] [place], Coralie Keens, NP

## 2022-09-12 ENCOUNTER — Other Ambulatory Visit: Payer: Self-pay | Admitting: Internal Medicine

## 2022-09-12 NOTE — Telephone Encounter (Signed)
Requested medication (s) are due for refill today: yes  Requested medication (s) are on the active medication list: yes  Last refill:  08/14/22 #30 0 refills  Future visit scheduled: no  Notes to clinic:   not delegated per protocol. Do you want to refill?     Requested Prescriptions  Pending Prescriptions Disp Refills   tiZANidine (ZANAFLEX) 4 MG tablet [Pharmacy Med Name: TIZANIDINE HCL 4 MG TABLET] 30 tablet 0    Sig: TAKE 1 CAPSULE (4 MG TOTAL) BY MOUTH DAILY AS NEEDED FOR MUSCLE SPASMS.     Not Delegated - Cardiovascular:  Alpha-2 Agonists - tizanidine Failed - 09/12/2022  1:54 AM      Failed - This refill cannot be delegated      Passed - Valid encounter within last 6 months    Recent Outpatient Visits           2 months ago Paresthesia of right upper and lower extremity   [facility] [place], Coralie Keens, NP   3 months ago Muscle cramps   [facility] [place], Coralie Keens, NP   5 months ago Encounter for general adult medical examination with abnormal findings   [facility] [place], Coralie Keens, NP   1 year ago Anxiety and depression   [facility] [place], Coralie Keens, NP   1 year ago Encounter for general adult medical examination with abnormal findings   [facility] [place], Coralie Keens, NP

## 2022-10-13 ENCOUNTER — Other Ambulatory Visit: Payer: Self-pay | Admitting: Internal Medicine

## 2022-10-15 NOTE — Telephone Encounter (Signed)
Requested medication (s) are due for refill today: yes  Requested medication (s) are on the active medication list: yes  Last refill:  09/13/22 #30/0  Future visit scheduled: no  Notes to clinic:  Unable to refill per protocol, cannot delegate.    Requested Prescriptions  Pending Prescriptions Disp Refills   tiZANidine (ZANAFLEX) 4 MG tablet [Pharmacy Med Name: TIZANIDINE HCL 4 MG TABLET] 30 tablet 0    Sig: TAKE 1 CAPSULE (4 MG TOTAL) BY MOUTH DAILY AS NEEDED FOR MUSCLE SPASMS.     Not Delegated - Cardiovascular:  Alpha-2 Agonists - tizanidine Failed - 10/13/2022  8:52 AM      Failed - This refill cannot be delegated      Passed - Valid encounter within last 6 months    Recent Outpatient Visits           3 months ago Paresthesia of right upper and lower extremity   [place] [facility] [place], Salvadore [place], NP   4 months ago Muscle cramps   [facility] [place], Salvadore [place], NP   6 months ago Encounter for general adult medical examination with abnormal findings   [place] [facility] [place], Salvadore Oxford, NP   1 year ago Anxiety and depression   [place] [facility] [place], Salvadore Oxford, NP   1 year ago Encounter for general adult medical examination with abnormal findings    [facility] [place], Salvadore Oxford, NP

## 2022-11-08 ENCOUNTER — Other Ambulatory Visit: Payer: Self-pay | Admitting: Internal Medicine

## 2022-11-08 DIAGNOSIS — R252 Cramp and spasm: Secondary | ICD-10-CM

## 2022-11-08 NOTE — Telephone Encounter (Signed)
Requested Prescriptions  Pending Prescriptions Disp Refills   omeprazole (PRILOSEC) 20 MG capsule [Pharmacy Med Name: OMEPRAZOLE DR 20 MG CAPSULE] 180 capsule 0    Sig: TAKE 1 CAPSULE (20 MG TOTAL) BY MOUTH 2 (TWO) TIMES DAILY BEFORE A MEAL.     Gastroenterology: Proton Pump Inhibitors Passed - 11/08/2022  2:26 AM      Passed - Valid encounter within last 12 months    Recent Outpatient Visits           4 months ago Paresthesia of right upper and lower extremity   [facility] [place], Salvadore Oxford, NP   5 months ago Muscle cramps   [facility] [place], Salvadore Oxford, NP   7 months ago Encounter for general adult medical examination with abnormal findings   [place] [place], Salvadore Oxford, NP   1 year ago Anxiety and depression   [place] [facility] [place], Salvadore Oxford, NP   1 year ago Encounter for general adult medical examination with abnormal findings   [place] [facility] [place], Salvadore Oxford, NP               busPIRone (BUSPAR) 10 MG tablet [Pharmacy Med Name: BUSPIRONE HCL 10 MG TABLET] 360 tablet 0    Sig: TAKE 2 TABLETS BY MOUTH 2 TIMES DAILY.     Psychiatry: Anxiolytics/Hypnotics - Non-controlled Passed - 11/08/2022  2:26 AM      Passed - Valid encounter within last 12 months    Recent Outpatient Visits           4 months ago Paresthesia of right upper and lower extremity   [place] [facility] [place], Salvadore Oxford, NP   5 months ago Muscle cramps   [place] [facility], Salvadore Oxford, NP   7 months ago Encounter for general adult medical examination with abnormal findings   [place] [facility] [place], Salvadore Oxford, NP   1 year ago Anxiety and depression    [facility] [place], Salvadore Oxford, NP   1 year ago Encounter for general adult medical examination with abnormal findings   [facility] [place], Salvadore Oxford, NP

## 2022-11-25 ENCOUNTER — Other Ambulatory Visit: Payer: Self-pay | Admitting: Internal Medicine

## 2022-11-25 DIAGNOSIS — R252 Cramp and spasm: Secondary | ICD-10-CM

## 2022-11-26 NOTE — Telephone Encounter (Signed)
Requested Prescriptions  Pending Prescriptions Disp Refills   LOW-OGESTREL 0.3-30 MG-MCG tablet [Pharmacy Med Name: LOW-OGESTREL-28 TABLET] 84 tablet 0    Sig: TAKE 1 TABLET BY MOUTH EVERY DAY     OB/GYN:  Contraceptives Passed - 11/25/2022  8:50 AM      Passed - Last BP in normal range    BP Readings from Last 1 Encounters:  07/02/22 124/80         Passed - Valid encounter within last 12 months    Recent Outpatient Visits           4 months ago Paresthesia of right upper and lower extremity   [facility] [place], Salvadore Oxford, NP   6 months ago Muscle cramps   [place] [facility] [place], Salvadore Oxford, NP   8 months ago Encounter for general adult medical examination with abnormal findings   [place] [facility] [place], Salvadore Oxford, NP   1 year ago Anxiety and depression   [place] [facility] [place], Salvadore Oxford, NP   1 year ago Encounter for general adult medical examination with abnormal findings   [place] [facility] [place], Salvadore Oxford, [place]              Passed - Patient is not a smoker

## 2022-12-21 DIAGNOSIS — M9902 Segmental and somatic dysfunction of thoracic region: Secondary | ICD-10-CM | POA: Diagnosis not present

## 2022-12-21 DIAGNOSIS — M5416 Radiculopathy, lumbar region: Secondary | ICD-10-CM | POA: Diagnosis not present

## 2022-12-21 DIAGNOSIS — M546 Pain in thoracic spine: Secondary | ICD-10-CM | POA: Diagnosis not present

## 2022-12-21 DIAGNOSIS — M9901 Segmental and somatic dysfunction of cervical region: Secondary | ICD-10-CM | POA: Diagnosis not present

## 2022-12-21 DIAGNOSIS — M9903 Segmental and somatic dysfunction of lumbar region: Secondary | ICD-10-CM | POA: Diagnosis not present

## 2022-12-21 DIAGNOSIS — M542 Cervicalgia: Secondary | ICD-10-CM | POA: Diagnosis not present

## 2023-01-04 DIAGNOSIS — M546 Pain in thoracic spine: Secondary | ICD-10-CM | POA: Diagnosis not present

## 2023-01-04 DIAGNOSIS — M542 Cervicalgia: Secondary | ICD-10-CM | POA: Diagnosis not present

## 2023-01-04 DIAGNOSIS — M5416 Radiculopathy, lumbar region: Secondary | ICD-10-CM | POA: Diagnosis not present

## 2023-01-04 DIAGNOSIS — M9903 Segmental and somatic dysfunction of lumbar region: Secondary | ICD-10-CM | POA: Diagnosis not present

## 2023-01-04 DIAGNOSIS — M9902 Segmental and somatic dysfunction of thoracic region: Secondary | ICD-10-CM | POA: Diagnosis not present

## 2023-01-04 DIAGNOSIS — M9901 Segmental and somatic dysfunction of cervical region: Secondary | ICD-10-CM | POA: Diagnosis not present

## 2023-01-09 ENCOUNTER — Other Ambulatory Visit: Payer: Self-pay | Admitting: Internal Medicine

## 2023-01-09 DIAGNOSIS — R252 Cramp and spasm: Secondary | ICD-10-CM

## 2023-01-10 NOTE — Telephone Encounter (Signed)
Requested Prescriptions  Pending Prescriptions Disp Refills   omeprazole (PRILOSEC) 20 MG capsule [Pharmacy Med Name: OMEPRAZOLE DR 20 MG CAPSULE] 180 capsule 0    Sig: TAKE 1 CAPSULE (20 MG TOTAL) BY MOUTH 2 (TWO) TIMES DAILY BEFORE A MEAL.     Gastroenterology: Proton Pump Inhibitors Passed - 01/09/2023  4:09 PM      Passed - Valid encounter within last 12 months    Recent Outpatient Visits           6 months ago Paresthesia of right upper and lower extremity   [place] [place], Salvadore Oxford, NP   7 months ago Muscle cramps   [facility] [place], Salvadore Oxford, NP   9 months ago Encounter for general adult medical examination with abnormal findings    [facility] [place], Salvadore Oxford, NP   1 year ago Anxiety and depression    [facility] [place], Salvadore Oxford, NP   1 year ago Encounter for general adult medical examination with abnormal findings   [facility] [place], Salvadore Oxford, NP

## 2023-01-13 ENCOUNTER — Other Ambulatory Visit: Payer: Self-pay | Admitting: Internal Medicine

## 2023-01-13 DIAGNOSIS — R252 Cramp and spasm: Secondary | ICD-10-CM

## 2023-01-15 NOTE — Telephone Encounter (Signed)
Unable to refill per protocol, Rx request is too soon. Last refill 11/26/22 for 84 days.  Requested Prescriptions  Pending Prescriptions Disp Refills   LOW-OGESTREL 0.3-30 MG-MCG tablet [Pharmacy Med Name: LOW-OGESTREL-28 TABLET] 84 tablet 1    Sig: TAKE 1 TABLET BY MOUTH EVERY DAY     OB/GYN:  Contraceptives Passed - 01/13/2023  1:31 PM      Passed - Last BP in normal range    BP Readings from Last 1 Encounters:  07/02/22 124/80         Passed - Valid encounter within last 12 months    Recent Outpatient Visits           6 months ago Paresthesia of right upper and lower extremity   [place] [facility] [place], Salvadore Oxford, NP   7 months ago Muscle cramps   [place] [facility] [place], Salvadore Oxford, NP   9 months ago Encounter for general adult medical examination with abnormal findings   [place] [facility] [place], Salvadore Oxford, NP   1 year ago Anxiety and depression   [place] [facility] [place], Salvadore Oxford, NP   1 year ago Encounter for general adult medical examination with abnormal findings   [place] [facility] [place], Salvadore Oxford, [place]              Passed - Patient is not a smoker

## 2023-01-29 ENCOUNTER — Ambulatory Visit: Payer: Self-pay | Admitting: *Deleted

## 2023-01-29 NOTE — Telephone Encounter (Signed)
Message from [place] T sent at 01/29/2023  1:42 PM EDT  Summary: diarrhea   Patient called stated she has been experiencing cough, scratchy throat, diarrhea-black stool and has been going on for 2 days. Please f/u with patient          Call History  Contact Date/Time Type Contact Phone/Fax User  01/29/2023 01:41 PM EDT Phone (Incoming) Cindy Bonilla, Cindy Bonilla (Self) [phone] Judie Petit) Elon Jester   Reason for Disposition  [1] SEVERE diarrhea (e.g., 7 or more times / day more than normal) AND [2] present > 24 hours (1 day)  Answer Assessment - Initial Assessment Questions 1. DIARRHEA SEVERITY: "How bad is the diarrhea?" "How many more stools have you had in the past 24 hours than normal?"    - NO DIARRHEA (SCALE 0)   - MILD (SCALE 1-3): Few loose or mushy BMs; increase of 1-3 stools over normal daily number of stools; mild increase in ostomy output.   -  MODERATE (SCALE 4-7): Increase of 4-6 stools daily over normal; moderate increase in ostomy output.   -  SEVERE (SCALE 8-10; OR "WORST POSSIBLE"): Increase of 7 or more stools daily over normal; moderate increase in ostomy output; incontinence.     I'm having black diarrhea for 2 days and a cough and scratchy for  days.   I can't keep any food down.   I'm drinking tomato soup and feel a little nauseaed.   I took Tums to see if it would help.   I ate last yesterday.   During the night I vomited all night.    I don't know what is going on.    I passing a lot of gas. 2. ONSET: "When did the diarrhea begin?"      Yesterday  I'm coughing a little mucus.   My nose is running.   I'm having body aches.   3. BM CONSISTENCY: "How loose or watery is the diarrhea?"      It's black because I've been taking Pepto Bismol.   This vomiting is taking everything out of me. 4. VOMITING: "Are you also vomiting?" If Yes, ask: "How many times in the past 24 hours?"      Yes 5. ABDOMEN PAIN: "Are you having any abdomen pain?" If Yes, ask: "What does it feel like?"  (e.g., crampy, dull, intermittent, constant)      Abd cramping.    I have stomach problems so I don't know if it's from that or not.    I have medicine for my stomach.   I take muscle relaxer for my stomach.    I'm hurting everywhere  6. ABDOMEN PAIN SEVERITY: If present, ask: "How bad is the pain?"  (e.g., Scale 1-10; mild, moderate, or severe)   - MILD (1-3): doesn't interfere with normal activities, abdomen soft and not tender to touch    - MODERATE (4-7): interferes with normal activities or awakens from sleep, abdomen tender to touch    - SEVERE (8-10): excruciating pain, doubled over, unable to do any normal activities       Yes abd cramping. 7. ORAL INTAKE: If vomiting, "Have you been able to drink liquids?" "How much liquids have you had in the past 24 hours?"     I can't keep anything down. 8. HYDRATION: "Any signs of dehydration?" (e.g., dry mouth [not just dry lips], too weak to stand, dizziness, new weight loss) "When did you last urinate?"     I'm not keeping anything down. 9. EXPOSURE: "Have  you traveled to a foreign country recently?" "Have you been exposed to anyone with diarrhea?" "Could you have eaten any food that was spoiled?"     No 10. ANTIBIOTIC USE: "Are you taking antibiotics now or have you taken antibiotics in the past 2 months?"       No 11. OTHER SYMPTOMS: "Do you have any other symptoms?" (e.g., fever, blood in stool)       Runny nose, scratchy throat. 12. PREGNANCY: "Is there any chance you are pregnant?" "When was your last menstrual period?"       Not asked  Protocols used: [facility]

## 2023-01-29 NOTE — Telephone Encounter (Signed)
  Chief Complaint: Vomiting and diarrhea for 2 days Symptoms: hurting all over, weak, vomiting and diarrhea Frequency: for the last 2 days.  Abd pain.   She takes a muscle relaxer for her stomach anyway but she is not keeping anything down and having the diarrhea.   Vomited all night last night Pertinent Negatives: Patient denies fever. Disposition: [] ED /[] Urgent Care (no appt availability in office) / [] Appointment(In office/virtual)/ []  [place] [facility]/ [] Home Care/ [] Refused Recommended Disposition /[] [place] [facility]/ []  Follow-up with PCP Additional Notes: Appt made with Nicki Reaper, NP virtually for 01/30/2023 at 8:00.

## 2023-01-30 ENCOUNTER — Telehealth (INDEPENDENT_AMBULATORY_CARE_PROVIDER_SITE_OTHER): Payer: 59 | Admitting: Internal Medicine

## 2023-01-30 ENCOUNTER — Encounter: Payer: Self-pay | Admitting: Internal Medicine

## 2023-01-30 ENCOUNTER — Other Ambulatory Visit: Payer: Self-pay | Admitting: Internal Medicine

## 2023-01-30 DIAGNOSIS — R519 Headache, unspecified: Secondary | ICD-10-CM | POA: Diagnosis not present

## 2023-01-30 DIAGNOSIS — R051 Acute cough: Secondary | ICD-10-CM | POA: Diagnosis not present

## 2023-01-30 DIAGNOSIS — F419 Anxiety disorder, unspecified: Secondary | ICD-10-CM | POA: Diagnosis not present

## 2023-01-30 DIAGNOSIS — J3489 Other specified disorders of nose and nasal sinuses: Secondary | ICD-10-CM

## 2023-01-30 DIAGNOSIS — F5101 Primary insomnia: Secondary | ICD-10-CM

## 2023-01-30 DIAGNOSIS — M5441 Lumbago with sciatica, right side: Secondary | ICD-10-CM | POA: Diagnosis not present

## 2023-01-30 DIAGNOSIS — G8929 Other chronic pain: Secondary | ICD-10-CM | POA: Diagnosis not present

## 2023-01-30 DIAGNOSIS — K580 Irritable bowel syndrome with diarrhea: Secondary | ICD-10-CM | POA: Diagnosis not present

## 2023-01-30 DIAGNOSIS — R252 Cramp and spasm: Secondary | ICD-10-CM

## 2023-01-30 DIAGNOSIS — K219 Gastro-esophageal reflux disease without esophagitis: Secondary | ICD-10-CM

## 2023-01-30 DIAGNOSIS — K589 Irritable bowel syndrome without diarrhea: Secondary | ICD-10-CM | POA: Insufficient documentation

## 2023-01-30 DIAGNOSIS — R0602 Shortness of breath: Secondary | ICD-10-CM | POA: Diagnosis not present

## 2023-01-30 DIAGNOSIS — F32A Depression, unspecified: Secondary | ICD-10-CM

## 2023-01-30 DIAGNOSIS — J029 Acute pharyngitis, unspecified: Secondary | ICD-10-CM | POA: Diagnosis not present

## 2023-01-30 DIAGNOSIS — R197 Diarrhea, unspecified: Secondary | ICD-10-CM

## 2023-01-30 DIAGNOSIS — R112 Nausea with vomiting, unspecified: Secondary | ICD-10-CM | POA: Diagnosis not present

## 2023-01-30 MED ORDER — PROMETHAZINE-DM 6.25-15 MG/5ML PO SYRP: 5.0000 mL | ORAL_SOLUTION | Freq: Four times a day (QID) | ORAL | 0 refills | Status: DC | PRN

## 2023-01-30 MED ORDER — BENZONATATE 100 MG PO CAPS: 100.0000 mg | ORAL_CAPSULE | Freq: Three times a day (TID) | ORAL | 0 refills | Status: DC | PRN

## 2023-01-30 NOTE — Progress Notes (Signed)
Virtual Visit via Video Note  I connected with Cindy Bonilla on 01/30/23 at  8:00 AM EDT by a video enabled telemedicine application and verified that I am speaking with the correct person using two identifiers.  Location: Patient: Home Provider: Home  Persons participating in this video call: Ovidio Hanger, NP and Cindy Bonilla   I discussed the limitations of evaluation and management by telemedicine and the availability of in person appointments. The patient expressed understanding and agreed to proceed.  History of Present Illness:  Patient due for follow-up of chronic conditions.  GERD: Triggered by greasy foods.  She takes omeprazole as needed.  Upper GI from 10/2019 reviewed.  IBS: She reports mainly diarrhea. She takes dicyclomine as needed.  Colonoscopy from 10/2019 reviewed.  Sciatica: She reports chronic low back pain that radiates into her right leg.  She takes tizanidine as needed with some relief of symptoms.  MRI lumbar spine from 07/2020 reviewed.  She does not want to do back injections. She follows with orthopedics and chiropractor.  Insomnia: She has difficulty.  She takes an OTC sleep aid as needed with some relief of symptoms.  There is no sleep study on file.  Anxiety and depression: Chronic, managed on buspirone.  She is not currently seeing a therapist.  She denies SI/HI.   She also reports headache, runny nose, nasal congestion, ear fullness, sore throat, cough, shortness of breath, nausea, vomiting and diarrhea.  This started 2 days ago.  She is blowing some mucus out of her nose.  She is having difficulty swallowing.  The cough is productive of mucus as well.  She denies fever but has had chills and body aches. She has tried bentyl, antacids, naproxen, and tylenol with minimal relief of symptoms. She has had sick contacts diagnosed with strep, viral infections. She does work at a school.   Past Medical History:  Diagnosis Date   Anxiety    Arthritis     Depression    Urinary urgency     Current Outpatient Medications  Medication Sig Dispense Refill   benzonatate (TESSALON) 100 MG capsule Take 1 capsule (100 mg total) by mouth 3 (three) times daily as needed for cough. 20 capsule 0   busPIRone (BUSPAR) 10 MG tablet TAKE 2 TABLETS BY MOUTH 2 TIMES DAILY. 360 tablet 0   dicyclomine (BENTYL) 20 MG tablet TAKE 1 TABLET BY MOUTH THREE TIMES DAILY AS NEEDED FOR SPASMS. 90 tablet 1   norgestrel-ethinyl estradiol (LOW-OGESTREL) 0.3-30 MG-MCG tablet TAKE 1 TABLET BY MOUTH EVERY DAY 84 tablet 0   omeprazole (PRILOSEC) 20 MG capsule TAKE 1 CAPSULE (20 MG TOTAL) BY MOUTH 2 (TWO) TIMES DAILY BEFORE A MEAL. 180 capsule 0   tiZANidine (ZANAFLEX) 4 MG tablet TAKE 1 CAPSULE (4 MG TOTAL) BY MOUTH DAILY AS NEEDED FOR MUSCLE SPASMS. 30 tablet 0   No current facility-administered medications for this visit.    Allergies  Allergen Reactions   Latex Rash and Hives   Amoxicillin    Motrin [Ibuprofen] Other (See Comments)    Rectal bleeding.    Family History  Problem Relation Age of Onset   Diabetes Mother    Depression Mother    Aneurysm Brother    Other Father        "some rare disease"   Bladder Cancer Neg Hx    Prostate cancer Neg Hx    Kidney cancer Neg Hx     Social History   Socioeconomic History   Marital status: Significant  Other    Spouse name: Not on file   Number of children: Not on file   Years of education: Not on file   Highest education level: Not on file  Occupational History   Not on file  Tobacco Use   Smoking status: Former    Current packs/day: 0.00    Types: Cigarettes    Quit date: 09/15/2004    Years since quitting: 18.3   Smokeless tobacco: Never  Vaping Use   Vaping status: Former   Substances: Flavoring  Substance and Sexual Activity   Alcohol use: Yes    Comment: occasional   Drug use: No   Sexual activity: Not on file  Other Topics Concern   Not on file  Social History Narrative   Not on file    Social Determinants of Health   Financial Resource Strain: Not on file  Food Insecurity: Not on file  Transportation Needs: Not on file  Physical Activity: Not on file  Stress: Not on file  Social Connections: Not on file  Intimate Partner Violence: Not on file     Constitutional: Patient reports malaise, fatigue, headache.  Denies fever or abrupt weight changes.  HEENT: Patient reports runny nose, nasal congestion, ear fullness and sore throat.  Denies eye pain, eye redness, ear pain, ringing in the ears, wax buildup, bloody nose. Respiratory: Patient reports cough and shortness of breath.  Denies difficulty breathing.   Cardiovascular: Denies chest pain, chest tightness, palpitations or swelling in the hands or feet.  Gastrointestinal: Patient reports nausea, vomiting and diarrhea.  Denies abdominal pain, bloating, constipation, or blood in the stool.  GU: Denies urgency, frequency, pain with urination, burning sensation, blood in urine, odor or discharge. Musculoskeletal: Patient reports chronic low back pain.  Denies decrease in range of motion, difficulty with gait, or joint swelling.  Skin: Denies redness, rashes, lesions or ulcercations.  Neurological: Patient reports insomnia.  Denies dizziness, difficulty with memory, difficulty with speech or problems with balance and coordination.  Psych: Patient has a history of anxiety and depression.  Denies SI/HI.  No other specific complaints in a complete review of systems (except as listed in HPI above).  Observations/Objective:   Wt Readings from Last 3 Encounters:  07/02/22 131 lb (59.4 kg)  05/22/22 137 lb (62.1 kg)  03/27/22 134 lb (60.8 kg)    General: Appears her stated age, appears unwell but in NAD. HEENT: Nose: congestion noted; Throat: hoarseness noted Pulmonary/Chest: Normal effort. No respiratory distress.   Neurological: Alert and oriented. Coordination normal.  Psychiatric: Mood and affect normal. Behavior is  normal. Judgment and thought content normal.     BMET    Component Value Date/Time   NA 136 05/22/2022 1601   NA 142 10/18/2015 1047   K 4.3 05/22/2022 1601   CL 103 05/22/2022 1601   CO2 24 05/22/2022 1601   GLUCOSE 163 (H) 05/22/2022 1601   BUN 11 05/22/2022 1601   BUN 6 10/18/2015 1047   CREATININE 1.00 (H) 05/22/2022 1601   CALCIUM 9.1 05/22/2022 1601   GFRNONAA 70 12/05/2020 0955   GFRAA 81 12/05/2020 0955    Lipid Panel     Component Value Date/Time   CHOL 161 05/22/2022 1601   CHOL 156 10/18/2015 1047   TRIG 90 05/22/2022 1601   HDL 67 05/22/2022 1601   HDL 48 10/18/2015 1047   CHOLHDL 2.4 05/22/2022 1601   LDLCALC 77 05/22/2022 1601    CBC    Component Value Date/Time  WBC 6.1 05/22/2022 1601   RBC 4.21 05/22/2022 1601   HGB 13.4 05/22/2022 1601   HCT 38.9 05/22/2022 1601   PLT 237 05/22/2022 1601   MCV 92.4 05/22/2022 1601   MCH 31.8 05/22/2022 1601   MCHC 34.4 05/22/2022 1601   RDW 11.8 05/22/2022 1601   LYMPHSABS 2,760 09/29/2019 0850   EOSABS 218 09/29/2019 0850   BASOSABS 30 09/29/2019 0850    Hgb A1C Lab Results  Component Value Date   HGBA1C 5.4 04/11/2021       Assessment and Plan:  Acute headache, runny nose, nasal congestion, ear fullness, sore throat, cough, shortness of breath, nausea, vomiting and diarrhea:  Rapid strep: negative Rapid COVID: negative Encourage rest and fluids Consume a bland/BRAT diet Rx for Tessalon 100 mg 3 times daily as needed Rx for Promethazine DM cough syrup to help aid with cough and nausea- sedation caution given Work note provided  RTC in 3 months for annual exam  Follow Up Instructions:    I discussed the assessment and treatment plan with the patient. The patient was provided an opportunity to ask questions and all were answered. The patient agreed with the plan and demonstrated an understanding of the instructions.   The patient was advised to call back or seek an in-person evaluation if  the symptoms worsen or if the condition fails to improve as anticipated.    Nicki Reaper, NP

## 2023-01-30 NOTE — Assessment & Plan Note (Signed)
Stable on buspirone Support offered

## 2023-01-30 NOTE — Assessment & Plan Note (Signed)
Avoid foods that trigger reflux Continue omeprazole as needed

## 2023-01-30 NOTE — Assessment & Plan Note (Signed)
Encourage low FODMAP diet Continue dicyclomine as needed

## 2023-01-30 NOTE — Assessment & Plan Note (Signed)
Encouraged regular stretching and core strengthening Continue tizanidine as needed She will continue to follow with orthopedics and chiropractor

## 2023-01-30 NOTE — Assessment & Plan Note (Signed)
Okay to continue OTC sleep aid as needed

## 2023-01-31 NOTE — Telephone Encounter (Signed)
Requested medication (s) are due for refill today: Yes  Requested medication (s) are on the active medication list: Yes  Last refill:  09/13/22 #30 0RF  Future visit scheduled: No  Notes to clinic:  Unable to refill per protocol, cannot delegate.      Requested Prescriptions  Pending Prescriptions Disp Refills   tiZANidine (ZANAFLEX) 4 MG tablet [Pharmacy Med Name: TIZANIDINE HCL 4 MG TABLET] 30 tablet 0    Sig: TAKE 1 CAPSULE (4 MG TOTAL) BY MOUTH DAILY AS NEEDED FOR MUSCLE SPASMS.     Not Delegated - Cardiovascular:  Alpha-2 Agonists - tizanidine Failed - 01/30/2023  9:35 AM      Failed - This refill cannot be delegated      Passed - Valid encounter within last 6 months    Recent Outpatient Visits           Yesterday Gastroesophageal reflux disease without esophagitis   [place] [facility] [place], Salvadore Oxford, NP   7 months ago Paresthesia of right upper and lower extremity   [place] [facility] [place], Salvadore Oxford, NP   8 months ago Muscle cramps   [place] [facility] [place], Salvadore Oxford, NP   10 months ago Encounter for general adult medical examination with abnormal findings   [place] [place], Salvadore Oxford, NP   1 year ago Anxiety and depression   [place] [facility] [place], Salvadore Oxford, NP              Signed Prescriptions Disp Refills   busPIRone (BUSPAR) 10 MG tablet 360 tablet 1    Sig: TAKE 2 TABLETS BY MOUTH TWICE A DAY     Psychiatry: Anxiolytics/Hypnotics - Non-controlled Passed - 01/30/2023  9:35 AM      Passed - Valid encounter within last 12 months    Recent Outpatient Visits           Yesterday Gastroesophageal reflux disease without esophagitis   [place] [facility] [place], Salvadore Oxford, NP   7 months ago Paresthesia of right upper and lower extremity   [place] [facility] [place], Salvadore Oxford, NP   8 months ago Muscle cramps    [place] [facility] [place], Salvadore Oxford, NP   10 months ago Encounter for general adult medical examination with abnormal findings   [place] [facility] [place], Salvadore Oxford, NP   1 year ago Anxiety and depression   [place] [facility] [place], [place], NP               norgestrel-ethinyl estradiol (LOW-OGESTREL) 0.3-30 MG-MCG tablet 84 tablet 2    Sig: TAKE 1 TABLET BY MOUTH EVERY DAY     OB/GYN:  Contraceptives Passed - 01/30/2023  9:35 AM      Passed - Last BP in normal range    BP Readings from Last 1 Encounters:  07/02/22 124/80         Passed - Valid encounter within last 12 months    Recent Outpatient Visits           Yesterday Gastroesophageal reflux disease without esophagitis   [place] [facility] [place], Salvadore Oxford, NP   7 months ago Paresthesia of right upper and lower extremity   [place] [facility] [place], Salvadore Oxford, NP   8 months ago Muscle  cramps   [place] [facility], Salvadore Oxford, NP   10 months ago Encounter for general adult medical examination with abnormal findings   [place] [facility], Salvadore Oxford, NP   1 year ago Anxiety and depression    [facility] [place], Salvadore Oxford, [place]              Passed - Patient is not a smoker

## 2023-01-31 NOTE — Telephone Encounter (Signed)
Requested Prescriptions  Pending Prescriptions Disp Refills   busPIRone (BUSPAR) 10 MG tablet [Pharmacy Med Name: BUSPIRONE HCL 10 MG TABLET] 120 tablet 2    Sig: TAKE 2 TABLETS BY MOUTH TWICE A DAY     Psychiatry: Anxiolytics/Hypnotics - Non-controlled Passed - 01/30/2023  9:35 AM      Passed - Valid encounter within last 12 months    Recent Outpatient Visits           Yesterday Gastroesophageal reflux disease without esophagitis   [place] [facility] [place], Salvadore Oxford, NP   7 months ago Paresthesia of right upper and lower extremity   [place] [facility] [place], Salvadore Oxford, NP   8 months ago Muscle cramps   [place] [facility] [place], Salvadore Oxford, NP   10 months ago Encounter for general adult medical examination with abnormal findings   [place] [facility] [place], Salvadore Oxford, NP   1 year ago Anxiety and depression   [place] [facility] [place], Salvadore Oxford, NP               LOW-OGESTREL 0.3-30 MG-MCG tablet [Pharmacy Med Name: LOW-OGESTREL-28 TABLET] 28 tablet 2    Sig: TAKE 1 TABLET BY MOUTH EVERY DAY     OB/GYN:  Contraceptives Passed - 01/30/2023  9:35 AM      Passed - Last BP in normal range    BP Readings from Last 1 Encounters:  07/02/22 124/80         Passed - Valid encounter within last 12 months    Recent Outpatient Visits           Yesterday Gastroesophageal reflux disease without esophagitis   [place] [facility] [place], Salvadore Oxford, NP   7 months ago Paresthesia of right upper and lower extremity   [place] [facility] [place], Salvadore Oxford, NP   8 months ago Muscle cramps   [place] [facility] [place], Salvadore Oxford, NP   10 months ago Encounter for general adult medical examination with abnormal findings   [place] [facility] [place], Salvadore Oxford, NP   1 year ago Anxiety and depression   [place] [facility] [place], Salvadore [place], [place]              Passed - Patient is not a smoker       tiZANidine (ZANAFLEX) 4 MG tablet [Pharmacy Med Name: TIZANIDINE HCL 4 MG TABLET] 30 tablet 0    Sig: TAKE 1 CAPSULE (4 MG TOTAL) BY MOUTH DAILY AS NEEDED FOR MUSCLE SPASMS.     Not Delegated - Cardiovascular:  Alpha-2 Agonists - tizanidine Failed - 01/30/2023  9:35 AM      Failed - This refill cannot be delegated      Passed - Valid encounter within last 6 months    Recent Outpatient Visits           Yesterday Gastroesophageal reflux disease without esophagitis   [place] [facility] [place], Salvadore Oxford, NP   7 months ago Paresthesia of right upper and lower extremity   [place] [facility] [place], Salvadore Oxford, NP   8 months ago Muscle cramps   [place] [facility] [place], Salvadore Oxford, NP   10 months ago Encounter for general adult medical examination with abnormal findings     [facility] [place], Salvadore Oxford, NP   1 year ago Anxiety and depression   [place] [facility] [place], Salvadore Oxford, [place]

## 2023-02-06 ENCOUNTER — Emergency Department: Payer: 59

## 2023-02-06 ENCOUNTER — Encounter: Payer: Self-pay | Admitting: Emergency Medicine

## 2023-02-06 ENCOUNTER — Emergency Department
Admission: EM | Admit: 2023-02-06 | Discharge: 2023-02-06 | Disposition: A | Discharge status: Home / Self Care | Payer: 59 | Attending: Emergency Medicine | Admitting: Emergency Medicine

## 2023-02-06 ENCOUNTER — Other Ambulatory Visit: Payer: Self-pay

## 2023-02-06 ENCOUNTER — Ambulatory Visit: Payer: Self-pay | Admitting: *Deleted

## 2023-02-06 DIAGNOSIS — R1084 Generalized abdominal pain: Secondary | ICD-10-CM

## 2023-02-06 DIAGNOSIS — R112 Nausea with vomiting, unspecified: Secondary | ICD-10-CM | POA: Diagnosis not present

## 2023-02-06 DIAGNOSIS — K529 Noninfective gastroenteritis and colitis, unspecified: Secondary | ICD-10-CM | POA: Diagnosis not present

## 2023-02-06 DIAGNOSIS — R109 Unspecified abdominal pain: Secondary | ICD-10-CM | POA: Diagnosis not present

## 2023-02-06 DIAGNOSIS — R079 Chest pain, unspecified: Secondary | ICD-10-CM | POA: Diagnosis not present

## 2023-02-06 DIAGNOSIS — Z1152 Encounter for screening for COVID-19: Secondary | ICD-10-CM | POA: Insufficient documentation

## 2023-02-06 DIAGNOSIS — R059 Cough, unspecified: Secondary | ICD-10-CM | POA: Diagnosis not present

## 2023-02-06 LAB — CBC WITH DIFFERENTIAL/PLATELET
Abs Immature Granulocytes: 0.01 10*3/uL (ref 0.00–0.07)
Basophils Absolute: 0 10*3/uL (ref 0.0–0.1)
Basophils Relative: 1 %
Eosinophils Absolute: 0.1 10*3/uL (ref 0.0–0.5)
Eosinophils Relative: 2 %
HCT: 40.4 % (ref 36.0–46.0)
Hemoglobin: 14.2 g/dL (ref 12.0–15.0)
Immature Granulocytes: 0 %
Lymphocytes Relative: 35 %
Lymphs Abs: 2.2 10*3/uL (ref 0.7–4.0)
MCH: 31.3 pg (ref 26.0–34.0)
MCHC: 35.1 g/dL (ref 30.0–36.0)
MCV: 89 fL (ref 80.0–100.0)
Monocytes Absolute: 0.4 10*3/uL (ref 0.1–1.0)
Monocytes Relative: 6 %
Neutro Abs: 3.7 10*3/uL (ref 1.7–7.7)
Neutrophils Relative %: 56 %
Platelets: 294 10*3/uL (ref 150–400)
RBC: 4.54 MIL/uL (ref 3.87–5.11)
RDW: 11.9 % (ref 11.5–15.5)
WBC: 6.4 10*3/uL (ref 4.0–10.5)
nRBC: 0 % (ref 0.0–0.2)

## 2023-02-06 LAB — COMPREHENSIVE METABOLIC PANEL
ALT: 21 U/L (ref 0–44)
AST: 26 U/L (ref 15–41)
Albumin: 4.5 g/dL (ref 3.5–5.0)
Alkaline Phosphatase: 38 U/L (ref 38–126)
Anion gap: 9 (ref 5–15)
BUN: 10 mg/dL (ref 6–20)
CO2: 25 mmol/L (ref 22–32)
Calcium: 9.1 mg/dL (ref 8.9–10.3)
Chloride: 103 mmol/L (ref 98–111)
Creatinine, Ser: 0.83 mg/dL (ref 0.44–1.00)
GFR, Estimated: >60 mL/min (ref >60)
Glucose, Bld: 133 mg/dL — ABNORMAL HIGH (ref 70–99)
Potassium: 3.8 mmol/L (ref 3.5–5.1)
Sodium: 137 mmol/L (ref 135–145)
Total Bilirubin: 0.9 mg/dL (ref 0.3–1.2)
Total Protein: 7.6 g/dL (ref 6.5–8.1)

## 2023-02-06 LAB — RESP PANEL BY RT-PCR (RSV, FLU A&B, COVID)  RVPGX2
Influenza A by PCR: NEGATIVE
Influenza B by PCR: NEGATIVE
Resp Syncytial Virus by PCR: NEGATIVE
SARS Coronavirus 2 by RT PCR: NEGATIVE

## 2023-02-06 LAB — URINALYSIS, ROUTINE W REFLEX MICROSCOPIC
Bilirubin Urine: NEGATIVE
Glucose, UA: NEGATIVE mg/dL
Hgb urine dipstick: NEGATIVE
Ketones, ur: NEGATIVE mg/dL
Leukocytes,Ua: NEGATIVE
Nitrite: NEGATIVE
Protein, ur: NEGATIVE mg/dL
Specific Gravity, Urine: 1.003 — ABNORMAL LOW (ref 1.005–1.030)
pH: 7 (ref 5.0–8.0)

## 2023-02-06 LAB — POC URINE PREG, ED: Preg Test, Ur: NEGATIVE

## 2023-02-06 LAB — LIPASE, BLOOD: Lipase: 36 U/L (ref 11–51)

## 2023-02-06 MED ORDER — SODIUM CHLORIDE 0.9 % IV BOLUS
1000.0000 mL | Freq: Once | INTRAVENOUS | Status: AC
  Administered 2023-02-06: 1000 mL via INTRAVENOUS

## 2023-02-06 MED ORDER — ONDANSETRON HCL 4 MG/2ML IJ SOLN: 4.0000 mg | Freq: Once | INTRAMUSCULAR | Status: DC

## 2023-02-06 MED ORDER — METOCLOPRAMIDE HCL 10 MG PO TABS
10.0000 mg | ORAL_TABLET | Freq: Three times a day (TID) | ORAL | 0 refills | Status: DC
Start: 2023-02-06 — End: 2024-02-27

## 2023-02-06 MED ORDER — IOHEXOL 300 MG/ML  SOLN
100.0000 mL | Freq: Once | INTRAMUSCULAR | Status: AC | PRN
  Administered 2023-02-06: 100 mL via INTRAVENOUS

## 2023-02-06 NOTE — ED Triage Notes (Signed)
Pt via POV from home. Pt c/o productive cough, generalized body aches, chest pain when she coughs. States symptoms have been going on for the past 2 weeks. Pt is A&Ox4 and NAD

## 2023-02-06 NOTE — Telephone Encounter (Signed)
Will review urgent care/ED note once available

## 2023-02-06 NOTE — ED Provider Notes (Signed)
----------------------------------------- 3:41 PM on 02/06/2023 -----------------------------------------  Blood pressure (!) 146/100, pulse (!) 106, temperature 98.1 F (36.7 C), temperature source Oral, resp. rate 16, height 5\' 2"  (1.575 m), weight 63.5 kg, SpO2 100%.  Assuming care from Dr. Greig Right, PA-C/NP-C.  In short, Cindy Bonilla is a 42 y.o. female with a chief complaint of Cough .  Refer to the original H&P for additional details.  The current plan of care is to await pending CT results and disposition the patient accordingly. ____________________________________________    ED Results / Procedures / Treatments   Labs (all labs ordered are listed, but only abnormal results are displayed) Labs Reviewed  COMPREHENSIVE METABOLIC PANEL - Abnormal; Notable for the following components:      Result Value   Glucose, Bld 133 (*)    All other components within normal limits  URINALYSIS, ROUTINE W REFLEX MICROSCOPIC - Abnormal; Notable for the following components:   Color, Urine STRAW (*)    APPearance CLEAR (*)    Specific Gravity, Urine 1.003 (*)    All other components within normal limits  RESP PANEL BY RT-PCR (RSV, FLU A&B, COVID)  RVPGX2  CBC WITH DIFFERENTIAL/PLATELET  LIPASE, BLOOD  POC URINE PREG, ED     EKG   RADIOLOGY  I personally viewed and evaluated these images as part of my medical decision making, as well as reviewing the written report by the radiologist.  ED Provider Interpretation: Mild colitis but no other acute findings.  CT ABDOMEN PELVIS W CONTRAST  Result Date: 02/06/2023 CLINICAL DATA:  Abdominal pain.  Nausea vomiting and diarrhea. EXAM: CT ABDOMEN AND PELVIS WITH CONTRAST TECHNIQUE: Multidetector CT imaging of the abdomen and pelvis was performed using the standard protocol following bolus administration of intravenous contrast. RADIATION DOSE REDUCTION: This exam was performed according to the departmental dose-optimization program  which includes automated exposure control, adjustment of the mA and/or kV according to patient size and/or use of iterative reconstruction technique. CONTRAST:  OMNIPAQUE IOHEXOL 300 MG/ML  SOLN COMPARISON:  CT abdomen pelvis dated 11/19/2019. FINDINGS: Lower chest: The visualized lung bases are clear. No intra-abdominal free air or free fluid. Hepatobiliary: The liver is unremarkable. No biliary dilatation. The gallbladder is unremarkable. Pancreas: Unremarkable. No pancreatic ductal dilatation or surrounding inflammatory changes. Spleen: Normal in size without focal abnormality. Adrenals/Urinary Tract: The adrenal glands are unremarkable. The kidneys, visualized ureters, and urinary bladder appear unremarkable. Stomach/Bowel: Mild thickened appearance of the colon, likely related to underdistention. Mild colitis is less likely. Clinical correlation is recommended. There is no bowel obstruction. The appendix is normal. Vascular/Lymphatic: The abdominal aorta and IVC are unremarkable. No portal venous gas. There is no adenopathy. Reproductive: The uterus is anteverted and grossly unremarkable. No adnexal masses. Other: None Musculoskeletal: No acute or significant osseous findings. IMPRESSION: Underdistention of the colon versus less likely mild colitis. No bowel obstruction. Normal appendix. Electronically Signed   By: Elgie Collard M.D.   On: 02/06/2023 16:36   DG Chest 2 View  Result Date: 02/06/2023 CLINICAL DATA:  Provided history: Cough for two weeks. Additional history provided: Chest pain with coughing. Body aches. EXAM: CHEST - 2 VIEW COMPARISON:  None. FINDINGS: Heart size within normal limits. No appreciable airspace consolidation. No evidence of pleural effusion or pneumothorax. No acute osseous abnormality identified. Degenerative changes of the spine. IMPRESSION: No evidence of active cardiopulmonary disease. Electronically Signed   By: Jackey Loge D.O.   On: 02/06/2023 14:28      PROCEDURES:  Critical  Care performed: No  Procedures   MEDICATIONS ORDERED IN ED: Medications  ondansetron (ZOFRAN) injection 4 mg (4 mg Intravenous Patient Refused/Not Given 02/06/23 1428)  sodium chloride 0.9 % bolus 1,000 mL (0 mLs Intravenous Stopped 02/06/23 1636)  iohexol (OMNIPAQUE) 300 MG/ML solution 100 mL (100 mLs Intravenous Contrast Given 02/06/23 1545)     IMPRESSION / MDM / ASSESSMENT AND PLAN / ED COURSE  I reviewed the triage vital signs and the nursing notes.                              Differential diagnosis includes, but is not limited to, ovarian cyst, ovarian torsion, acute appendicitis, diverticulitis, urinary tract infection/pyelonephritis, endometriosis, bowel obstruction, colitis, renal colic, gastroenteritis, hernia, fibroids, endometriosis, pregnancy related pain including ectopic pregnancy, etc.   Patient's presentation is most consistent with acute complicated illness / injury requiring diagnostic workup.  Patient's diagnosis is consistent with mild colitis on CT, likely viral in etiology.  With reassuring exam and workup overall.  No signs of any acute leukocytosis or critical anemia.  Patient is afebrile and nontoxic-appearing on presentation.  Patient will be discharged home with prescriptions for Reglan. Patient is to follow up with her primary provider or GI medicine as discussed as needed or otherwise directed. Patient is given ED precautions to return to the ED for any worsening or new symptoms.   FINAL CLINICAL IMPRESSION(S) / ED DIAGNOSES   Final diagnoses:  Generalized abdominal pain  Colitis     Rx / DC Orders   ED Discharge Orders          Ordered    metoCLOPramide (REGLAN) 10 MG tablet  3 times daily with meals        02/06/23 1654             Note:  This document was prepared using Dragon voice recognition software and may include unintentional dictation errors.    Lissa Hoard, PA-C 02/06/23 1710     Janith Lima, MD 02/06/23 [phone]

## 2023-02-06 NOTE — Telephone Encounter (Signed)
Pt stated "something is not right" Pt nauseated and no appetite. Has URI sx and diarrhea. No SOB but with the c/o chest pressure that radiates to the back advised to go to ED. Pt tearful and reassurance given and pt calmer. Pt stated she willl go.

## 2023-02-06 NOTE — ED Provider Notes (Signed)
[facility] Provider Note    Event Date/Time   First MD Initiated Contact with Patient 02/06/23 1333     (approximate)   History   Cough   HPI  Cindy Bonilla is a 42 y.o. female with history of arthritis, GERD, anxiety presents emergency department with cough, congestion, chest pain, nausea, vomiting, diarrhea and abdominal pain along with generalized bodyaches.  Symptoms started 2 weeks ago with the cough and congestion.  Was seen at her primary care doctor and was tested for strep and COVID which was negative.  Patient states got worse yesterday.  He has been unable to eat due to the nausea.  States she almost passed out on the way home from work yesterday      Physical Exam   Triage Vital Signs: ED Triage Vitals  Encounter Vitals Group     BP 02/06/23 1242 (!) 146/100     Systolic BP Percentile --      Diastolic BP Percentile --      Pulse Rate 02/06/23 1242 (!) 106     Resp 02/06/23 1242 16     Temp 02/06/23 1242 98.1 F (36.7 C)     Temp Source 02/06/23 1242 Oral     SpO2 02/06/23 1242 100 %     Weight 02/06/23 1239 140 lb (63.5 kg)     Height 02/06/23 1239 5\' 2"  (1.575 m)     Head Circumference --      Peak Flow --      Pain Score 02/06/23 1249 10     Pain Loc --      Pain Education --      Exclude from Growth Chart --     Most recent vital signs: Vitals:   02/06/23 1242  BP: (!) 146/100  Pulse: (!) 106  Resp: 16  Temp: 98.1 F (36.7 C)  SpO2: 100%     General: Awake, no distress.   CV:  Good peripheral perfusion.  Tachycardic  resp:  Normal effort. Lungs CTA Abd:  No distention.  Tender in the epigastric, right upper quadrant and right lower quadrant Other:      ED Results / Procedures / Treatments   Labs (all labs ordered are listed, but only abnormal results are displayed) Labs Reviewed  COMPREHENSIVE METABOLIC PANEL - Abnormal; Notable for the following components:      Result Value   Glucose, Bld 133 (*)     All other components within normal limits  RESP PANEL BY RT-PCR (RSV, FLU A&B, COVID)  RVPGX2  CBC WITH DIFFERENTIAL/PLATELET  LIPASE, BLOOD  URINALYSIS, ROUTINE W REFLEX MICROSCOPIC  POC URINE PREG, ED     EKG  EKG   RADIOLOGY Chest x-ray, CT abdomen pelvis IV contrast    PROCEDURES:   Procedures   MEDICATIONS ORDERED IN ED: Medications  ondansetron (ZOFRAN) injection 4 mg (4 mg Intravenous Patient Refused/Not Given 02/06/23 1428)  sodium chloride 0.9 % bolus 1,000 mL (1,000 mLs Intravenous Bolus from Bag 02/06/23 1424)     IMPRESSION / MDM / ASSESSMENT AND PLAN / ED COURSE  I reviewed the triage vital signs and the nursing notes.                              Differential diagnosis includes, but is not limited to, COVID, viral URI, gastroenteritis, acute cholecystitis, pancreatitis, acute appendicitis, pyelonephritis, UTI  Patient's presentation is most consistent with acute presentation with  potential threat to life or bodily function.   Respiratory panel is negative for COVID, influenza and RSV.  Examination the patient shows abdominal tenderness, she is actively nauseated, sweaty.  Will go ahead with lab work and CT abdomen pelvis.  Chest x-ray pending   Patient was given normal saline 1 L IV, Zofran 4 mg IV for nausea and vomiting.  Care transferred to [facility]  FINAL CLINICAL IMPRESSION(S) / ED DIAGNOSES   Final diagnoses:  Generalized abdominal pain     Rx / DC Orders   ED Discharge Orders     None        Note:  This document was prepared using Dragon voice recognition software and may include unintentional dictation errors.    Faythe Ghee, PA-C 02/06/23 1519    Janith Lima, MD 02/06/23 [phone]

## 2023-02-06 NOTE — Discharge Instructions (Signed)
Your exam, lab, and CT overall reassuring.  Your CT scan reveals what may be a mild viral colitis.  This viral infection is usually self-limited.  Take the prescription nausea medicine as prescribed.  Continue to hydrate to prevent dehydration.  Follow-up with your primary provider or GI medicine for ongoing concerns.

## 2023-02-06 NOTE — Telephone Encounter (Signed)
  Chief Complaint: Chest pain/cough Symptoms: Multiple issues; Chest pain at ribs, radiates to back, worse with deep breaths. Sinus pain, muscle aches,cough,neck lymph nodes swollen, sore throat, constipation. Frequency: Chest pain, yesterday Pertinent Negatives: Patient denies  Disposition: [] ED /[x] Urgent Care (no appt availability in office) / [] Appointment(In office/virtual)/ []  [facility]/ [] Home Care/ [] Refused Recommended Disposition /[] [facility]/ []  Follow-up with PCP Additional Notes: Pt reluctant to triage, "Don't feel like talking, feel so bad."  Neg covid and strep 01/30/23. States is following all recommendations, orders from [facility]. No availability at practice or with [facility]. Advised UC, states will follow disposition.  CAlled prior to practice opening. Reason for Disposition  Taking a deep breath makes pain worse  Answer Assessment - Initial Assessment Questions 1. LOCATION: "Where does it hurt?"       "Under ribs" 2. RADIATION: "Does the pain go anywhere else?" (e.g., into neck, jaw, arms, back)     To back 3. ONSET: "When did the chest pain begin?" (Minutes, hours or days)      Yesterday 4. PATTERN: "Does the pain come and go, or has it been constant since it started?"  "Does it get worse with exertion?"      Worse with deep breaths 5. DURATION: "How long does it last" (e.g., seconds, minutes, hours)      6. SEVERITY: "How bad is the pain?"  (e.g., Scale 1-10; mild, moderate, or severe)    - MILD (1-3): doesn't interfere with normal activities     - MODERATE (4-7): interferes with normal activities or awakens from sleep    - SEVERE (8-10): excruciating pain, unable to do any normal activities        7. CARDIAC RISK FACTORS: "Do you have any history of heart problems or risk factors for heart disease?" (e.g., angina, prior heart attack; diabetes, high blood pressure, high cholesterol, smoker, or strong family history of heart disease)      8.  PULMONARY RISK FACTORS: "Do you have any history of lung disease?"  (e.g., blood clots in lung, asthma, emphysema, birth control pills)      9. CAUSE: "What do you think is causing the chest pain?"     Cough maybe 10. OTHER SYMPTOMS: "Do you have any other symptoms?" (e.g., dizziness, nausea, vomiting, sweating, fever, difficulty breathing, cough)       Cough, neck lymph nodes swollen,sore throat > right side, sinus pain, constipation, muscle aches  Protocols used: Chest Pain-A-AH

## 2023-02-08 ENCOUNTER — Ambulatory Visit (INDEPENDENT_AMBULATORY_CARE_PROVIDER_SITE_OTHER): Payer: 59 | Admitting: Family Medicine

## 2023-02-08 ENCOUNTER — Encounter: Payer: Self-pay | Admitting: Family Medicine

## 2023-02-08 VITALS — BP 110/76 | HR 71 | Temp 96.9°F | Ht 62.0 in | Wt 136.4 lb

## 2023-02-08 DIAGNOSIS — J3489 Other specified disorders of nose and nasal sinuses: Secondary | ICD-10-CM | POA: Diagnosis not present

## 2023-02-08 DIAGNOSIS — J0110 Acute frontal sinusitis, unspecified: Secondary | ICD-10-CM | POA: Diagnosis not present

## 2023-02-08 DIAGNOSIS — J9801 Acute bronchospasm: Secondary | ICD-10-CM

## 2023-02-08 MED ORDER — PREDNISONE 10 MG PO TABS
ORAL_TABLET | ORAL | 0 refills | Status: DC
Start: 2023-02-08 — End: 2023-03-15

## 2023-02-08 MED ORDER — ALBUTEROL SULFATE HFA 108 (90 BASE) MCG/ACT IN AERS: 2.0000 | INHALATION_SPRAY | RESPIRATORY_TRACT | 0 refills | Status: DC | PRN

## 2023-02-08 MED ORDER — AZITHROMYCIN 250 MG PO TABS: ORAL_TABLET | ORAL | 0 refills | Status: DC

## 2023-02-08 MED ORDER — BENZONATATE 100 MG PO CAPS: 100.0000 mg | ORAL_CAPSULE | Freq: Every evening | ORAL | 0 refills | Status: DC | PRN

## 2023-02-08 NOTE — Progress Notes (Unsigned)
Subjective:    Patient ID: Cindy Bonilla, female    DOB: 17-Aug-1980, 42 y.o.   MRN: [id number]  Cindy Bonilla is a 42 y.o. female presenting on 02/08/2023 for Cough (And chest congestion onset for over a week) and Advice Only (Recent visit at ER, would like to discuss further with dx of Colitis)   HPI  ED FOLLOW-UP VISIT  Hospital/Location: [facility] Date of ED Visit: 02/06/23  Reason for Presenting to ED: Abdominal Pain Colitis  FOLLOW-UP  - ED provider note and record have been reviewed - Patient presents today about 2 days after recent ED visit. Brief summary of recent course, patient had symptoms of abdominal pain nausea vomiting diarrhea colitis ***  She is having to remain in bathroom longer than expected, and keep food down. She is improving her intake.  - Today reports overall has ***done well after discharge from ED. Symptoms of *** ***have resolved ***are much improved ***seem to be worsening again. - New medications on discharge: *** - Changes to current meds on discharge: ***   GERD: Triggered by greasy foods.  She takes omeprazole as needed.  Upper GI from 10/2019 reviewed.   IBS: She reports mainly diarrhea. She takes dicyclomine as needed.  Colonoscopy from 10/2019 reviewed.   Virtual Visit with PCP 01/30/23  She needs coughing medicine for daytime, she works with children. She prefers something that does not make her drowsy.   ***still coughing Worse at night, if laying on L side   FOR A&P I have reviewed the discharge medication list, and have reconciled the current and discharge medications today.     Health Maintenance: ***     02/08/2023    1:31 PM 05/23/2022    3:34 PM 03/27/2022    2:52 PM  Depression screen PHQ 2/9  Decreased Interest 0 1 1  Down, Depressed, Hopeless 0 0 1  PHQ - 2 Score 0 1 2  Altered sleeping 0 0 1  Tired, decreased energy 0 1 2  Change in appetite 0 1 1  Feeling bad or failure about yourself  0 0 1  Trouble  concentrating 0 0 1  Moving slowly or fidgety/restless 0 0 1  Suicidal thoughts 0 0 0  PHQ-9 Score 0 3 9  Difficult doing work/chores Not difficult at all Somewhat difficult Somewhat difficult    Social History   Tobacco Use   Smoking status: Former    Current packs/day: 0.00    Types: Cigarettes    Quit date: 09/15/2004    Years since quitting: 18.4   Smokeless tobacco: Never  Vaping Use   Vaping status: Former   Substances: Flavoring  Substance Use Topics   Alcohol use: Yes    Comment: occasional   Drug use: No    Review of Systems Per HPI unless specifically indicated above     Objective:    BP 110/76   Pulse 71   Temp (!) 96.9 F (36.1 C) (Temporal)   Ht 5\' 2"  (1.575 m)   Wt 136 lb 6.4 oz (61.9 kg)   SpO2 100%   BMI 24.95 kg/m   Wt Readings from Last 3 Encounters:  02/08/23 136 lb 6.4 oz (61.9 kg)  02/06/23 140 lb (63.5 kg)  07/02/22 131 lb (59.4 kg)    Physical Exam Vitals and nursing note reviewed.  Constitutional:      General: She is not in acute distress.    Appearance: She is well-developed. She is not diaphoretic.  Comments: Well-appearing, comfortable, cooperative  HENT:     Head: Normocephalic and atraumatic.     Right Ear: Tympanic membrane, ear canal and external ear normal. There is no impacted cerumen.     Left Ear: Tympanic membrane, ear canal and external ear normal. There is no impacted cerumen.  Eyes:     General:        Right eye: No discharge.        Left eye: No discharge.     Conjunctiva/sclera: Conjunctivae normal.  Neck:     Thyroid: No thyromegaly.  Cardiovascular:     Rate and Rhythm: Normal rate and regular rhythm.     Heart sounds: Normal heart sounds. No murmur heard. Pulmonary:     Effort: Pulmonary effort is normal. No respiratory distress.     Breath sounds: No wheezing or rales.     Comments: Mild coarse or tight breath sounds. No cough today Abdominal:     General: Bowel sounds are normal. There is no  distension.     Palpations: There is no mass.     Tenderness: There is no abdominal tenderness. There is no guarding.  Musculoskeletal:        General: Normal range of motion.     Cervical back: Normal range of motion and neck supple.  Lymphadenopathy:     Cervical: No cervical adenopathy.  Skin:    General: Skin is warm and dry.     Findings: No erythema or rash.  Neurological:     Mental Status: She is alert and oriented to person, place, and time.  Psychiatric:        Behavior: Behavior normal.     Comments: Well groomed, good eye contact, normal speech and thoughts     I have personally reviewed the radiology report from 02/06/23 CXR.  CLINICAL DATA:  Provided history: Cough for two weeks. Additional history provided: Chest pain with coughing. Body aches.   EXAM: CHEST - 2 VIEW   COMPARISON:  None.   FINDINGS: Heart size within normal limits. No appreciable airspace consolidation. No evidence of pleural effusion or pneumothorax. No acute osseous abnormality identified. Degenerative changes of the spine.   IMPRESSION: No evidence of active cardiopulmonary disease.     Electronically Signed   By: Jackey Loge D.O.   On: 02/06/2023 14:28  -------------  I have personally reviewed the radiology report from 02/06/23 CT Abdomen  CLINICAL DATA:  Abdominal pain.  Nausea vomiting and diarrhea.   EXAM: CT ABDOMEN AND PELVIS WITH CONTRAST   TECHNIQUE: Multidetector CT imaging of the abdomen and pelvis was performed using the standard protocol following bolus administration of intravenous contrast.   RADIATION DOSE REDUCTION: This exam was performed according to the departmental dose-optimization program which includes automated exposure control, adjustment of the mA and/or kV according to patient size and/or use of iterative reconstruction technique.   CONTRAST:  OMNIPAQUE IOHEXOL 300 MG/ML  SOLN   COMPARISON:  CT abdomen pelvis dated 11/19/2019.    FINDINGS: Lower chest: The visualized lung bases are clear.   No intra-abdominal free air or free fluid.   Hepatobiliary: The liver is unremarkable. No biliary dilatation. The gallbladder is unremarkable.   Pancreas: Unremarkable. No pancreatic ductal dilatation or surrounding inflammatory changes.   Spleen: Normal in size without focal abnormality.   Adrenals/Urinary Tract: The adrenal glands are unremarkable. The kidneys, visualized ureters, and urinary bladder appear unremarkable.   Stomach/Bowel: Mild thickened appearance of the colon, likely related to underdistention.  Mild colitis is less likely. Clinical correlation is recommended. There is no bowel obstruction. The appendix is normal.   Vascular/Lymphatic: The abdominal aorta and IVC are unremarkable. No portal venous gas. There is no adenopathy.   Reproductive: The uterus is anteverted and grossly unremarkable. No adnexal masses.   Other: None   Musculoskeletal: No acute or significant osseous findings.   IMPRESSION: Underdistention of the colon versus less likely mild colitis. No bowel obstruction. Normal appendix.     Electronically Signed   By: Elgie Collard M.D.   On: 02/06/2023 16:36  Results for orders placed or performed during the hospital encounter of 02/06/23  Resp panel by RT-PCR (RSV, Flu A&B, Covid) Anterior Nasal Swab   Specimen: Anterior Nasal Swab  Result Value Ref Range   SARS Coronavirus 2 by RT PCR NEGATIVE NEGATIVE   Influenza A by PCR NEGATIVE NEGATIVE   Influenza B by PCR NEGATIVE NEGATIVE   Resp Syncytial Virus by PCR NEGATIVE NEGATIVE  Comprehensive metabolic panel  Result Value Ref Range   Sodium 137 135 - 145 mmol/L   Potassium 3.8 3.5 - 5.1 mmol/L   Chloride 103 98 - 111 mmol/L   CO2 25 22 - 32 mmol/L   Glucose, Bld 133 (H) 70 - 99 mg/dL   BUN 10 6 - 20 mg/dL   Creatinine, Ser 6.29 0.44 - 1.00 mg/dL   Calcium 9.1 8.9 - 52.8 mg/dL   Total Protein 7.6 6.5 - 8.1 g/dL    Albumin 4.5 3.5 - 5.0 g/dL   AST 26 15 - 41 U/L   ALT 21 0 - 44 U/L   Alkaline Phosphatase 38 38 - 126 U/L   Total Bilirubin 0.9 0.3 - 1.2 mg/dL   GFR, Estimated >41 >32 mL/min   Anion gap 9 5 - 15  CBC with Differential  Result Value Ref Range   WBC 6.4 4.0 - 10.5 K/uL   RBC 4.54 3.87 - 5.11 MIL/uL   Hemoglobin 14.2 12.0 - 15.0 g/dL   HCT 44.0 10.2 - 72.5 %   MCV 89.0 80.0 - 100.0 fL   MCH 31.3 26.0 - 34.0 pg   MCHC 35.1 30.0 - 36.0 g/dL   RDW 36.6 44.0 - 34.7 %   Platelets 294 150 - 400 K/uL   nRBC 0.0 0.0 - 0.2 %   Neutrophils Relative % 56 %   Neutro Abs 3.7 1.7 - 7.7 K/uL   Lymphocytes Relative 35 %   Lymphs Abs 2.2 0.7 - 4.0 K/uL   Monocytes Relative 6 %   Monocytes Absolute 0.4 0.1 - 1.0 K/uL   Eosinophils Relative 2 %   Eosinophils Absolute 0.1 0.0 - 0.5 K/uL   Basophils Relative 1 %   Basophils Absolute 0.0 0.0 - 0.1 K/uL   Immature Granulocytes 0 %   Abs Immature Granulocytes 0.01 0.00 - 0.07 K/uL  Urinalysis, Routine w reflex microscopic -Urine, Clean Catch  Result Value Ref Range   Color, Urine STRAW (A) YELLOW   APPearance CLEAR (A) CLEAR   Specific Gravity, Urine 1.003 (L) 1.005 - 1.030   pH 7.0 5.0 - 8.0   Glucose, UA NEGATIVE NEGATIVE mg/dL   Hgb urine dipstick NEGATIVE NEGATIVE   Bilirubin Urine NEGATIVE NEGATIVE   Ketones, ur NEGATIVE NEGATIVE mg/dL   Protein, ur NEGATIVE NEGATIVE mg/dL   Nitrite NEGATIVE NEGATIVE   Leukocytes,Ua NEGATIVE NEGATIVE  Lipase, blood  Result Value Ref Range   Lipase 36 11 - 51 U/L  POC urine preg,  ED  Result Value Ref Range   Preg Test, Ur Negative Negative      Assessment & Plan:   Problem List Items Addressed This Visit   None   No orders of the defined types were placed in this encounter.     Follow up plan: No follow-ups on file.  Future labs ordered for ***    Saralyn Pilar, DO [facility] 02/08/2023, 1:39 PM

## 2023-02-08 NOTE — Patient Instructions (Addendum)
Thank you for coming to the office today.  Likely cough, following recent viral or sinus infection  Trial on Prednisone taper for cough, this can present like an asthma flare up which is common for lingering cough.  Albuterol rescue as needed 2 puffs every 4-6 hours if coughing  Start Tessalon Perls take 1 capsule nightly for cough or can take during day if need  IF NOT IMPROVING Start Azithromycin Z pak (antibiotic) 2 tabs day 1, then 1 tab x 4 days, complete entire course even if improved  Colitis abdominal symptoms should resolve in few days to weeks. Okay to stop Metoclopramide for nausea once it improves. Prefer to discontinue this sooner  OTC Peppermint Oil (Triple Coated Capsule) 180mg  take one 3 times daily to reduce diarrhea   Please schedule a Follow-up Appointment to: Return if symptoms worsen or fail to improve.  If you have any other questions or concerns, please feel free to call the office or send a message through MyChart. You may also schedule an earlier appointment if necessary.  Additionally, you may be receiving a survey about your experience at our office within a few days to 1 week by e-mail or mail. We value your feedback.  Saralyn Pilar, DO [facility], [place]

## 2023-02-09 ENCOUNTER — Encounter: Payer: Self-pay | Admitting: Family Medicine

## 2023-03-02 ENCOUNTER — Other Ambulatory Visit: Payer: Self-pay | Admitting: Family Medicine

## 2023-03-02 DIAGNOSIS — J0110 Acute frontal sinusitis, unspecified: Secondary | ICD-10-CM

## 2023-03-04 NOTE — Telephone Encounter (Signed)
Requested Prescriptions  Pending Prescriptions Disp Refills   albuterol (VENTOLIN HFA) 108 (90 Base) MCG/ACT inhaler [Pharmacy Med Name: ALBUTEROL HFA (PROAIR) INHALER] 8.5 each 2    Sig: INHALE 2 PUFFS INTO THE LUNGS EVERY 4 HOURS AS NEEDED FOR WHEEZING OR SHORTNESS OF BREATH.     Pulmonology:  Beta Agonists 2 Passed - 03/02/2023  9:12 AM      Passed - Last BP in normal range    BP Readings from Last 1 Encounters:  02/08/23 110/76         Passed - Last Heart Rate in normal range    Pulse Readings from Last 1 Encounters:  02/08/23 71         Passed - Valid encounter within last 12 months    Recent Outpatient Visits           3 weeks ago Acute non-recurrent frontal sinusitis   [place] [facility] [place], Netta Neat, DO   1 month ago Gastroesophageal reflux disease without esophagitis   [place] [facility] [place], Salvadore Oxford, NP   8 months ago Paresthesia of right upper and lower extremity   [place] [facility] [place], Salvadore Oxford, NP   9 months ago Muscle cramps   [place] [facility] [place], Salvadore Oxford, NP   11 months ago Encounter for general adult medical examination with abnormal findings    [facility] [place], Salvadore Oxford, NP

## 2023-03-05 ENCOUNTER — Other Ambulatory Visit: Payer: Self-pay | Admitting: Internal Medicine

## 2023-03-06 NOTE — Telephone Encounter (Signed)
Requested medication (s) are due for refill today: yes  Requested medication (s) are on the active medication list: yes  Last refill:  01/31/23  Future visit scheduled: no  Notes to clinic:  Unable to refill per protocol, cannot delegate.      Requested Prescriptions  Pending Prescriptions Disp Refills   tiZANidine (ZANAFLEX) 4 MG tablet [Pharmacy Med Name: TIZANIDINE HCL 4 MG TABLET] 30 tablet 0    Sig: TAKE 1 CAPSULE (4 MG TOTAL) BY MOUTH DAILY AS NEEDED FOR MUSCLE SPASMS.     Not Delegated - Cardiovascular:  Alpha-2 Agonists - tizanidine Failed - 03/05/2023  2:30 AM      Failed - This refill cannot be delegated      Passed - Valid encounter within last 6 months    Recent Outpatient Visits           3 weeks ago Acute non-recurrent frontal sinusitis   [place] [facility] [place], Netta Neat, DO   1 month ago Gastroesophageal reflux disease without esophagitis   [place] [facility] [place], Salvadore Oxford, NP   8 months ago Paresthesia of right upper and lower extremity   [place] [facility] [place], Salvadore Oxford, NP   9 months ago Muscle cramps   [place] [facility] [place], Salvadore Oxford, NP   11 months ago Encounter for general adult medical examination with abnormal findings    [facility] [place], Salvadore Oxford, NP

## 2023-03-15 ENCOUNTER — Ambulatory Visit (INDEPENDENT_AMBULATORY_CARE_PROVIDER_SITE_OTHER): Payer: 59 | Admitting: Internal Medicine

## 2023-03-15 ENCOUNTER — Ambulatory Visit: Payer: Self-pay

## 2023-03-15 ENCOUNTER — Encounter: Payer: Self-pay | Admitting: Internal Medicine

## 2023-03-15 VITALS — BP 136/86 | HR 89 | Temp 96.0°F | Wt 137.0 lb

## 2023-03-15 DIAGNOSIS — J069 Acute upper respiratory infection, unspecified: Secondary | ICD-10-CM | POA: Diagnosis not present

## 2023-03-15 DIAGNOSIS — J301 Allergic rhinitis due to pollen: Secondary | ICD-10-CM

## 2023-03-15 LAB — POC COVID19 BINAXNOW: SARS Coronavirus 2 Ag: NEGATIVE

## 2023-03-15 NOTE — Patient Instructions (Signed)

## 2023-03-15 NOTE — Progress Notes (Signed)
HPI  Discussed the use of AI scribe software for clinical note transcription with the patient, who gave verbal consent to proceed.  History of Present Illness   The patient presents to the clinic today with c/o chough, chest congestion, wheezing and diarrhea. They report waking up from sleep due to an 'asthma attack,' which involved vomiting mucus and experiencing significant chest congestion. This episode was severe enough to cause them to pull over twice while driving due to dry heaving. The sputum they coughed up was initially yellow, then white, and is now clear. They also report pressure in the right ear, shortness of breath, and body aches. They deny fever and chills. 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 They have been managing their symptoms with an albuterol inhaler, Mucinex, and Alka-Seltzer Plus. They have no known history of asthma and do not smoke. They have been experiencing these symptoms for approximately two months, with previous treatments including prednisone, azithromycin, cough tablets, and the albuterol pump providing only temporary relief.       Review of Systems      Past Medical History:  Diagnosis Date   Anxiety    Arthritis    Depression    Urinary urgency     Family History  Problem Relation Age of Onset   Diabetes Mother    Depression Mother    Aneurysm Brother    Other Father        "some rare disease"   Bladder Cancer Neg Hx    Prostate cancer Neg Hx    Kidney cancer Neg Hx     Social History   Socioeconomic History   Marital status: Significant Other    Spouse name: Not on file   Number of children: Not on file   Years of education: Not on file   Highest education level: Not on file  Occupational History   Not on file  Tobacco Use   Smoking status: Former    Current packs/day: 0.00    Types: Cigarettes    Quit date: 09/15/2004    Years since quitting: 18.5   Smokeless tobacco: Never  Vaping Use   Vaping status:  Former   Substances: Flavoring  Substance and Sexual Activity   Alcohol use: Yes    Comment: occasional   Drug use: No   Sexual activity: Not on file  Other Topics Concern   Not on file  Social History Narrative   Not on file   Social Determinants of Health   Financial Resource Strain: Not on file  Food Insecurity: Not on file  Transportation Needs: Not on file  Physical Activity: Not on file  Stress: Not on file  Social Connections: Not on file  Intimate Partner Violence: Not on file    Allergies  Allergen Reactions   Latex Rash and Hives   Amoxicillin    Motrin [Ibuprofen] Other (See Comments)    Rectal bleeding.     Constitutional: Positive headache, fatigue and fever. Denies headache, fatigue, fever or abrupt weight changes.  HEENT:  Positive right ear pain denies eye redness, eye pain, pressure behind the eyes, facial pain, nasal congestion, ringing in the ears, wax buildup, runny nose, nasal congestion, bloody nose sore throat. Respiratory: Positive cough, chest congestion, wheezing and shortness of breath. Denies difficulty breathing.  Cardiovascular: Denies chest pain, chest tightness, palpitations or swelling in the hands or feet.   No other specific complaints in a complete review of systems (except as listed  in HPI above).  Objective:   BP (!) 142/98 (BP Location: Right Arm, Patient Position: Sitting, Cuff Size: Normal)   Pulse 89   Temp (!) 96 F (35.6 C) (Temporal)   Wt 137 lb (62.1 kg)   SpO2 97%   BMI 25.06 kg/m   Wt Readings from Last 3 Encounters:  02/08/23 136 lb 6.4 oz (61.9 kg)  02/06/23 140 lb (63.5 kg)  07/02/22 131 lb (59.4 kg)     General: Appears her stated age, overweight, in NAD. HEENT: Head: normal shape and size, no sinus tenderness noted; Eyes: sclera white, no icterus, conjunctiva pink; Ears: Tm's gray and intact, normal light reflex; Nose: mucosa boggy and moist, turbinates swollen, septum midline; Throat/Mouth: + PND. Teeth  present, mucosa erythematous and moist, no exudate noted, no lesions or ulcerations noted.  Neck: No cervical lymphadenopathy.  Cardiovascular: Normal rate and rhythm. S1,S2 noted.  No murmur, rubs or gallops noted.  Pulmonary/Chest: Normal effort and positive vesicular breath sounds. No respiratory distress. No wheezes, rales or ronchi noted.       Assessment & Plan:   Assessment and Plan    Upper Respiratory Symptoms Persistent cough and chest congestion with yellow and white sputum. No wheezing on exam. History of similar symptoms in the past. Possible reactive airway disease. -Rapid covid negative -Continue Albuterol inhaler as needed. -Start Flonase or Nasacort over the counter for potential postnasal drip. -Continue daily Claritin.        RTC in 2 months for your annual exam   Nicki Reaper, NP

## 2023-03-15 NOTE — Telephone Encounter (Signed)
    Chief Complaint: Cough, congestion with yellow mucus, SOB with exertion. Symptoms: Above Frequency: This week Pertinent Negatives: Patient denies fever Disposition: [] ED /[] Urgent Care (no appt availability in office) / [x] Appointment(In office/virtual)/ []  [place] [facility]/ [] Home Care/ [] Refused Recommended Disposition /[] [place] [facility]/ []  Follow-up with PCP Additional Notes: Pt. Agrees with appointment.  Reason for Disposition  [1] MILD difficulty breathing (e.g., minimal/no SOB at rest, SOB with walking, pulse <100) AND [2] still present when not coughing  Answer Assessment - Initial Assessment Questions 1. ONSET: "When did the cough begin?"      This week 2. SEVERITY: "How bad is the cough today?"      Severe 3. SPUTUM: "Describe the color of your sputum" (none, dry cough; clear, white, yellow, green)     Yellow 4. HEMOPTYSIS: "Are you coughing up any blood?" If so ask: "How much?" (flecks, streaks, tablespoons, etc.)     No 5. DIFFICULTY BREATHING: "Are you having difficulty breathing?" If Yes, ask: "How bad is it?" (e.g., mild, moderate, severe)    - MILD: No SOB at rest, mild SOB with walking, speaks normally in sentences, can lie down, no retractions, pulse < 100.    - MODERATE: SOB at rest, SOB with minimal exertion and prefers to sit, cannot lie down flat, speaks in phrases, mild retractions, audible wheezing, pulse 100-120.    - SEVERE: Very SOB at rest, speaks in single words, struggling to breathe, sitting hunched forward, retractions, pulse > 120      Mild-moderate 6. FEVER: "Do you have a fever?" If Yes, ask: "What is your temperature, how was it measured, and when did it start?"     No 7. CARDIAC HISTORY: "Do you have any history of heart disease?" (e.g., heart attack, congestive heart failure)      No 8. LUNG HISTORY: "Do you have any history of lung disease?"  (e.g., pulmonary embolus, asthma, emphysema)     Asthma 9. PE RISK FACTORS: "Do you  have a history of blood clots?" (or: recent major surgery, recent prolonged travel, bedridden)     No 10. OTHER SYMPTOMS: "Do you have any other symptoms?" (e.g., runny nose, wheezing, chest pain)       No 11. PREGNANCY: "Is there any chance you are pregnant?" "When was your last menstrual period?"       No 12. TRAVEL: "Have you traveled out of the country in the last month?" (e.g., travel history, exposures)       No  Protocols used: Cough - Acute Productive-A-AH

## 2023-05-16 ENCOUNTER — Ambulatory Visit (INDEPENDENT_AMBULATORY_CARE_PROVIDER_SITE_OTHER): Payer: 59 | Admitting: Internal Medicine

## 2023-05-16 ENCOUNTER — Encounter: Payer: Self-pay | Admitting: Internal Medicine

## 2023-05-16 VITALS — BP 130/84 | Ht 62.0 in | Wt 134.0 lb

## 2023-05-16 DIAGNOSIS — Z136 Encounter for screening for cardiovascular disorders: Secondary | ICD-10-CM | POA: Diagnosis not present

## 2023-05-16 DIAGNOSIS — R739 Hyperglycemia, unspecified: Secondary | ICD-10-CM | POA: Diagnosis not present

## 2023-05-16 DIAGNOSIS — Z0001 Encounter for general adult medical examination with abnormal findings: Secondary | ICD-10-CM | POA: Diagnosis not present

## 2023-05-16 DIAGNOSIS — M940 Chondrocostal junction syndrome [Tietze]: Secondary | ICD-10-CM

## 2023-05-16 DIAGNOSIS — Z1231 Encounter for screening mammogram for malignant neoplasm of breast: Secondary | ICD-10-CM

## 2023-05-16 NOTE — Patient Instructions (Signed)
 Costochondritis  Costochondritis is irritation and swelling (inflammation) of the tissue that connects the ribs to the breastbone (sternum). This tissue is called cartilage. This condition causes pain in the front of the chest. The pain often starts slowly. It may be in more than one rib. What are the causes? The cause of this condition is not always known. It can come from stress on the sternum. The cause of this stress could be: Chest injury. Exercise or activity. This may include lifting. Very bad coughing. What increases the risk? Being female. Being 31-68 years old. Starting a new exercise or work activity. Having low levels of vitamin D. Having a condition that makes you cough a lot. What are the signs or symptoms? Chest pain that: Starts slowly. It can be sharp or dull. Gets worse with deep breathing, coughing, or exercise. Gets better with rest. May be worse when you press on your ribs and breastbone. How is this treated? In most cases, this condition goes away on its own over time. You may need to take an NSAID, such as ibuprofen. This can help reduce pain. You may also need to: Rest and stay away from activities that make pain worse. Put heat or ice on the area that hurts. Do exercises to stretch your chest muscles. If these treatments do not help, your doctor may inject a medicine to numb the area. This can help relieve the pain. Follow these instructions at home: Managing pain, stiffness, and swelling     If told, put ice on the painful area. To do this: Put ice in a plastic bag. Place a towel between your skin and the bag. Leave the ice on for 20 minutes, 2-3 times a day. If told, put heat on the affected area. Do this as often as told by your doctor. Use the heat source that your doctor recommends, such as a moist heat pack or a heating pad. Place a towel between your skin and the heat source. Leave the heat on for 20-30 minutes. If your skin turns bright red,  take off the ice or heat right away to prevent skin damage. The risk of skin damage is higher if you cannot feel pain, heat, or cold. Activity Rest as told by your doctor. Do not do things that make your pain worse. This includes activities that use your chest, belly (abdomen), and side muscles. You may have to avoid lifting. Ask your doctor how much you can safely lift. Return to your normal activities when your doctor says that it is safe. General instructions Take over-the-counter and prescription medicines only as told by your doctor. Contact a doctor if: You have chills or a fever. Your pain does not go away or gets worse. You have a cough that does not go away. Get help right away if: You have a hard time breathing. You have very bad chest pain that does not get better with medicines, heat, or ice. These symptoms may be an emergency. Get help right away. Call 911. Do not wait to see if the symptoms will go away. Do not drive yourself to the hospital. This information is not intended to replace advice given to you by your health care provider. Make sure you discuss any questions you have with your health care provider. Document Revised: 12/28/2021 Document Reviewed: 12/28/2021 Elsevier Patient Education  2024 [employer].

## 2023-05-16 NOTE — Progress Notes (Signed)
Subjective:    Patient ID: Cindy Bonilla, female    DOB: Jun 13, 1981, 42 y.o.   MRN: [id number]  HPI  Patient presents to clinic today for her annual exam.  Flu: never Tetanus: 09/2017 COVID: x2 Pap smear: 09/2019 Mammogram: 05/2022 Vision screening: as needed Dentist: biannually  Diet: She does eat some meat. She consumes fruits and veggies. She ties to avoid fried foods. She drinks mostly water, coffee. Exercise: Walking  Review of Systems  Past Medical History:  Diagnosis Date   Anxiety    Arthritis    Depression    Urinary urgency     Current Outpatient Medications  Medication Sig Dispense Refill   albuterol (VENTOLIN HFA) 108 (90 Base) MCG/ACT inhaler INHALE 2 PUFFS INTO THE LUNGS EVERY 4 HOURS AS NEEDED FOR WHEEZING OR SHORTNESS OF BREATH. 8.5 each 2   busPIRone (BUSPAR) 10 MG tablet TAKE 2 TABLETS BY MOUTH TWICE A DAY 360 tablet 1   dicyclomine (BENTYL) 20 MG tablet TAKE 1 TABLET BY MOUTH THREE TIMES DAILY AS NEEDED FOR SPASMS. 90 tablet 1   metoCLOPramide (REGLAN) 10 MG tablet Take 1 tablet (10 mg total) by mouth 3 (three) times daily with meals for 10 days. 30 tablet 0   norgestrel-ethinyl estradiol (LOW-OGESTREL) 0.3-30 MG-MCG tablet TAKE 1 TABLET BY MOUTH EVERY DAY 84 tablet 2   omeprazole (PRILOSEC) 20 MG capsule TAKE 1 CAPSULE (20 MG TOTAL) BY MOUTH 2 (TWO) TIMES DAILY BEFORE A MEAL. 180 capsule 0   tiZANidine (ZANAFLEX) 4 MG tablet TAKE 1 CAPSULE (4 MG TOTAL) BY MOUTH DAILY AS NEEDED FOR MUSCLE SPASMS. 30 tablet 0   No current facility-administered medications for this visit.    Allergies  Allergen Reactions   Latex Rash and Hives   Amoxicillin    Motrin [Ibuprofen] Other (See Comments)    Rectal bleeding.    Family History  Problem Relation Age of Onset   Diabetes Mother    Depression Mother    Aneurysm Brother    Other Father        "some rare disease"   Bladder Cancer Neg Hx    Prostate cancer Neg Hx    Kidney cancer Neg Hx     Social  History   Socioeconomic History   Marital status: Significant Other    Spouse name: Not on file   Number of children: Not on file   Years of education: Not on file   Highest education level: Not on file  Occupational History   Not on file  Tobacco Use   Smoking status: Former    Current packs/day: 0.00    Types: Cigarettes    Quit date: 09/15/2004    Years since quitting: 18.6   Smokeless tobacco: Never  Vaping Use   Vaping status: Former   Substances: Flavoring  Substance and Sexual Activity   Alcohol use: Yes    Comment: occasional   Drug use: No   Sexual activity: Not on file  Other Topics Concern   Not on file  Social History Narrative   Not on file   Social Determinants of Health   Financial Resource Strain: Not on file  Food Insecurity: Not on file  Transportation Needs: Not on file  Physical Activity: Not on file  Stress: Not on file  Social Connections: Not on file  Intimate Partner Violence: Not on file     Constitutional: Denies fever, malaise, fatigue, headache or abrupt weight changes.  HEENT: Denies eye pain, eye redness, ear  pain, ringing in the ears, wax buildup, runny nose, nasal congestion, bloody nose, or sore throat. Respiratory: Denies difficulty breathing, shortness of breath, cough or sputum production.   Cardiovascular: Denies chest pain, chest tightness, palpitations or swelling in the hands or feet.  Gastrointestinal: Denies abdominal pain, bloating, constipation, diarrhea or blood in the stool.  GU: Denies urgency, frequency, pain with urination, burning sensation, blood in urine, odor or discharge. Musculoskeletal: Patient reports chronic back pain, left upper chest wall pain.  Denies decrease in range of motion, difficulty with gait, or joint swelling.  Skin: Denies redness, rashes, lesions or ulcercations.  Neurological: Patient reports insomnia.  Denies dizziness, difficulty with memory, difficulty with speech or problems with balance and  coordination.  Psych: Patient has a history of anxiety and depression.  Denies SI/HI.  No other specific complaints in a complete review of systems (except as listed in HPI above).     Objective:   Physical Exam  BP 130/84   Ht 5\' 2"  (1.575 m)   Wt 134 lb (60.8 kg)   BMI 24.51 kg/m    Wt Readings from Last 3 Encounters:  03/15/23 137 lb (62.1 kg)  02/08/23 136 lb 6.4 oz (61.9 kg)  02/06/23 140 lb (63.5 kg)    General: Appears her stated age, well developed, well nourished in NAD. Skin: Warm, dry and intact.  HEENT: Head: normal shape and size; Eyes: sclera white, no icterus, conjunctiva pink, PERRLA and EOMs intact;  Neck:  Neck supple, trachea midline. No masses, lumps or thyromegaly present.  Cardiovascular: Normal rate and rhythm. S1,S2 noted.  No murmur, rubs or gallops noted. No JVD or BLE edema.  Pulmonary/Chest: Normal effort and positive vesicular breath sounds. No respiratory distress. No wheezes, rales or ronchi noted.  Abdomen: Normal bowel sounds. Musculoskeletal: Pain with palpation of the intercostal spaces of her left upper chest wall.  Strength 5/5 BUE/BLE. No difficulty with gait.  Neurological: Alert and oriented. Cranial nerves II-XII grossly intact. Coordination normal.  Psychiatric: Mood and affect normal. Behavior is normal. Judgment and thought content normal.   BMET    Component Value Date/Time   NA 137 02/06/2023 1425   NA 142 10/18/2015 1047   K 3.8 02/06/2023 1425   CL 103 02/06/2023 1425   CO2 25 02/06/2023 1425   GLUCOSE 133 (H) 02/06/2023 1425   BUN 10 02/06/2023 1425   BUN 6 10/18/2015 1047   CREATININE 0.83 02/06/2023 1425   CREATININE 1.00 (H) 05/22/2022 1601   CALCIUM 9.1 02/06/2023 1425   GFRNONAA >60 02/06/2023 1425   GFRNONAA 70 12/05/2020 0955   GFRAA 81 12/05/2020 0955    Lipid Panel     Component Value Date/Time   CHOL 161 05/22/2022 1601   CHOL 156 10/18/2015 1047   TRIG 90 05/22/2022 1601   HDL 67 05/22/2022 1601    HDL 48 10/18/2015 1047   CHOLHDL 2.4 05/22/2022 1601   LDLCALC 77 05/22/2022 1601    CBC    Component Value Date/Time   WBC 6.4 02/06/2023 1425   RBC 4.54 02/06/2023 1425   HGB 14.2 02/06/2023 1425   HCT 40.4 02/06/2023 1425   PLT 294 02/06/2023 1425   MCV 89.0 02/06/2023 1425   MCH 31.3 02/06/2023 1425   MCHC 35.1 02/06/2023 1425   RDW 11.9 02/06/2023 1425   LYMPHSABS 2.2 02/06/2023 1425   MONOABS 0.4 02/06/2023 1425   EOSABS 0.1 02/06/2023 1425   BASOSABS 0.0 02/06/2023 1425    Hgb A1C Lab  Results  Component Value Date   HGBA1C 5.4 04/11/2021           Assessment & Plan:   Preventative Health Maintenance:  She declines flu shot today Tetanus UTD Encouraged her to get her COVID booster Pap smear due but on menses today, will reschedule Mammogram ordered- she will call to schedule  Encouraged her to consume a balanced diet and exercise regimen Advised her to see an eye doctor and dentist annually We will check CBC, c-Met, lipid profile and A1c today  Costochondritis:  Encourage ibuprofen 600 mg every 8 hours as needed-consume with food  RTC in 6 months, follow-up chronic conditions Nicki Reaper, NP

## 2023-05-17 LAB — LIPID PANEL
Cholesterol: 165 mg/dL (ref <200)
HDL: 75 mg/dL (ref >=50)
LDL Cholesterol (Calc): 75 mg/dL
Non-HDL Cholesterol (Calc): 90 mg/dL (ref <130)
Total CHOL/HDL Ratio: 2.2 (calc) (ref <5.0)
Triglycerides: 69 mg/dL (ref <150)

## 2023-05-17 LAB — HEMOGLOBIN A1C
Hgb A1c MFr Bld: 5.3 %{Hb} (ref <5.7)
Mean Plasma Glucose: 105 mg/dL
eAG (mmol/L): 5.8 mmol/L

## 2023-05-17 LAB — CBC
HCT: 44.1 % (ref 35.0–45.0)
Hemoglobin: 14.7 g/dL (ref 11.7–15.5)
MCH: 31.3 pg (ref 27.0–33.0)
MCHC: 33.3 g/dL (ref 32.0–36.0)
MCV: 94 fL (ref 80.0–100.0)
MPV: 11.1 fL (ref 7.5–12.5)
Platelets: 259 10*3/uL (ref 140–400)
RBC: 4.69 10*6/uL (ref 3.80–5.10)
RDW: 11.7 % (ref 11.0–15.0)
WBC: 3.8 10*3/uL (ref 3.8–10.8)

## 2023-05-17 LAB — COMPLETE METABOLIC PANEL WITH GFR
AG Ratio: 1.7 (calc) (ref 1.0–2.5)
ALT: 23 U/L (ref 6–29)
AST: 23 U/L (ref 10–30)
Albumin: 4.1 g/dL (ref 3.6–5.1)
Alkaline phosphatase (APISO): 45 U/L (ref 31–125)
BUN: 10 mg/dL (ref 7–25)
CO2: 28 mmol/L (ref 20–32)
Calcium: 8.7 mg/dL (ref 8.6–10.2)
Chloride: 99 mmol/L (ref 98–110)
Creat: 0.84 mg/dL (ref 0.50–0.99)
Globulin: 2.4 g/dL (ref 1.9–3.7)
Glucose, Bld: 132 mg/dL (ref 65–139)
Potassium: 4.3 mmol/L (ref 3.5–5.3)
Sodium: 139 mmol/L (ref 135–146)
Total Bilirubin: 0.3 mg/dL (ref 0.2–1.2)
Total Protein: 6.5 g/dL (ref 6.1–8.1)
eGFR: 88 mL/min/{1.73_m2} (ref >=60)

## 2023-05-29 ENCOUNTER — Other Ambulatory Visit: Payer: Self-pay | Admitting: Internal Medicine

## 2023-05-29 DIAGNOSIS — R252 Cramp and spasm: Secondary | ICD-10-CM

## 2023-05-31 NOTE — Telephone Encounter (Signed)
Reordered 01/31/23 #360 1 RF    Requested Prescriptions  Refused Prescriptions Disp Refills   busPIRone (BUSPAR) 10 MG tablet [Pharmacy Med Name: BUSPIRONE HCL 10 MG TABLET] 360 tablet 2    Sig: TAKE 2 TABLETS BY MOUTH TWICE A DAY     Psychiatry: Anxiolytics/Hypnotics - Non-controlled Passed - 05/29/2023  8:30 AM      Passed - Valid encounter within last 12 months    Recent Outpatient Visits           2 weeks ago Encounter for general adult medical examination with abnormal findings   [place] [facility] [place], [place] W, NP   2 months ago Seasonal allergic rhinitis due to pollen   [facility], Salvadore Oxford, NP   3 months ago Acute non-recurrent frontal sinusitis   [place] [facility] [place], Netta Neat, DO   4 months ago Gastroesophageal reflux disease without esophagitis   [place] [facility] [place], Salvadore Oxford, NP   11 months ago Paresthesia of right upper and lower extremity   [place] [facility] [place], Salvadore Oxford, NP       Future Appointments             In 5 months [place], Salvadore Oxford, NP [place] [facility], [facility]

## 2023-07-20 ENCOUNTER — Other Ambulatory Visit: Payer: Self-pay | Admitting: Internal Medicine

## 2023-07-20 DIAGNOSIS — R252 Cramp and spasm: Secondary | ICD-10-CM

## 2023-07-22 NOTE — Telephone Encounter (Signed)
Requested Prescriptions  Pending Prescriptions Disp Refills   busPIRone (BUSPAR) 10 MG tablet [Pharmacy Med Name: BUSPIRONE HCL 10 MG TABLET] 360 tablet 1    Sig: TAKE 2 TABLETS BY MOUTH TWICE A DAY     Psychiatry: Anxiolytics/Hypnotics - Non-controlled Passed - 07/22/2023  4:10 PM      Passed - Valid encounter within last 12 months    Recent Outpatient Visits           2 months ago Encounter for general adult medical examination with abnormal findings   [place] [facility] [place], [place], NP   4 months ago Seasonal allergic rhinitis due to pollen   [facility] [place], Salvadore Oxford, NP   5 months ago Acute non-recurrent frontal sinusitis   [place] [facility] [place], Netta Neat, DO   5 months ago Gastroesophageal reflux disease without esophagitis   [place] [facility] [place], Salvadore Oxford, NP   1 year ago Paresthesia of right upper and lower extremity   [place] [facility] [place], Salvadore Oxford, NP       Future Appointments             In 3 months Baity, Salvadore Oxford, NP [place] [facility], [facility]

## 2023-08-01 ENCOUNTER — Ambulatory Visit
Admission: RE | Admit: 2023-08-01 | Discharge: 2023-08-01 | Disposition: A | Discharge status: Home / Self Care | Payer: 59 | Source: Ambulatory Visit | Attending: Internal Medicine | Admitting: Internal Medicine

## 2023-08-01 DIAGNOSIS — Z1231 Encounter for screening mammogram for malignant neoplasm of breast: Secondary | ICD-10-CM | POA: Insufficient documentation

## 2023-08-01 DIAGNOSIS — Z0001 Encounter for general adult medical examination with abnormal findings: Secondary | ICD-10-CM

## 2023-09-13 ENCOUNTER — Other Ambulatory Visit: Payer: Self-pay | Admitting: Internal Medicine

## 2023-09-13 DIAGNOSIS — R252 Cramp and spasm: Secondary | ICD-10-CM

## 2023-09-16 NOTE — Telephone Encounter (Signed)
 Requested Prescriptions  Refused Prescriptions Disp Refills   busPIRone (BUSPAR) 10 MG tablet [Pharmacy Med Name: BUSPIRONE HCL 10 MG TABLET] 360 tablet 2    Sig: TAKE 2 TABLETS BY MOUTH TWICE A DAY     Psychiatry: Anxiolytics/Hypnotics - Non-controlled Passed - 09/16/2023 11:20 AM      Passed - Valid encounter within last 12 months    Recent Outpatient Visits           4 months ago Encounter for general adult medical examination with abnormal findings   [place] [facility] [place], [place], NP   6 months ago Seasonal allergic rhinitis due to pollen   [facility] [place], Salvadore Oxford, NP   7 months ago Acute non-recurrent frontal sinusitis   [place] [facility] [place], Netta Neat, DO   7 months ago Gastroesophageal reflux disease without esophagitis   [place] [facility] [place], Salvadore Oxford, NP   1 year ago Paresthesia of right upper and lower extremity   [place] [facility] [place], Salvadore Oxford, NP       Future Appointments             In 1 month [place], Salvadore Oxford, NP [place] [facility], [facility]

## 2023-09-27 ENCOUNTER — Other Ambulatory Visit: Payer: Self-pay | Admitting: Internal Medicine

## 2023-09-27 DIAGNOSIS — R252 Cramp and spasm: Secondary | ICD-10-CM

## 2023-09-27 NOTE — Telephone Encounter (Signed)
 Last OV 05/16/23 Requested Prescriptions  Pending Prescriptions Disp Refills   LOW-OGESTREL 0.3-30 MG-MCG tablet [Pharmacy Med Name: LOW-OGESTREL-28 TABLET] 84 tablet 2    Sig: TAKE 1 TABLET BY MOUTH EVERY DAY     OB/GYN:  Contraceptives Failed - 09/27/2023  3:20 PM      Failed - Valid encounter within last 12 months    Recent Outpatient Visits   None     Future Appointments             In 1 month Baity, Salvadore Oxford, NP False Pass [facility]            Passed - Last BP in normal range    BP Readings from Last 1 Encounters:  05/16/23 130/84         Passed - Patient is not a smoker

## 2023-11-13 ENCOUNTER — Ambulatory Visit: Payer: Self-pay | Admitting: Internal Medicine

## 2024-01-17 ENCOUNTER — Other Ambulatory Visit: Payer: Self-pay | Admitting: Internal Medicine

## 2024-01-17 DIAGNOSIS — R252 Cramp and spasm: Secondary | ICD-10-CM

## 2024-01-17 NOTE — Telephone Encounter (Signed)
 Courtesy refill. Patient will need an office visit for additional refills.  Requested Prescriptions  Pending Prescriptions Disp Refills   busPIRone  (BUSPAR ) 10 MG tablet [Pharmacy Med Name: BUSPIRONE  HCL 10 MG TABLET] 120 tablet 0    Sig: TAKE 2 TABLETS BY MOUTH TWICE A DAY     Psychiatry: Anxiolytics/Hypnotics - Non-controlled Failed - 01/17/2024  5:56 PM      Failed - Valid encounter within last 12 months    Recent Outpatient Visits   None

## 2024-02-12 ENCOUNTER — Other Ambulatory Visit: Payer: Self-pay | Admitting: Internal Medicine

## 2024-02-12 DIAGNOSIS — R252 Cramp and spasm: Secondary | ICD-10-CM

## 2024-02-13 NOTE — Telephone Encounter (Signed)
 Unable to refill per protocol, courtesy refill already given, OV needed.  Requested Prescriptions  Pending Prescriptions Disp Refills   busPIRone  (BUSPAR ) 10 MG tablet [Pharmacy Med Name: BUSPIRONE  HCL 10 MG TABLET] 360 tablet 1    Sig: TAKE 2 TABLETS BY MOUTH TWICE A DAY     Psychiatry: Anxiolytics/Hypnotics - Non-controlled Failed - 02/13/2024  2:38 PM      Failed - Valid encounter within last 12 months    Recent Outpatient Visits   None

## 2024-02-16 ENCOUNTER — Other Ambulatory Visit: Payer: Self-pay | Admitting: Internal Medicine

## 2024-02-16 DIAGNOSIS — R252 Cramp and spasm: Secondary | ICD-10-CM

## 2024-02-18 NOTE — Telephone Encounter (Signed)
 Requested medications are due for refill today.  yes  Requested medications are on the active medications list.  yes  Last refill. 01/17/2024 #120 0 rf  Future visit scheduled.   no  Notes to clinic.  Pt last seen 05/16/2023 - pt was to RTC in 6 months.     Requested Prescriptions  Pending Prescriptions Disp Refills   busPIRone  (BUSPAR ) 10 MG tablet [Pharmacy Med Name: BUSPIRONE  HCL 10 MG TABLET] 120 tablet 0    Sig: TAKE 2 TABLETS BY MOUTH TWICE A DAY     Psychiatry: Anxiolytics/Hypnotics - Non-controlled Failed - 02/18/2024  8:39 AM      Failed - Valid encounter within last 12 months    Recent Outpatient Visits   None

## 2024-02-27 ENCOUNTER — Encounter: Payer: Self-pay | Admitting: Internal Medicine

## 2024-02-27 ENCOUNTER — Telehealth (INDEPENDENT_AMBULATORY_CARE_PROVIDER_SITE_OTHER): Payer: Self-pay | Admitting: Internal Medicine

## 2024-02-27 DIAGNOSIS — G4700 Insomnia, unspecified: Secondary | ICD-10-CM

## 2024-02-27 DIAGNOSIS — K219 Gastro-esophageal reflux disease without esophagitis: Secondary | ICD-10-CM

## 2024-02-27 DIAGNOSIS — K580 Irritable bowel syndrome with diarrhea: Secondary | ICD-10-CM

## 2024-02-27 DIAGNOSIS — F419 Anxiety disorder, unspecified: Secondary | ICD-10-CM

## 2024-02-27 DIAGNOSIS — F32A Depression, unspecified: Secondary | ICD-10-CM

## 2024-02-27 DIAGNOSIS — F5101 Primary insomnia: Secondary | ICD-10-CM

## 2024-02-27 DIAGNOSIS — G8929 Other chronic pain: Secondary | ICD-10-CM

## 2024-02-27 DIAGNOSIS — M5441 Lumbago with sciatica, right side: Secondary | ICD-10-CM

## 2024-02-27 MED ORDER — BUSPIRONE HCL 10 MG PO TABS
20.0000 mg | ORAL_TABLET | Freq: Two times a day (BID) | ORAL | 1 refills | Status: DC
Start: 2024-02-27 — End: 2024-05-22

## 2024-02-27 NOTE — Assessment & Plan Note (Signed)
 Okay to continue OTC sleep aid as needed We will monitor

## 2024-02-27 NOTE — Assessment & Plan Note (Signed)
 Encouraged regular stretching and core strengthening Continue tizanidine  4 mg daily as needed She will continue to follow with orthopedics and chiropractor

## 2024-02-27 NOTE — Progress Notes (Signed)
 Virtual Visit via Video Note  I connected with Cindy Bonilla on 02/27/24 at  3:00 PM EDT by a video enabled telemedicine application and verified that I am speaking with the correct person using two identifiers.  Location: Patient: Home Provider: Home  Persons participating in this video call: Angeline Barks, NP and Cindy Bonilla   I discussed the limitations of evaluation and management by telemedicine and the availability of in person appointments. The patient expressed understanding and agreed to proceed.  History of Present Illness:  Patient due for follow-up of chronic conditions.  GERD: Triggered by greasy foods.  She takes omeprazole  as needed.  Upper GI from 10/2019 reviewed.  IBS: She reports mainly diarrhea. She takes dicyclomine  as needed.  Colonoscopy from 10/2019 reviewed.  Sciatica: She reports chronic low back pain that radiates into her right leg.  She takes tizanidine  as needed with some relief of symptoms.  MRI lumbar spine from 07/2020 reviewed.  She does not want to do back injections. She follows with orthopedics and chiropractor.  Insomnia: She has difficulty falling and staying asleep.  She takes an OTC sleep aid as needed with some relief of symptoms.  There is no sleep study on file.  Anxiety and depression: Chronic, managed on buspirone .  She is not currently seeing a therapist.  She denies SI/HI.     Past Medical History:  Diagnosis Date   Anxiety    Arthritis    Depression    Urinary urgency     Current Outpatient Medications  Medication Sig Dispense Refill   albuterol  (VENTOLIN  HFA) 108 (90 Base) MCG/ACT inhaler INHALE 2 PUFFS INTO THE LUNGS EVERY 4 HOURS AS NEEDED FOR WHEEZING OR SHORTNESS OF BREATH. 8.5 each 2   busPIRone  (BUSPAR ) 10 MG tablet TAKE 2 TABLETS BY MOUTH TWICE A DAY 120 tablet 0   dicyclomine  (BENTYL ) 20 MG tablet TAKE 1 TABLET BY MOUTH THREE TIMES DAILY AS NEEDED FOR SPASMS. 90 tablet 1   metoCLOPramide  (REGLAN ) 10 MG tablet Take 1  tablet (10 mg total) by mouth 3 (three) times daily with meals for 10 days. 30 tablet 0   norgestrel -ethinyl estradiol  (LOW-OGESTREL ) 0.3-30 MG-MCG tablet TAKE 1 TABLET BY MOUTH EVERY DAY 84 tablet 1   omeprazole  (PRILOSEC) 20 MG capsule TAKE 1 CAPSULE (20 MG TOTAL) BY MOUTH 2 (TWO) TIMES DAILY BEFORE A MEAL. 180 capsule 0   tiZANidine  (ZANAFLEX ) 4 MG tablet TAKE 1 CAPSULE (4 MG TOTAL) BY MOUTH DAILY AS NEEDED FOR MUSCLE SPASMS. 30 tablet 0   No current facility-administered medications for this visit.    Allergies  Allergen Reactions   Latex Rash and Hives   Amoxicillin     Motrin [Ibuprofen] Other (See Comments)    Rectal bleeding.    Family History  Problem Relation Age of Onset   Diabetes Mother    Depression Mother    Aneurysm Brother    Other Father        some rare disease   Bladder Cancer Neg Hx    Prostate cancer Neg Hx    Kidney cancer Neg Hx     Social History   Socioeconomic History   Marital status: Significant Other    Spouse name: Not on file   Number of children: Not on file   Years of education: Not on file   Highest education level: Not on file  Occupational History   Not on file  Tobacco Use   Smoking status: Former    Current packs/day: 0.00  Types: Cigarettes    Quit date: 09/15/2004    Years since quitting: 19.4   Smokeless tobacco: Never  Vaping Use   Vaping status: Former   Substances: Flavoring  Substance and Sexual Activity   Alcohol use: Yes    Comment: occasional   Drug use: No   Sexual activity: Not on file  Other Topics Concern   Not on file  Social History Narrative   Not on file   Social Drivers of Health   Financial Resource Strain: Medium Risk (05/16/2023)   Overall Financial Resource Strain (CARDIA)    Difficulty of Paying Living Expenses: Somewhat hard  Food Insecurity: Food Insecurity Present (05/16/2023)   Hunger Vital Sign    Worried About Running Out of Food in the Last Year: Sometimes true    Ran Out of Food  in the Last Year: Sometimes true  Transportation Needs: No Transportation Needs (05/16/2023)   PRAPARE - Administrator, Civil Service (Medical): No    Lack of Transportation (Non-Medical): No  Physical Activity: Insufficiently Active (05/16/2023)   Exercise Vital Sign    Days of Exercise per Week: 4 days    Minutes of Exercise per Session: 30 min  Stress: Stress Concern Present (05/16/2023)   [employer] of Occupational Health - Occupational Stress Questionnaire    Feeling of Stress : Rather much  Social Connections: Socially Integrated (05/16/2023)   Social Connection and Isolation Panel    Frequency of Communication with Friends and Family: More than three times a week    Frequency of Social Gatherings with Friends and Family: More than three times a week    Attends Religious Services: More than 4 times per year    Active Member of [employer] or Organizations: Yes    Attends Engineer, structural: More than 4 times per year    Marital Status: Married  Catering manager Violence: Not At Risk (05/16/2023)   Humiliation, Afraid, Rape, and Kick questionnaire    Fear of Current or Ex-Partner: No    Emotionally Abused: No    Physically Abused: No    Sexually Abused: No     Constitutional:  Denies fatigue, malaise, fever, headache or abrupt weight changes.  HEENT:  Denies eye pain, eye redness, ear pain, ringing in the ears, wax buildup, runny nose, nasal congestion bloody nose or sore throat. Respiratory:  Denies difficulty breathing, shortness of breath, cough or sputum production.   Cardiovascular: Denies chest pain, chest tightness, palpitations or swelling in the hands or feet.  Gastrointestinal: Patient reports intermittent reflux and diarrhea.  Denies abdominal pain, bloating, constipation, or blood in the stool.  GU: Denies urgency, frequency, pain with urination, burning sensation, blood in urine, odor or discharge. Musculoskeletal: Patient reports chronic  low back pain.  Denies decrease in range of motion, difficulty with gait, or joint swelling.  Skin: Denies redness, rashes, lesions or ulcercations.  Neurological: Patient reports insomnia.  Denies dizziness, difficulty with memory, difficulty with speech or problems with balance and coordination.  Psych: Patient has a history of anxiety and depression.  Denies SI/HI.  No other specific complaints in a complete review of systems (except as listed in HPI above).  Observations/Objective:   Wt Readings from Last 3 Encounters:  05/16/23 134 lb (60.8 kg)  03/15/23 137 lb (62.1 kg)  02/08/23 136 lb 6.4 oz (61.9 kg)    General: Appears her stated age, in NAD. Pulmonary/Chest: Normal effort. No respiratory distress.   Neurological: Alert and oriented. Coordination  normal.  Psychiatric: Mood and affect mildly flat. Behavior is normal. Judgment and thought content normal.     BMET    Component Value Date/Time   NA 139 05/16/2023 1004   NA 142 10/18/2015 1047   K 4.3 05/16/2023 1004   CL 99 05/16/2023 1004   CO2 28 05/16/2023 1004   GLUCOSE 132 05/16/2023 1004   BUN 10 05/16/2023 1004   BUN 6 10/18/2015 1047   CREATININE 0.84 05/16/2023 1004   CALCIUM 8.7 05/16/2023 1004   GFRNONAA >60 02/06/2023 1425   GFRNONAA 70 12/05/2020 0955   GFRAA 81 12/05/2020 0955    Lipid Panel     Component Value Date/Time   CHOL 165 05/16/2023 1004   CHOL 156 10/18/2015 1047   TRIG 69 05/16/2023 1004   HDL 75 05/16/2023 1004   HDL 48 10/18/2015 1047   CHOLHDL 2.2 05/16/2023 1004   LDLCALC 75 05/16/2023 1004    CBC    Component Value Date/Time   WBC 3.8 05/16/2023 1004   RBC 4.69 05/16/2023 1004   HGB 14.7 05/16/2023 1004   HCT 44.1 05/16/2023 1004   PLT 259 05/16/2023 1004   MCV 94.0 05/16/2023 1004   MCH 31.3 05/16/2023 1004   MCHC 33.3 05/16/2023 1004   RDW 11.7 05/16/2023 1004   LYMPHSABS 2.2 02/06/2023 1425   MONOABS 0.4 02/06/2023 1425   EOSABS 0.1 02/06/2023 1425   BASOSABS  0.0 02/06/2023 1425    Hgb A1C Lab Results  Component Value Date   HGBA1C 5.3 05/16/2023       Assessment and Plan:    RTC in 6 months for annual exam  Follow Up Instructions:    I discussed the assessment and treatment plan with the patient. The patient was provided an opportunity to ask questions and all were answered. The patient agreed with the plan and demonstrated an understanding of the instructions.   The patient was advised to call back or seek an in-person evaluation if the symptoms worsen or if the condition fails to improve as anticipated.    Angeline Laura, NP

## 2024-02-27 NOTE — Assessment & Plan Note (Signed)
 Stable on buspirone  10 mg twice daily Support offered

## 2024-02-27 NOTE — Assessment & Plan Note (Signed)
 Encouraged her to avoid foods that trigger reflux Continue omeprazole  20 mg daily as needed

## 2024-02-27 NOTE — Assessment & Plan Note (Signed)
 Encourage low FODMAP diet Continue dicyclomine  10 mg 3 times daily as needed

## 2024-02-27 NOTE — Patient Instructions (Signed)
 GERD in Adults: Diet Changes When you have gastroesophageal reflux disease (GERD), you may need to make changes to your diet. Choosing the right foods can help with your symptoms. Think about working with an expert in healthy eating called a dietitian. They can help you make healthy food choices. What are tips for following this plan? Reading food labels Look for foods that are low in saturated fat. Foods that may help with your symptoms include: Foods with less than 5% of daily value (DV) of fat. Foods with 0 grams of trans fat. Cooking [employer] in ways that don't use a lot of fat. These ways include: Baking. Steaming. Grilling. Broiling. To add flavor, try to use herbs that are low in spice and acidity. Avoid frying your food. Meal planning  Eat small meals often rather than eating 3 large meals each day. Eat your meals slowly in a place where you feel relaxed. If told by your health care provider, avoid: Foods that cause symptoms. Keep a food diary to keep track of foods that cause symptoms. Alcohol. Drinking a lot of liquid with meals. General instructions For 2-3 hours after you eat, avoid: Bending over. Exercise. Lying down. Chew sugar-free gum after meals. What foods should I eat? Eat a healthy diet. Try to include: Foods with high amounts of fiber. These include: Fruits and vegetables. Whole grains and beans. Low-fat dairy products. Lean meats, fish, and poultry. Egg whites. Foods that cause symptoms in someone else may not cause symptoms for you. Work with your provider to find foods that are safe for you. The items listed above may not be all the foods and drinks you can have. Talk with a dietitian to learn more. The items listed above may not be a complete list of foods and beverages you can eat and drink. Contact a dietitian for more information. What foods should I avoid? Limiting some of these foods may help with your symptoms. Each person is different.  Talk with a dietitian or your provider to help you find the exact foods to avoid. Some of the foods to avoid may include: Fruits Fruits with a lot of acid in them. These may include citrus fruits, such as oranges, grapefruit, pineapple, and lemons. Vegetables Deep-fried vegetables, such as [place] fries. Vegetables, sauces, or toppings made with added fat and vegetables with acid in them. These may include tomatoes and tomato products, chili peppers, onions, garlic, and horseradish. Grains Pastries or quick breads with added fat. Meats and other proteins High-fat meats, such as fatty beef or pork, hot dogs, ribs, ham, sausage, salami, and bacon. Fried meat or protein, such as fried fish and fried chicken. Egg yolks. Fats and oils Butter. Margarine. Shortening. Ghee. Drinks Coffee and other drinks with caffeine in them. Fizzy and sugary drinks, such as soda and energy drinks. Fruit juice made with acidic fruits, such as orange or grapefruit. Tomato juice. Sweets and desserts Chocolate and cocoa. Donuts. Seasonings and condiments Mint, such as peppermint and spearmint. Condiments, herbs, or seasonings that cause symptoms. These may include curry, hot sauce, or vinegar-based salad dressings. The items listed above may not be all the foods and drinks you should avoid. Talk with a dietitian to learn more. Questions to ask your health care provider Changes to your diet and everyday life are often the first steps taken to manage symptoms of GERD. If these changes don't help, talk with your provider about taking medicines. Where to find more information International Foundation for Gastrointestinal Disorders:  aboutgerd.org This information is not intended to replace advice given to you by your health care provider. Make sure you discuss any questions you have with your health care provider. Document Revised: 04/23/2023 Document Reviewed: 11/07/2022 Elsevier Patient Education  2024 [employer].

## 2024-03-11 ENCOUNTER — Other Ambulatory Visit: Payer: Self-pay | Admitting: Internal Medicine

## 2024-03-11 DIAGNOSIS — R252 Cramp and spasm: Secondary | ICD-10-CM

## 2024-03-12 NOTE — Telephone Encounter (Signed)
 Requested Prescriptions  Pending Prescriptions Disp Refills   norgestrel -ethinyl estradiol  (LOW-OGESTREL ) 0.3-30 MG-MCG tablet [Pharmacy Med Name: LOW-OGESTREL -28 TABLET] 84 tablet 0    Sig: TAKE 1 TABLET BY MOUTH EVERY DAY     OB/GYN:  Contraceptives Passed - 03/12/2024 11:51 AM      Passed - Last BP in normal range    BP Readings from Last 1 Encounters:  05/16/23 130/84         Passed - Valid encounter within last 12 months    Recent Outpatient Visits           2 weeks ago Gastroesophageal reflux disease without esophagitis   [facility] [place], Angeline ORN, [place]              Passed - Patient is not a smoker

## 2024-03-22 ENCOUNTER — Other Ambulatory Visit: Payer: Self-pay | Admitting: Internal Medicine

## 2024-03-22 DIAGNOSIS — F32A Depression, unspecified: Secondary | ICD-10-CM

## 2024-03-24 NOTE — Telephone Encounter (Signed)
 Requested Prescriptions  Refused Prescriptions Disp Refills   busPIRone  (BUSPAR ) 10 MG tablet [Pharmacy Med Name: BUSPIRONE  HCL 10 MG TABLET] 360 tablet 1    Sig: TAKE 2 TABLETS BY MOUTH 2 TIMES DAILY.     Psychiatry: Anxiolytics/Hypnotics - Non-controlled Passed - 03/24/2024  2:16 PM      Passed - Valid encounter within last 12 months    Recent Outpatient Visits           3 weeks ago Gastroesophageal reflux disease without esophagitis   [place] [facility] [place], Angeline ORN, [place]

## 2024-05-20 ENCOUNTER — Other Ambulatory Visit: Payer: Self-pay | Admitting: Internal Medicine

## 2024-05-20 DIAGNOSIS — F32A Depression, unspecified: Secondary | ICD-10-CM

## 2024-05-22 NOTE — Telephone Encounter (Signed)
 Requested Prescriptions  Pending Prescriptions Disp Refills   busPIRone  (BUSPAR ) 10 MG tablet [Pharmacy Med Name: BUSPIRONE  HCL 10 MG TABLET] 360 tablet 0    Sig: Take 2 tablets (20 mg total) by mouth 2 (two) times daily. OFFICE VISIT NEEDED FOR ADDITIONAL REFILLS     Psychiatry: Anxiolytics/Hypnotics - Non-controlled Passed - 05/22/2024  4:14 PM      Passed - Valid encounter within last 12 months    Recent Outpatient Visits           2 months ago Gastroesophageal reflux disease without esophagitis   [place] [facility] [place], Angeline ORN, [place]

## 2024-06-07 ENCOUNTER — Other Ambulatory Visit: Payer: Self-pay | Admitting: Internal Medicine

## 2024-06-07 DIAGNOSIS — R252 Cramp and spasm: Secondary | ICD-10-CM

## 2024-06-09 NOTE — Telephone Encounter (Signed)
 Requested Prescriptions  Pending Prescriptions Disp Refills   CRYSELLE -28 0.3-30 MG-MCG tablet [Pharmacy Med Name: CRYSELLE -28 TABLET] 84 tablet 0    Sig: TAKE 1 TABLET BY MOUTH EVERY DAY     OB/GYN:  Contraceptives Passed - 06/09/2024  4:22 PM      Passed - Last BP in normal range    BP Readings from Last 1 Encounters:  05/16/23 130/84         Passed - Valid encounter within last 12 months    Recent Outpatient Visits           3 months ago Gastroesophageal reflux disease without esophagitis   [place] [facility] [place], Angeline ORN, [place]              Passed - Patient is not a smoker
